# Patient Record
Sex: Male | Born: 1950
Health system: Southern US, Community
[De-identification: ages and names within clinical notes are randomized; demographics above are authoritative.]

## PROBLEM LIST (undated history)

## (undated) DIAGNOSIS — K2970 Gastritis, unspecified, without bleeding: Secondary | ICD-10-CM

## (undated) DIAGNOSIS — S62102A Fracture of unspecified carpal bone, left wrist, initial encounter for closed fracture: Secondary | ICD-10-CM

## (undated) DIAGNOSIS — R112 Nausea with vomiting, unspecified: Secondary | ICD-10-CM

## (undated) DIAGNOSIS — C4491 Basal cell carcinoma of skin, unspecified: Secondary | ICD-10-CM

## (undated) DIAGNOSIS — Z972 Presence of dental prosthetic device (complete) (partial): Secondary | ICD-10-CM

## (undated) DIAGNOSIS — Z860100 Personal history of colon polyps, unspecified: Secondary | ICD-10-CM

## (undated) DIAGNOSIS — E7800 Pure hypercholesterolemia, unspecified: Secondary | ICD-10-CM

## (undated) DIAGNOSIS — E119 Type 2 diabetes mellitus without complications: Secondary | ICD-10-CM

## (undated) DIAGNOSIS — I10 Essential (primary) hypertension: Secondary | ICD-10-CM

## (undated) DIAGNOSIS — J439 Emphysema, unspecified: Secondary | ICD-10-CM

## (undated) DIAGNOSIS — D179 Benign lipomatous neoplasm, unspecified: Secondary | ICD-10-CM

## (undated) DIAGNOSIS — K219 Gastro-esophageal reflux disease without esophagitis: Secondary | ICD-10-CM

## (undated) DIAGNOSIS — K2990 Gastroduodenitis, unspecified, without bleeding: Secondary | ICD-10-CM

## (undated) DIAGNOSIS — K2270 Barrett's esophagus without dysplasia: Secondary | ICD-10-CM

## (undated) DIAGNOSIS — S68019A Complete traumatic metacarpophalangeal amputation of unspecified thumb, initial encounter: Secondary | ICD-10-CM

## (undated) DIAGNOSIS — Z9889 Other specified postprocedural states: Secondary | ICD-10-CM

## (undated) DIAGNOSIS — F17200 Nicotine dependence, unspecified, uncomplicated: Secondary | ICD-10-CM

## (undated) HISTORY — DX: Essential (primary) hypertension: I10

## (undated) HISTORY — DX: Pure hypercholesterolemia, unspecified: E78.00

## (undated) HISTORY — DX: Basal cell carcinoma of skin, unspecified: C44.91

## (undated) HISTORY — DX: Barrett's esophagus without dysplasia: K22.70

## (undated) HISTORY — DX: Type 2 diabetes mellitus without complications: E11.9

## (undated) HISTORY — DX: Nicotine dependence, unspecified, uncomplicated: F17.200

## (undated) HISTORY — DX: Fracture of unspecified carpal bone, left wrist, initial encounter for closed fracture: S62.102A

## (undated) HISTORY — DX: Gastro-esophageal reflux disease without esophagitis: K21.9

## (undated) HISTORY — DX: Benign lipomatous neoplasm, unspecified: D17.9

## (undated) HISTORY — DX: Gastroduodenitis, unspecified, without bleeding: K29.90

## (undated) HISTORY — PX: CATARACT EXTRACTION: SUR2

## (undated) HISTORY — DX: Gastritis, unspecified, without bleeding: K29.70

## (undated) HISTORY — PX: EYE SURGERY: SHX253

## (undated) HISTORY — DX: Personal history of colon polyps, unspecified: Z86.0100

## (undated) HISTORY — PX: UPPER GI ENDOSCOPY: SHX6162

## (undated) HISTORY — DX: Complete traumatic metacarpophalangeal amputation of unspecified thumb, initial encounter: S68.019A

## (undated) HISTORY — DX: Personal history of colonic polyps: Z86.010

---
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
## 1982-05-10 HISTORY — PX: LAMINECTOMY: SHX219

## 1999-12-09 ENCOUNTER — Encounter: Payer: Self-pay | Admitting: Family Medicine

## 1999-12-09 LAB — CONVERTED CEMR LAB: PSA: 1.1 ng/mL

## 2000-05-10 DIAGNOSIS — C4491 Basal cell carcinoma of skin, unspecified: Secondary | ICD-10-CM

## 2000-05-10 HISTORY — DX: Basal cell carcinoma of skin, unspecified: C44.91

## 2002-07-09 ENCOUNTER — Encounter: Payer: Self-pay | Admitting: Family Medicine

## 2002-07-09 LAB — CONVERTED CEMR LAB: PSA: 0.6 ng/mL

## 2003-08-09 ENCOUNTER — Encounter: Payer: Self-pay | Admitting: Family Medicine

## 2003-08-09 LAB — CONVERTED CEMR LAB: PSA: 0.6 ng/mL

## 2004-07-16 ENCOUNTER — Ambulatory Visit: Payer: Self-pay | Admitting: Family Medicine

## 2004-10-08 ENCOUNTER — Encounter: Payer: Self-pay | Admitting: Family Medicine

## 2004-10-08 LAB — CONVERTED CEMR LAB: PSA: 0.86 ng/mL

## 2004-11-03 ENCOUNTER — Ambulatory Visit: Payer: Self-pay | Admitting: Family Medicine

## 2004-11-06 ENCOUNTER — Ambulatory Visit: Payer: Self-pay | Admitting: Family Medicine

## 2005-02-15 ENCOUNTER — Ambulatory Visit: Payer: Self-pay | Admitting: Unknown Physician Specialty

## 2006-01-17 ENCOUNTER — Ambulatory Visit: Payer: Self-pay | Admitting: Family Medicine

## 2006-01-21 ENCOUNTER — Ambulatory Visit: Payer: Self-pay | Admitting: Family Medicine

## 2006-02-09 ENCOUNTER — Ambulatory Visit: Payer: Self-pay | Admitting: Family Medicine

## 2006-02-25 ENCOUNTER — Ambulatory Visit: Payer: Self-pay | Admitting: Family Medicine

## 2006-11-08 ENCOUNTER — Ambulatory Visit: Payer: Self-pay | Admitting: Family Medicine

## 2006-11-08 DIAGNOSIS — R0789 Other chest pain: Secondary | ICD-10-CM | POA: Insufficient documentation

## 2006-11-08 DIAGNOSIS — K219 Gastro-esophageal reflux disease without esophagitis: Secondary | ICD-10-CM | POA: Insufficient documentation

## 2006-12-07 ENCOUNTER — Ambulatory Visit: Payer: Self-pay | Admitting: Unknown Physician Specialty

## 2006-12-20 ENCOUNTER — Ambulatory Visit: Payer: Self-pay | Admitting: General Surgery

## 2006-12-20 HISTORY — PX: CHOLECYSTECTOMY, LAPAROSCOPIC: SHX56

## 2007-01-16 ENCOUNTER — Encounter: Payer: Self-pay | Admitting: Family Medicine

## 2007-01-16 DIAGNOSIS — F17200 Nicotine dependence, unspecified, uncomplicated: Secondary | ICD-10-CM | POA: Insufficient documentation

## 2007-01-16 DIAGNOSIS — Z860100 Personal history of colon polyps, unspecified: Secondary | ICD-10-CM | POA: Insufficient documentation

## 2007-01-23 ENCOUNTER — Ambulatory Visit: Payer: Self-pay | Admitting: Family Medicine

## 2007-01-23 LAB — CONVERTED CEMR LAB
Albumin: 3.8 g/dL (ref 3.5–5.2)
Basophils Absolute: 0.1 10*3/uL (ref 0.0–0.1)
CO2: 29 meq/L (ref 19–32)
Cholesterol: 177 mg/dL (ref 0–200)
Creatinine, Ser: 1.2 mg/dL (ref 0.4–1.5)
GFR calc Af Amer: 80 mL/min
HCT: 42.4 % (ref 39.0–52.0)
Hemoglobin: 14.4 g/dL (ref 13.0–17.0)
LDL Cholesterol: 110 mg/dL — ABNORMAL HIGH (ref 0–99)
Lymphocytes Relative: 30.9 % (ref 12.0–46.0)
MCHC: 33.9 g/dL (ref 30.0–36.0)
Monocytes Absolute: 0.7 10*3/uL (ref 0.2–0.7)
Neutro Abs: 4.8 10*3/uL (ref 1.4–7.7)
Neutrophils Relative %: 54.7 % (ref 43.0–77.0)
PSA: 0.72 ng/mL (ref 0.10–4.00)
Potassium: 4.2 meq/L (ref 3.5–5.1)
RDW: 12.2 % (ref 11.5–14.6)
Sodium: 142 meq/L (ref 135–145)
TSH: 1.78 microintl units/mL (ref 0.35–5.50)
Total Bilirubin: 0.9 mg/dL (ref 0.3–1.2)
Total Protein: 6.5 g/dL (ref 6.0–8.3)

## 2007-01-25 ENCOUNTER — Ambulatory Visit: Payer: Self-pay | Admitting: Family Medicine

## 2007-02-13 ENCOUNTER — Ambulatory Visit: Payer: Self-pay | Admitting: Family Medicine

## 2007-02-14 ENCOUNTER — Encounter (INDEPENDENT_AMBULATORY_CARE_PROVIDER_SITE_OTHER): Payer: Self-pay | Admitting: *Deleted

## 2007-02-16 ENCOUNTER — Encounter: Payer: Self-pay | Admitting: Family Medicine

## 2007-05-01 ENCOUNTER — Telehealth: Payer: Self-pay | Admitting: Family Medicine

## 2007-05-02 ENCOUNTER — Ambulatory Visit: Payer: Self-pay | Admitting: Family Medicine

## 2007-05-02 DIAGNOSIS — Z8679 Personal history of other diseases of the circulatory system: Secondary | ICD-10-CM | POA: Insufficient documentation

## 2007-05-24 ENCOUNTER — Ambulatory Visit: Payer: Self-pay | Admitting: Family Medicine

## 2007-05-24 LAB — CONVERTED CEMR LAB
BUN: 17 mg/dL
CO2: 30 meq/L
Calcium: 9.1 mg/dL
Chloride: 104 meq/L
Creatinine, Ser: 1.3 mg/dL
GFR calc Af Amer: 73 mL/min
GFR calc non Af Amer: 60 mL/min
Glucose, Bld: 118 mg/dL — ABNORMAL HIGH
Potassium: 4.4 meq/L
Sodium: 139 meq/L

## 2007-06-08 ENCOUNTER — Ambulatory Visit: Payer: Self-pay | Admitting: Family Medicine

## 2007-06-08 LAB — CONVERTED CEMR LAB: BUN: 21 mg/dL (ref 6–23)

## 2007-06-14 ENCOUNTER — Ambulatory Visit: Payer: Self-pay | Admitting: Family Medicine

## 2007-09-22 ENCOUNTER — Encounter: Payer: Self-pay | Admitting: Family Medicine

## 2007-09-22 ENCOUNTER — Ambulatory Visit: Payer: Self-pay | Admitting: Unknown Physician Specialty

## 2007-09-22 LAB — HM COLONOSCOPY

## 2007-09-25 ENCOUNTER — Encounter: Payer: Self-pay | Admitting: Family Medicine

## 2008-01-25 ENCOUNTER — Ambulatory Visit: Payer: Self-pay | Admitting: Family Medicine

## 2008-01-25 LAB — CONVERTED CEMR LAB
ALT: 27 units/L (ref 0–53)
Basophils Relative: 0.6 % (ref 0.0–3.0)
Bilirubin, Direct: 0.1 mg/dL (ref 0.0–0.3)
CO2: 30 meq/L (ref 19–32)
Calcium: 9.1 mg/dL (ref 8.4–10.5)
Glucose, Bld: 118 mg/dL — ABNORMAL HIGH (ref 70–99)
Hemoglobin: 13.6 g/dL (ref 13.0–17.0)
LDL Cholesterol: 72 mg/dL (ref 0–99)
Lymphocytes Relative: 35.3 % (ref 12.0–46.0)
Microalb, Ur: 0.2 mg/dL (ref 0.0–1.9)
Monocytes Relative: 7.8 % (ref 3.0–12.0)
Neutro Abs: 3.6 10*3/uL (ref 1.4–7.7)
RBC: 4.21 M/uL — ABNORMAL LOW (ref 4.22–5.81)
RDW: 12.7 % (ref 11.5–14.6)
Sodium: 142 meq/L (ref 135–145)
Total CHOL/HDL Ratio: 3
Total Protein: 6.5 g/dL (ref 6.0–8.3)
WBC: 6.7 10*3/uL (ref 4.5–10.5)

## 2008-01-29 ENCOUNTER — Ambulatory Visit: Payer: Self-pay | Admitting: Family Medicine

## 2008-07-24 ENCOUNTER — Ambulatory Visit: Payer: Self-pay | Admitting: Family Medicine

## 2008-07-24 LAB — CONVERTED CEMR LAB
CO2: 32 meq/L (ref 19–32)
Calcium: 8.9 mg/dL (ref 8.4–10.5)
Chloride: 105 meq/L (ref 96–112)
Glucose, Bld: 112 mg/dL — ABNORMAL HIGH (ref 70–99)
Potassium: 4.9 meq/L (ref 3.5–5.1)
Sodium: 141 meq/L (ref 135–145)

## 2008-07-29 ENCOUNTER — Ambulatory Visit: Payer: Self-pay | Admitting: Family Medicine

## 2009-01-27 ENCOUNTER — Ambulatory Visit: Payer: Self-pay | Admitting: Family Medicine

## 2009-01-27 LAB — CONVERTED CEMR LAB
BUN: 18 mg/dL (ref 6–23)
Bilirubin, Direct: 0.1 mg/dL (ref 0.0–0.3)
CO2: 29 meq/L (ref 19–32)
Chloride: 108 meq/L (ref 96–112)
Cholesterol: 146 mg/dL (ref 0–200)
Creatinine, Ser: 1.3 mg/dL (ref 0.4–1.5)
Creatinine,U: 99.5 mg/dL
Eosinophils Absolute: 0.2 10*3/uL (ref 0.0–0.7)
Glucose, Bld: 124 mg/dL — ABNORMAL HIGH (ref 70–99)
HDL: 37.7 mg/dL — ABNORMAL LOW (ref >39.00)
LDL Cholesterol: 89 mg/dL (ref 0–99)
Lymphs Abs: 2.4 10*3/uL (ref 0.7–4.0)
MCHC: 33.3 g/dL (ref 30.0–36.0)
MCV: 96.1 fL (ref 78.0–100.0)
Monocytes Absolute: 0.6 10*3/uL (ref 0.1–1.0)
Neutrophils Relative %: 56.6 % (ref 43.0–77.0)
Platelets: 170 10*3/uL (ref 150.0–400.0)
RDW: 12.6 % (ref 11.5–14.6)
Total Bilirubin: 0.9 mg/dL (ref 0.3–1.2)
Total CHOL/HDL Ratio: 4
Triglycerides: 97 mg/dL (ref 0.0–149.0)

## 2009-01-29 ENCOUNTER — Ambulatory Visit: Payer: Self-pay | Admitting: Family Medicine

## 2009-07-16 ENCOUNTER — Ambulatory Visit: Payer: Self-pay | Admitting: Family Medicine

## 2009-07-16 LAB — CONVERTED CEMR LAB: Glucose, Bld: 121 mg/dL — ABNORMAL HIGH (ref 70–99)

## 2009-07-21 ENCOUNTER — Ambulatory Visit: Payer: Self-pay | Admitting: Family Medicine

## 2009-12-11 ENCOUNTER — Encounter (INDEPENDENT_AMBULATORY_CARE_PROVIDER_SITE_OTHER): Payer: Self-pay | Admitting: *Deleted

## 2010-01-20 ENCOUNTER — Telehealth (INDEPENDENT_AMBULATORY_CARE_PROVIDER_SITE_OTHER): Payer: Self-pay | Admitting: *Deleted

## 2010-01-22 ENCOUNTER — Ambulatory Visit: Payer: Self-pay | Admitting: Family Medicine

## 2010-01-22 DIAGNOSIS — R5381 Other malaise: Secondary | ICD-10-CM | POA: Insufficient documentation

## 2010-01-22 DIAGNOSIS — R5383 Other fatigue: Secondary | ICD-10-CM

## 2010-01-23 LAB — CONVERTED CEMR LAB
AST: 22 units/L (ref 0–37)
Albumin: 4 g/dL (ref 3.5–5.2)
Alkaline Phosphatase: 39 units/L (ref 39–117)
Basophils Relative: 0.5 % (ref 0.0–3.0)
Bilirubin, Direct: 0.2 mg/dL (ref 0.0–0.3)
Calcium: 9.1 mg/dL (ref 8.4–10.5)
Eosinophils Relative: 2.4 % (ref 0.0–5.0)
GFR calc non Af Amer: 67.42 mL/min (ref >60)
Glucose, Bld: 109 mg/dL — ABNORMAL HIGH (ref 70–99)
HDL: 32.9 mg/dL — ABNORMAL LOW (ref >39.00)
LDL Cholesterol: 95 mg/dL (ref 0–99)
Lymphocytes Relative: 30 % (ref 12.0–46.0)
MCV: 94.7 fL (ref 78.0–100.0)
Monocytes Absolute: 0.8 10*3/uL (ref 0.1–1.0)
Monocytes Relative: 9.9 % (ref 3.0–12.0)
Neutrophils Relative %: 57.2 % (ref 43.0–77.0)
PSA: 0.82 ng/mL (ref 0.10–4.00)
Platelets: 170 10*3/uL (ref 150.0–400.0)
Potassium: 4.9 meq/L (ref 3.5–5.1)
RBC: 4.45 M/uL (ref 4.22–5.81)
Sodium: 143 meq/L (ref 135–145)
TSH: 1.27 microintl units/mL (ref 0.35–5.50)
Total CHOL/HDL Ratio: 4
VLDL: 12.4 mg/dL (ref 0.0–40.0)
WBC: 7.9 10*3/uL (ref 4.5–10.5)

## 2010-02-02 ENCOUNTER — Ambulatory Visit: Payer: Self-pay | Admitting: Family Medicine

## 2010-04-06 ENCOUNTER — Ambulatory Visit: Payer: Self-pay | Admitting: Family Medicine

## 2010-04-06 DIAGNOSIS — R05 Cough: Secondary | ICD-10-CM

## 2010-04-06 DIAGNOSIS — R059 Cough, unspecified: Secondary | ICD-10-CM | POA: Insufficient documentation

## 2010-04-10 ENCOUNTER — Telehealth: Payer: Self-pay | Admitting: Family Medicine

## 2010-04-20 ENCOUNTER — Telehealth: Payer: Self-pay | Admitting: Family Medicine

## 2010-06-09 NOTE — Progress Notes (Signed)
Summary: has bad cough  Phone Note Call from Patient Call back at Home Phone [phone]   Caller: Spouse Call For: Crawford Givens MD Summary of Call: Pt was seen for URI and given z-pack.  He wil finish that today.  He still has a low grade fever and is coughing badly at night.  I advised his wife that the zithromax will continue to work in his system for a few more days and he can expect the cough to linger for awhile.  I suggested that they give it the weekend and if he is not any better and continues to have fevers to call back on monday.  Wife is asking if something can be called in for his cough, uses [facility] in [place]. Initial call taken by: Lowella Petties CMA, AAMA,  April 10, 2010 10:09 AM  Follow-up for Phone Call        please call in hycodan/homatropine 5ml by mouth q6h as needed cough, sedation caution.  #6oz, no rf.  Follow-up by: Crawford Givens MD,  April 10, 2010 1:54 PM  Additional Follow-up for Phone Call Additional follow up Details #1::        Medication phoned to pharmacy. Patient Advised.  Additional Follow-up by: Delilah Shan CMA (AAMA),  April 10, 2010 3:55 PM    New/Updated Medications: HYDROMET 5-1.5 MG/5ML SYRP (HYDROCODONE-HOMATROPINE) 5ml by mouth every 6 hours  as needed for cough. ( sedation caution.) Prescriptions: HYDROMET 5-1.5 MG/5ML SYRP (HYDROCODONE-HOMATROPINE) 5ml by mouth every 6 hours  as needed for cough. ( sedation caution.)  #6 oz. x 0   Entered by:   Delilah Shan CMA (AAMA)   Authorized by:   Crawford Givens MD   Signed by:   Delilah Shan CMA (AAMA) on 04/10/2010   Method used:   Handwritten   RxID:   [id number]

## 2010-06-09 NOTE — Letter (Signed)
Summary: Nadara Eaton letter  Brian Hull at [facility]  [address] [place], [place] [address]   Phone: [phone]  Fax: [phone]       12/11/2009 MRN: [id number]  Brian Hull [address] [place], [place]  [address]  Dear [name] [facility] [place] [place] announce the retirement of Brian Hull, M.D., from full-time practice at the [facility] office effective November 06, 2009 and his plans of returning part-time.  It is important to Dr. Hetty Hull and to our practice that you understand that [facility] has seven physicians in our office for your health care needs.  We will continue to offer the same exceptional care that you have today.    Dr. Hetty Hull has spoken to many of you about his plans for retirement and returning part-time in the fall.   We will continue to work with you through the transition to schedule appointments for you in the office and meet the high standards that [facility] is committed to.   Again, it is with great pleasure that we share the news that Dr. Hetty Hull will return to [facility] at [facility] in October of 2011 with a reduced schedule.    If you have any questions, or would like to request an appointment with one of our physicians, please call us at [phone] and press the option for Scheduling an appointment.  We take pleasure in providing you with excellent patient care and look forward to seeing you at your next office visit.  Our [facility] Physicians are:  Brian Hull, M.D. Brian Hull, M.D. Brian Hull, M.D. Brian Hull, M.D. Brian Hull, M.D. Brian Hull, M.D. We proudly welcomed Brian Hull, M.D. and Brian Hull, M.D. to the practice in July/August 2011.  Sincerely,  [place] [facility]

## 2010-06-09 NOTE — Progress Notes (Signed)
----   Converted from flag ---- ---- 01/19/2010 12:16 PM, Hannah Beat MD wrote: Prephysical Labs, several days before, fasting BMP, HFP, FLP, CBC with diff, TSH, PSA: v77.91, v77.1, ,780.79, v76.44   ---- 01/19/2010 11:32 AM, Liane Comber CMA (AAMA) wrote: Lab orders please! Good Morning! This pt is scheduled for cpx labs [facility], which labs to draw and dx codes to use? Thanks Tasha ------------------------------

## 2010-06-09 NOTE — Assessment & Plan Note (Signed)
Summary: CPX... CYD   Vital Signs:  Patient profile:   60 year old male Height:      71 inches Weight:      213.0 pounds BMI:     29.81 Temp:     98.0 degrees F oral Pulse rate:   84 / minute Pulse rhythm:   regular BP sitting:   138 / 80  (left arm) Cuff size:   large  Vitals Entered By: Benny Lennert CMA Duncan Dull) (February 02, 2010 2:39 PM)  History of Present Illness: Chief complaint cpx  60 year old male:  Flu     Preventive Screening-Counseling & Management  Alcohol-Tobacco     Alcohol drinks/day: 2     Alcohol type: wine at night     Smoking Status: current     Smoking Cessation Counseling: yes     Packs/Day: 3/4     Year Started: 1965     Cans of tobacco/week: no     Passive Smoke Exposure: no  Caffeine-Diet-Exercise     Caffeine use/day: 4     Does Patient Exercise: no     Type of exercise: works around [employer], no gym  Hep-HIV-STD-Contraception     HIV Risk: no  Safety-Violence-Falls     Risk analyst Use: 100      Drug Use:  no.    Clinical Review Panels:  Prevention   Last Colonoscopy:  Adenomatous Polyp (09/22/2007)   Last PSA:  0.82 (01/22/2010)  Immunizations   Last Tetanus Booster:  Tdap (01/25/2007)  Lipid Management   Cholesterol:  140 (01/22/2010)   LDL (bad choesterol):  95 (01/22/2010)   HDL (good cholesterol):  32.90 (01/22/2010)  Diabetes Management   HgBA1C:  5.9 (11/03/2004)   Creatinine:  1.2 (01/22/2010)  CBC   WBC:  7.9 (01/22/2010)   RBC:  4.45 (01/22/2010)   Hgb:  14.3 (01/22/2010)   Hct:  42.1 (01/22/2010)   Platelets:  170.0 (01/22/2010)   MCV  94.7 (01/22/2010)   MCHC  33.9 (01/22/2010)   RDW  13.2 (01/22/2010)   PMN:  57.2 (01/22/2010)   Lymphs:  30.0 (01/22/2010)   Monos:  9.9 (01/22/2010)   Eosinophils:  2.4 (01/22/2010)   Basophil:  0.5 (01/22/2010)  Complete Metabolic Panel   Glucose:  109 (01/22/2010)   Sodium:  143 (01/22/2010)   Potassium:  4.9 (01/22/2010)   Chloride:  106 (01/22/2010)  CO2:  30 (01/22/2010)   BUN:  17 (01/22/2010)   Creatinine:  1.2 (01/22/2010)   Albumin:  4.0 (01/22/2010)   Total Protein:  6.6 (01/22/2010)   Calcium:  9.1 (01/22/2010)   Total Bili:  0.4 (01/22/2010)   Alk Phos:  39 (01/22/2010)   SGPT (ALT):  27 (01/22/2010)   SGOT (AST):  22 (01/22/2010)   Allergies (verified): No Known Drug Allergies  Past History:  Past medical, surgical, family and social histories (including risk factors) reviewed, and no changes noted (except as noted below).  Past Medical History: Reviewed history from 01/16/2007 and no changes required. Colonic polyps, hx of  Past Surgical History: Reviewed history from 01/29/2008 and no changes required. Surgery for trauma to R eye  as child  growth removed from eye 2 yrs later L wrist fx as child Tip of R Thumb traumatically amputated 1968 Laminectomy L4/5 1984 Basal Cell of Nose 05/2000 Colonoscopy Marina Goodell) 12/00 Colonoscopoy polyps, int hems  Mechele Collin) 05/08/02 EGD, Barrett's Esoph, H. H., gastritis/divertics 05/08/02 Colonoscopy, polyps, , int hems 02/15/05 EGD Barrett's, sm H. H., gastritis,  wnl duod  02/15/05 (2009) Cholecystectomy, lap Okey Dupre) 12/20/06 Colonoscopy Polyps Int Hemms (Dr Mechele Collin) 09/22/2007    3 years. EGD ?Barrett's Esoph HH Gastritis (Dr Mechele Collin) 09/22/2007  Family History: Reviewed history from 01/29/2009 and no changes required. Father: dec 74 with colon cancer, DM, CHF, stroke, and hypertension Mother: A 4  DM, heart disease MI, Parkinson's, dizziness, and Alzheimers progressing  Brother dec 59 Lung Ca (smoker) DM  and polyps Sister A 62   HTN  polyps Sister A 60   HTN  polyps CV + Father, CHF HBP + Sister, Father DM + Mother, Brother, Father Colon Canc er + Father Depression - negative ETOH/Drug abuse - negative Stroke + Father  Social History: Reviewed history from 01/25/2007 and no changes required. 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  Cardiovascular: Denies chest pains, palpitations, syncope, dyspnea on exertion,  Respiratory: Denies cough, dyspnea at rest, excessive sputum,wheeezing GI: Denies nausea, vomiting, diarrhea, constipation, change in bowel habits, abdominal pain, melena, hematochezia GU: Denies dysuria, hematuria, discharge, urinary frequency, urinary hesitancy, nocturia, incontinence, genital sores, decreased libido - BACK AND FLANK PAIN A FEW DAYS AGO, BUT ONLY LASTED A FEW DAYS Musculoskeletal: Denies back pain, joint pain Derm: Denies rash, itching Neuro: Denies  paresthesias, frequent falls, frequent headaches, and difficulty walking.  Psych: Denies depression, anxiety Endocrine: Denies cold intolerance, heat intolerance, polydipsia, polyphagia, polyuria, and unusual weight change.  Heme: Denies enlarged lymph nodes Allergy: No hayfever   Otherwise, the pertinent positives and negatives are listed above and in the HPI, otherwise a full review of systems has been reviewed and is negative unless noted positive.   Physical Exam  General:  Well-developed,well-nourished,in no acute distress; alert,appropriate and cooperative throughout examination Head:  Normocephalic and atraumatic without obvious abnormalities. No apparent alopecia or balding. Eyes:  pupils equal, pupils round, and pupils reactive to light.   Ears:  External ear exam shows no significant lesions or deformities.  Otoscopic examination reveals clear canals, tympanic membranes are intact bilaterally without bulging, retraction, inflammation or discharge. Hearing is grossly normal bilaterally. Nose:  External nasal examination shows no deformity or inflammation. Nasal  mucosa are pink and moist without lesions or exudates. Mouth:  no gingival abnormalities and pharynx pink and moist.   Neck:  No deformities, masses, or tenderness noted. Chest Wall:  No deformities, masses, tenderness or gynecomastia noted. Lungs:  Normal respiratory effort, chest expands symmetrically. Lungs are clear to auscultation, no crackles or wheezes. Heart:  Normal rate and regular rhythm. S1 and S2 normal without gallop, murmur, click, rub or other extra sounds. Abdomen:  Bowel sounds positive,abdomen soft and non-tender without masses, organomegaly or hernias noted. Rectal:  No external abnormalities noted. Normal sphincter tone. No rectal masses or tenderness. Genitalia:  Testes bilaterally descended without nodularity, tenderness or masses. No scrotal masses or lesions. No penis lesions or urethral discharge. Prostate:  Prostate gland firm and smooth, no enlargement, nodularity, tenderness, mass, asymmetry or induration. Msk:  normal ROM.   Extremities:  No clubbing, cyanosis, edema, or deformity noted with normal full range of motion of all joints.   Neurologic:  alert & oriented X3 and gait normal.   Skin:  Intact without suspicious lesions or rashes Cervical Nodes:  No lymphadenopathy noted  Psych:  Cognition and judgment appear intact. Alert and cooperative with normal attention span and concentration. No apparent delusions, illusions, hallucinations   Impression & Recommendations:  Problem # 1:  HEALTH MAINTENANCE EXAM (ICD-V70.0) The patient's preventative maintenance and recommended screening tests for an annual wellness exam were reviewed in full today. Brought up to date unless services declined.  Counselled on the importance of diet, exercise, and its role in overall health and mortality. The patient's FH and SH was reviewed, including their home life, tobacco status, and drug and alcohol status.   doing well  The patient does smoke cigarettes, and we discussed that  tobacco is harmful to one's overall health, and that likely quitting smoking would be the single best thing that they could do for their health.   cont f/u Dr. Hetty Ely for longitudinal care  Complete Medication List: 1)  Protonix 40 Mg Tbec (Pantoprazole sodium) .Marland Kitchen.. 1 tab by mouth daily 2)  Lisinopril 20 Mg Tabs (Lisinopril) .... One tab by mouth at night.  Contraindications/Deferment of Procedures/Staging:    Test/Procedure: FLU VAX    Reason for deferment: patient declined   Current Allergies (reviewed today): No known allergies     Prevention & Chronic Care Immunizations   Influenza vaccine: Not documented   Influenza vaccine deferral: patient declined  (02/02/2010)    Tetanus booster: 01/25/2007: Tdap   Tetanus booster due: 01/24/2017    Pneumococcal vaccine: Not documented    H. zoster vaccine: Not documented  Colorectal Screening   Hemoccult: Negative  (02/04/2006)   Hemoccult due: 02/05/2007    Colonoscopy: Adenomatous Polyp  (09/22/2007)   Colonoscopy due: 09/22/2010  Other Screening   PSA: 0.82  (01/22/2010)   PSA due due: 01/23/2011   Smoking status: current  (02/02/2010)   Smoking cessation counseling: yes  (02/02/2010)  Lipids   Total Cholesterol: 140  (01/22/2010)   LDL: 95  (01/22/2010)   LDL Direct: Not documented   HDL: 32.90  (01/22/2010)   Triglycerides: 62.0  (01/22/2010)    SGOT (AST): 22  (01/22/2010)   SGPT (ALT): 27  (01/22/2010)   Alkaline phosphatase: 39  (01/22/2010)   Total bilirubin: 0.4  (01/22/2010)    Lipid flowsheet reviewed?: Yes   Progress toward LDL goal: At goal  Hypertension   Last Blood Pressure: 138 / 80  (02/02/2010)   Serum creatinine: 1.2  (01/22/2010)   Serum potassium 4.9  (01/22/2010)    Hypertension flowsheet reviewed?: Yes   Progress toward BP goal: At goal

## 2010-06-09 NOTE — Assessment & Plan Note (Signed)
Summary: RTC 6 MOS, AC   Vital Signs:  Patient profile:   60 year old male Weight:      226 pounds BMI:     31.63 Temp:     98.1 degrees F oral Pulse rate:   84 / minute Pulse rhythm:   regular BP sitting:   118 / 72  (left arm) Cuff size:   large  Vitals Entered By: Sydell Axon LPN (July 21, 2009 10:09 AM) CC: 6 Month follow-up after lab work   History of Present Illness: Pt here for 6 month followup, has elevasted sugar and has been encouraged to watch his diet. He is here to check on success of same and ability to cut down on smoking and chewing. He has not been very successful at any of this and knows what he needs to do, it is getting his mind around it and doing it that is hard. He also complains of a superficial; swelling ove rthe right mid back area, over the lower ribs posteriorally. He feels well otherwise.  Problems Prior to Update: 1)  Essential Hypertension, Benign  (ICD-401.1) 2)  Screening For Malignannt Neoplasm, Site Nec  (ICD-V76.49) 3)  Health Maintenance Exam  (ICD-V70.0) 4)  Screening For Malignant Neoplasm, Prostate  (ICD-V76.44) 5)  Gastritis,esophagitis Via Egd  (ICD-535.50) 6)  Hypercholesterolemia, Deceased Hdl, Ldl 101, Hdl 31.5  (ICD-272.0) 7)  Hyperglycemia, 116  (ICD-790.29) 8)  Barrett's Esophagus Via Egd  (ICD-530.85) 9)  Nicotine Addiction  (ICD-305.1) 10)  Colonic Polyps, Hx of  (ICD-V12.72) 11)  Gerd, Severe  (ICD-530.81) 12)  Chest Pain, Atypical  (ICD-786.59)  Medications Prior to Update: 1)  Protonix 40 Mg  Tbec (Pantoprazole Sodium) .Marland Kitchen.. 1 Tab By Mouth Daily 2)  Lisinopril 20 Mg Tabs (Lisinopril) .... One Tab By Mouth At Night.  Allergies: No Known Drug Allergies  Physical Exam  General:  Well-developed,well-nourished,in no acute distress; alert,appropriate and cooperative throughout examination, mildly heavy. Head:  Normocephalic and atraumatic without obvious abnormalities. No apparent alopecia or balding. Eyes:  Conjunctiva  clear bilaterally.  Ears:  External ear exam shows no significant lesions or deformities.  Otoscopic examination reveals clear canals, tympanic membranes are intact bilaterally without bulging, retraction, inflammation or discharge. Hearing is grossly normal bilaterally. Nose:  External nasal examination shows no deformity or inflammation. Nasal mucosa are pink and moist without lesions or exudates. Mouth:  Oral mucosa and oropharynx without lesions or exudates.  Teeth in good repair. Neck:  No deformities, masses, or tenderness noted. Lungs:  Normal respiratory effort, chest expands symmetrically. Lungs are clear to auscultation, no crackles, very fine occas wheezes. Heart:  Normal rate and regular rhythm. S1 and S2 normal without gallop, murmur, click, rub or other extra sounds. Msk:  10-15cm soft swelling over the right lower ribs posteriorally, mobile and nontender unless he is stretching.   Impression & Recommendations:  Problem # 1:  LIPOMA, RIGHT POSTERIOR LOWER CHEST. (ICD-214.9) Assessment New Declines surgiccal referral at present . Will return if enlarges.  Problem # 2:  ESSENTIAL HYPERTENSION, BENIGN (ICD-401.1) Assessment: Unchanged Stable. His updated medication list for this problem includes:    Lisinopril 20 Mg Tabs (Lisinopril) ..... One tab by mouth at night.  BP today: 118/72 Prior BP: 110/64 (01/29/2009)  Labs Reviewed: K+: 4.8 (01/27/2009) Creat: : 1.3 (01/27/2009)   Chol: 146 (01/27/2009)   HDL: 37.70 (01/27/2009)   LDL: 89 (01/27/2009)   TG: 97.0 (01/27/2009)  Problem # 3:  HYPERGLYCEMIA, 116 (ICD-790.29) Assessment: Unchanged Stable but  significant risk of accelerating to DM. Discussed risks of dz. Labs Reviewed: Creat: 1.3 (01/27/2009)     Problem # 4:  NICOTINE ADDICTION (ICD-305.1) Assessment: Unchanged Disscussed need to quit and interaction of smoking and DM.  Complete Medication List: 1)  Protonix 40 Mg Tbec (Pantoprazole sodium) .Marland Kitchen.. 1 tab by  mouth daily 2)  Lisinopril 20 Mg Tabs (Lisinopril) .... One tab by mouth at night.  Patient Instructions: 1)  Will return for lipoma/cyst of right middle back.

## 2010-06-09 NOTE — Assessment & Plan Note (Signed)
Summary: COUGH,CONGESTION/ALC   Vital Signs:  Patient profile:   60 year old male Height:      71 inches Weight:      213.25 pounds BMI:     29.85 Temp:     98.3 degrees F oral Pulse rate:   92 / minute Pulse rhythm:   regular BP sitting:   104 / 68  (left arm) Cuff size:   large  Vitals Entered By: Delilah Shan CMA Duncan Dull) (April 06, 2010 12:14 PM) CC: Cough, congestion, fever   History of Present Illness: Sx started 8 days ago.  Progressively felt worse.  Cough, rhinorrhea, cougestion in chest (h/o likely early COPD per patient- had "flunked" spirometry with fire department).  temp 101 yesterday.  Dec in appetite and weight over the last few days.  HA.  +Sick contacts before symptoms started.  Post-tussive emesis.   Allergies: No Known Drug Allergies  Past History:  Past Medical History: COUGH (ICD-786.2) FATIGUE (ICD-780.79) SCREENING FOR LIPOID DISORDERS (ICD-V77.91) SCREENING, DIABETES MELLITUS (ICD-V77.1) LIPOMA, RIGHT POSTERIOR LOWER CHEST. (ICD-214.9) ESSENTIAL HYPERTENSION, BENIGN (ICD-401.1) SCREENING FOR MALIGNANNT NEOPLASM, SITE NEC (ICD-V76.49) HEALTH MAINTENANCE EXAM (ICD-V70.0) SCREENING FOR MALIGNANT NEOPLASM, PROSTATE (ICD-V76.44) GASTRITIS,ESOPHAGITIS VIA EGD (ICD-535.50) HYPERCHOLESTEROLEMIA, DECEASED HDL, LDL 101, HDL 31.5 (ICD-272.0) HYPERGLYCEMIA, 116 (ICD-790.29) BARRETT'S ESOPHAGUS VIA EGD (ICD-530.85) NICOTINE ADDICTION (ICD-305.1) COLONIC POLYPS, HX OF (ICD-V12.72) GERD, SEVERE (ICD-530.81) CHEST PAIN, ATYPICAL (ICD-786.59)  Social History: Reviewed history from 01/25/2007 and no changes required. 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 smoker as of 2011 (since 64)  Physical Exam  General:  GEN: nad, alert and oriented HEENT: mucous membranes moist, TM w/o erythema, nasal epithelium injected, OP with mild  cobblestoning NECK: supple w/o LA CV: rrr. PULM:  no inc wob but diffuse wheeze and ronchi, both improved with SABA use here in clinic.    Impression & Recommendations:  Problem # 1:  COUGH (ICD-786.2) Likely acute bronchitis/COPD exacerbation.  Would use antibiotics today and continue with SABA.  Samples of SABA given with routine teaching.  Pt felt better after SABA use.  His wheeze was much improved and he may not need steroids in the meantime.  I talked with him about smoking.  Fu as needed.  Routine supportive tx o/w.  He agrees.  Complete Medication List: 1)  Protonix 40 Mg Tbec (Pantoprazole sodium) .Marland Kitchen.. 1 tab by mouth daily 2)  Lisinopril 20 Mg Tabs (Lisinopril) .... One tab by mouth at night. 3)  Ventolin Hfa 108 (90 Base) Mcg/act Aers (Albuterol sulfate) .... 2 puffs q4h as needed cough 4)  Zithromax 250 Mg Tabs (Azithromycin) .... 2 by mouth today and then 1 by mouth once daily for 4 days.  Patient Instructions: 1)  Use the inhaler every 4 hours as needed. Start the antibiotics today and try to get some rest.  Take the mucinex and cough syrup as needed.  Drink plenty of fluids.  Let us know if you aren't getting better.   Prescriptions: ZITHROMAX 250 MG TABS (AZITHROMYCIN) 2 by mouth today and then 1 by mouth once daily for 4 days.  #6 x 0   Entered and Authorized by:   Crawford Givens MD   Signed by:   Crawford Givens MD on 04/06/2010   Method used:   Electronically to        [facility] (retail)       [address]  [place], [place]  [address]       Ph: [phone]       Fax: [phone]   RxID:   [id number]    Orders Added: 1)  Est. Patient Level III [401[address]]    Current Allergies (reviewed today): No known allergies

## 2010-06-11 NOTE — Progress Notes (Signed)
Summary: Still has cough  Phone Note Call from Patient Call back at Home Phone [phone]   Caller: Spouse Call For: Crawford Givens MD Summary of Call: Patient was seen about 2 weeks ago and was given medication. Patient still has a productive cough/white-clear and head congestion with some blood in the mucus from the left nostril, no fever, some SOB,  but patient had this before getting sick because of his COPD.Patient is having a hard time sleeping because of  the cough.  Patient wants to know if he should be seen again or can you call in more medication/antibiotic?   Pharmacy-[facility]/[place]. Initial call taken by: Sydell Axon LPN,  April 20, 2010 9:25 AM  Follow-up for Phone Call        If he doesn't have a fever and is not geting more short of breath, then I wouldn't restart the antibiotics.  See if the cough medicine helped.  If it didn't (and he just ran out), then please call in some more (hycodan/homatropine 5ml by mouth q6h as needed cough, sedation caution.  #6oz, no rf. ).  If the syrup never helped at all, don't call it in and let me know.  If progressivelyl short of breath or if he gets another fever, then he needs OV.  thanks.  Follow-up by: Crawford Givens MD,  April 20, 2010 9:40 AM  Additional Follow-up for Phone Call Additional follow up Details #1::        No fever, not getting more short of breath.  He says he is definitely improving but he wakes up around 2 or 3 a.m., coughing with every breath.  He gets up for a while and then it finally gets better.  Refill on Hycodan/Homatropine called in to pharmacy.  Patient advised to make appt. for follow up if not improving later this week. Additional Follow-up by: Delilah Shan CMA Duncan Dull),  April 20, 2010 11:44 AM

## 2010-10-22 ENCOUNTER — Encounter: Payer: Self-pay | Admitting: Family Medicine

## 2010-10-23 ENCOUNTER — Ambulatory Visit (INDEPENDENT_AMBULATORY_CARE_PROVIDER_SITE_OTHER)
Admission: RE | Admit: 2010-10-23 | Discharge: 2010-10-23 | Disposition: A | Discharge status: Home / Self Care | Payer: BC Managed Care – PPO | Source: Ambulatory Visit | Attending: Family Medicine | Admitting: Family Medicine

## 2010-10-23 ENCOUNTER — Ambulatory Visit (INDEPENDENT_AMBULATORY_CARE_PROVIDER_SITE_OTHER): Payer: BC Managed Care – PPO | Admitting: Family Medicine

## 2010-10-23 ENCOUNTER — Encounter: Payer: Self-pay | Admitting: Family Medicine

## 2010-10-23 VITALS — BP 130/80 | HR 64 | Temp 98.4°F | Wt 225.0 lb

## 2010-10-23 DIAGNOSIS — J449 Chronic obstructive pulmonary disease, unspecified: Secondary | ICD-10-CM

## 2010-10-23 MED ORDER — ALBUTEROL SULFATE HFA 108 (90 BASE) MCG/ACT IN AERS: 2.0000 | INHALATION_SPRAY | RESPIRATORY_TRACT | Status: DC | PRN

## 2010-10-23 MED ORDER — FLUTICASONE-SALMETEROL 250-50 MCG/DOSE IN AEPB: 1.0000 | INHALATION_SPRAY | Freq: Two times a day (BID) | RESPIRATORY_TRACT | Status: DC

## 2010-10-23 NOTE — Progress Notes (Signed)
Colonoscopy was put off due to cough- was seen on Monday by GI.  Here today for eval.   Prev quit smoking for a few months (used nicorette with effect) and then started back 2 months ago.  Likely COPD with wheezing and cough.  Sputum- clear, more in AM.  He was better off the cigarettes.  He's interested in getting off.  Now with some sob with exertion since he started back.  Not on any inhalers.  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    Tenitive plan for colonoscopy in 11/2010.    Meds, vitals, and allergies reviewed.   ROS: See HPI.  Otherwise, noncontributory.  nad ncat Tm wnl Mmm rrr Diffuse exp wheeze w/o focal dec in BS abd soft Ext well perfused

## 2010-10-23 NOTE — Patient Instructions (Signed)
Use the advair (purple disk) twice a day.  Rinse after use.   Use the ventolin if you need it for cough/wheeze. Call me with an update in 2 weeks.  We'll make plans about your GI appointment.   Take care.

## 2010-10-25 NOTE — Assessment & Plan Note (Signed)
cxr reviewed.  H/o sig smoke exposure.  D/w pt about cessation.  Start bid advair with routine instructions and prn SABA. He'll call back with update.  He agrees and understands.  97% RA and okay for outpatient fu.  >25 min spent with face to face with patient >50% counseling.  Hopefully he'll be under better control and lower risk for the GI eval.

## 2010-11-09 ENCOUNTER — Telehealth: Payer: Self-pay | Admitting: *Deleted

## 2010-11-09 MED ORDER — FLUTICASONE-SALMETEROL 250-50 MCG/DOSE IN AEPB: 1.0000 | INHALATION_SPRAY | Freq: Two times a day (BID) | RESPIRATORY_TRACT | Status: DC

## 2010-11-09 NOTE — Telephone Encounter (Signed)
Patient called to give you an update. He says that his cough and wheezing has completely cleared up and is doing much better.

## 2010-11-09 NOTE — Telephone Encounter (Signed)
Pt wasn't in.  I'll call back later.

## 2010-11-09 NOTE — Telephone Encounter (Signed)
I called pt.  No cough.  No sputum.  No wheeze.  Able to work out in the heat w/o trouble.  He quit smoking.  Using advair bid.  Using albuterol prn, is trying to cut down on that.  We talked about that.  He's going to see GI in the near future.  He needs advair rx to [facility].  He'll call back as needed.

## 2010-12-08 ENCOUNTER — Ambulatory Visit: Payer: Self-pay | Admitting: Unknown Physician Specialty

## 2010-12-08 LAB — HM COLONOSCOPY

## 2010-12-11 LAB — PATHOLOGY REPORT

## 2010-12-13 ENCOUNTER — Encounter: Payer: Self-pay | Admitting: Family Medicine

## 2010-12-13 DIAGNOSIS — K2270 Barrett's esophagus without dysplasia: Secondary | ICD-10-CM | POA: Insufficient documentation

## 2010-12-15 ENCOUNTER — Encounter: Payer: Self-pay | Admitting: Family Medicine

## 2010-12-29 ENCOUNTER — Encounter: Payer: Self-pay | Admitting: Family Medicine

## 2010-12-31 ENCOUNTER — Encounter: Payer: Self-pay | Admitting: Family Medicine

## 2011-01-11 ENCOUNTER — Other Ambulatory Visit: Payer: Self-pay | Admitting: Family Medicine

## 2011-01-11 DIAGNOSIS — I10 Essential (primary) hypertension: Secondary | ICD-10-CM

## 2011-01-27 ENCOUNTER — Other Ambulatory Visit (INDEPENDENT_AMBULATORY_CARE_PROVIDER_SITE_OTHER): Payer: BC Managed Care – PPO

## 2011-01-27 DIAGNOSIS — I10 Essential (primary) hypertension: Secondary | ICD-10-CM

## 2011-01-27 LAB — LIPID PANEL
Cholesterol: 137 mg/dL (ref 0–200)
HDL: 45.2 mg/dL (ref >39.00)
VLDL: 19.4 mg/dL (ref 0.0–40.0)

## 2011-01-27 LAB — COMPREHENSIVE METABOLIC PANEL
Albumin: 3.9 g/dL (ref 3.5–5.2)
Alkaline Phosphatase: 35 U/L — ABNORMAL LOW (ref 39–117)
BUN: 19 mg/dL (ref 6–23)
Calcium: 8.9 mg/dL (ref 8.4–10.5)
GFR: 62.84 mL/min (ref >60.00)
Glucose, Bld: 141 mg/dL — ABNORMAL HIGH (ref 70–99)
Potassium: 4.1 mEq/L (ref 3.5–5.1)

## 2011-01-30 ENCOUNTER — Other Ambulatory Visit: Payer: Self-pay | Admitting: Family Medicine

## 2011-01-30 DIAGNOSIS — R739 Hyperglycemia, unspecified: Secondary | ICD-10-CM

## 2011-02-04 ENCOUNTER — Encounter: Payer: Self-pay | Admitting: Family Medicine

## 2011-02-05 ENCOUNTER — Other Ambulatory Visit (INDEPENDENT_AMBULATORY_CARE_PROVIDER_SITE_OTHER): Payer: BC Managed Care – PPO

## 2011-02-05 DIAGNOSIS — R739 Hyperglycemia, unspecified: Secondary | ICD-10-CM

## 2011-02-05 DIAGNOSIS — R7309 Other abnormal glucose: Secondary | ICD-10-CM

## 2011-02-05 LAB — HEMOGLOBIN A1C: Hgb A1c MFr Bld: 6.7 % — ABNORMAL HIGH (ref 4.6–6.5)

## 2011-02-05 LAB — GLUCOSE, RANDOM: Glucose, Bld: 147 mg/dL — ABNORMAL HIGH (ref 70–99)

## 2011-02-12 ENCOUNTER — Encounter: Payer: Self-pay | Admitting: Family Medicine

## 2011-02-12 ENCOUNTER — Ambulatory Visit (INDEPENDENT_AMBULATORY_CARE_PROVIDER_SITE_OTHER): Payer: BC Managed Care – PPO | Admitting: Family Medicine

## 2011-02-12 VITALS — BP 122/62 | HR 88 | Temp 98.5°F | Wt 233.0 lb

## 2011-02-12 DIAGNOSIS — I10 Essential (primary) hypertension: Secondary | ICD-10-CM

## 2011-02-12 DIAGNOSIS — E119 Type 2 diabetes mellitus without complications: Secondary | ICD-10-CM

## 2011-02-12 DIAGNOSIS — J449 Chronic obstructive pulmonary disease, unspecified: Secondary | ICD-10-CM

## 2011-02-12 DIAGNOSIS — F17200 Nicotine dependence, unspecified, uncomplicated: Secondary | ICD-10-CM

## 2011-02-12 DIAGNOSIS — Z0000 Encounter for general adult medical examination without abnormal findings: Secondary | ICD-10-CM

## 2011-02-12 DIAGNOSIS — K219 Gastro-esophageal reflux disease without esophagitis: Secondary | ICD-10-CM

## 2011-02-12 NOTE — Patient Instructions (Addendum)
Check with your insurance to see if they will cover the shingles shot. I would get a flu shot each fall.   Check the American Diabetes Association website.  Work on [employer] and avoid sugars.  We'll recheck your labs in 3 months.  Let me know if you want to go to the classes in the meantime.  30 min OV in 3 months.

## 2011-02-12 NOTE — Progress Notes (Signed)
CPE- See plan.  Routine anticipatory guidance given to patient.  See health maintenance.  Diabetes: new dx.  D/w pt re:dx and labs.  See plan.    Hypertension:    Using medication without problems or lightheadedness: yes Chest pain with exertion:no Edema:no Short of breath:no  COPD/smoking. Off cig and inhalers now.  Feeling better.  Breathing much improved.  Trying to quit chewing tobacco.    PMH and SH reviewed  Meds, vitals, and allergies reviewed.   ROS: See HPI.  Otherwise negative.    GEN: nad, alert and oriented HEENT: mucous membranes moist NECK: supple w/o LA CV: rrr. PULM: ctab but with slight dec in BS globally, no inc wob ABD: soft, +bs EXT: no edema SKIN: no acute rash

## 2011-02-14 ENCOUNTER — Encounter: Payer: Self-pay | Admitting: Family Medicine

## 2011-02-14 DIAGNOSIS — Z0000 Encounter for general adult medical examination without abnormal findings: Secondary | ICD-10-CM | POA: Insufficient documentation

## 2011-02-14 DIAGNOSIS — E119 Type 2 diabetes mellitus without complications: Secondary | ICD-10-CM | POA: Insufficient documentation

## 2011-02-14 NOTE — Assessment & Plan Note (Signed)
Cont ppi ?

## 2011-02-14 NOTE — Assessment & Plan Note (Signed)
D/w pt about cessation.  

## 2011-02-14 NOTE — Assessment & Plan Note (Signed)
Cont current meds. meds d/w pt.  Talked about diet, weight.

## 2011-02-14 NOTE — Assessment & Plan Note (Signed)
PSA options were discussed along with recent recs.  No indication for psa at this point, since patient is low risk and there is no FH of prosate CA.  He declined testing of PSA.  Colon up to date along with flu shot at work.  Healthy habits encouraged.

## 2011-02-14 NOTE — Assessment & Plan Note (Signed)
Now off steroid inhaler, continue saba prn and restart ICS if needed. D/wpt about tobacco.

## 2011-02-14 NOTE — Assessment & Plan Note (Signed)
He declined DM2 classes. Path/phys d/w pt.  He'll look at ADA website.  Talked about diet, weight.  No other new meds now. He understands need for f/u.

## 2011-02-17 ENCOUNTER — Telehealth: Payer: Self-pay | Admitting: *Deleted

## 2011-02-17 NOTE — Telephone Encounter (Signed)
Noted  

## 2011-02-17 NOTE — Telephone Encounter (Signed)
Pt's wife called asking if you would send an order to the pharmacy so that the patient can get a flu shot and zostavax.  I advised her that pt doesn't need order for flu shot, he can just go to his pharmacy.  He will need an order for zostavax but I told her that he should wait a month between the injections.  He will get the zostavax with you when he comes back to see you in January.

## 2011-02-22 ENCOUNTER — Telehealth: Payer: Self-pay | Admitting: *Deleted

## 2011-02-22 NOTE — Telephone Encounter (Signed)
Pt's wife has gotten information on diabetic diets from ADA but she is asking which total calorie diet plan pt should follow- 1200, 1500, 1800 or 2400.  He's 6' and weighs 230 lbs.

## 2011-02-22 NOTE — Telephone Encounter (Signed)
I'd start with 1800.  Thanks.

## 2011-02-22 NOTE — Telephone Encounter (Signed)
Wife advised. 

## 2011-03-19 ENCOUNTER — Other Ambulatory Visit: Payer: Self-pay | Admitting: *Deleted

## 2011-03-19 MED ORDER — LISINOPRIL 20 MG PO TABS: 20.0000 mg | ORAL_TABLET | Freq: Every day | ORAL | Status: DC

## 2011-05-17 ENCOUNTER — Other Ambulatory Visit (INDEPENDENT_AMBULATORY_CARE_PROVIDER_SITE_OTHER): Payer: BC Managed Care – PPO

## 2011-05-17 DIAGNOSIS — E119 Type 2 diabetes mellitus without complications: Secondary | ICD-10-CM

## 2011-05-21 ENCOUNTER — Ambulatory Visit (INDEPENDENT_AMBULATORY_CARE_PROVIDER_SITE_OTHER): Payer: BC Managed Care – PPO | Admitting: Family Medicine

## 2011-05-21 ENCOUNTER — Encounter: Payer: Self-pay | Admitting: Family Medicine

## 2011-05-21 VITALS — BP 108/58 | HR 78 | Temp 98.2°F | Wt 214.8 lb

## 2011-05-21 DIAGNOSIS — Z23 Encounter for immunization: Secondary | ICD-10-CM

## 2011-05-21 DIAGNOSIS — E119 Type 2 diabetes mellitus without complications: Secondary | ICD-10-CM

## 2011-05-21 DIAGNOSIS — I10 Essential (primary) hypertension: Secondary | ICD-10-CM

## 2011-05-21 DIAGNOSIS — Z2911 Encounter for prophylactic immunotherapy for respiratory syncytial virus (RSV): Secondary | ICD-10-CM

## 2011-05-21 MED ORDER — LISINOPRIL 20 MG PO TABS: 10.0000 mg | ORAL_TABLET | Freq: Every day | ORAL | Status: DC

## 2011-05-21 NOTE — Patient Instructions (Addendum)
Keep working on [employer].  Thanks for your effort.  Recheck labs before a physical in September.  Glad to see you.

## 2011-05-21 NOTE — Progress Notes (Signed)
"  My wife has me on a diet."  Down from 72 to 56.    Unsteady on standing in AM, but this self resolves after brief sx.  Noted after weight loss.    Diabetes:  Using medications without difficulties:no meds Hypoglycemic episodes: not checked but no sx Hyperglycemic episodes: not checked but no sx Feet problems: no Blood Sugars averaging:not checked eye exam within last year: done last week  PMH and SH reviewed  Meds, vitals, and allergies reviewed.   ROS: See HPI.  Otherwise negative.    GEN: nad, alert and oriented HEENT: mucous membranes moist NECK: supple w/o LA CV: rrr. PULM: ctab, no inc wob ABD: soft, +bs EXT: no edema SKIN: no acute rash  Diabetic foot exam: Normal inspection No skin breakdown No calluses  Normal DP pulses Normal sensation to light touch and monofilament Nails normal

## 2011-05-23 ENCOUNTER — Encounter: Payer: Self-pay | Admitting: Family Medicine

## 2011-05-23 NOTE — Assessment & Plan Note (Signed)
Diet controlled, no meds.  A1c improved with weight loss.  Continue as is.  I thanked him for his effort.  >25 min spent with face to face with patient, >50% counseling and/or coordinating care.

## 2011-05-23 NOTE — Assessment & Plan Note (Signed)
Dec ACE due to likely orthostasis and call back as needed. He agrees.

## 2011-06-21 ENCOUNTER — Other Ambulatory Visit: Payer: Self-pay

## 2011-06-21 MED ORDER — PANTOPRAZOLE SODIUM 40 MG PO TBEC: 40.0000 mg | DELAYED_RELEASE_TABLET | Freq: Every day | ORAL | Status: DC

## 2011-06-21 NOTE — Telephone Encounter (Signed)
[facility] faxed refill request Pantoprazole 40 mg #30 x 11.

## 2011-08-18 ENCOUNTER — Other Ambulatory Visit: Payer: Self-pay | Admitting: *Deleted

## 2011-08-18 MED ORDER — LISINOPRIL 20 MG PO TABS: 10.0000 mg | ORAL_TABLET | Freq: Every day | ORAL | Status: DC

## 2012-02-06 ENCOUNTER — Telehealth: Payer: Self-pay | Admitting: Family Medicine

## 2012-02-06 DIAGNOSIS — Z125 Encounter for screening for malignant neoplasm of prostate: Secondary | ICD-10-CM

## 2012-02-06 DIAGNOSIS — E119 Type 2 diabetes mellitus without complications: Secondary | ICD-10-CM

## 2012-02-06 NOTE — Telephone Encounter (Signed)
Dr Duncan's pt  

## 2012-02-06 NOTE — Telephone Encounter (Signed)
Message copied by Judy Pimple on Sun Feb 06, 2012  9:18 PM ------      Message from: Alvina Chou      Created: Wed Feb 02, 2012 11:21 AM      Regarding: lab orders for Monday 02-07-12       Patient is scheduled for CPX labs, please order future labs, Thanks , Camelia Eng

## 2012-02-07 ENCOUNTER — Other Ambulatory Visit (INDEPENDENT_AMBULATORY_CARE_PROVIDER_SITE_OTHER): Payer: BC Managed Care – PPO

## 2012-02-07 DIAGNOSIS — Z125 Encounter for screening for malignant neoplasm of prostate: Secondary | ICD-10-CM

## 2012-02-07 DIAGNOSIS — E119 Type 2 diabetes mellitus without complications: Secondary | ICD-10-CM

## 2012-02-07 LAB — COMPREHENSIVE METABOLIC PANEL
ALT: 24 U/L (ref 0–53)
AST: 21 U/L (ref 0–37)
CO2: 28 mEq/L (ref 19–32)
Calcium: 8.9 mg/dL (ref 8.4–10.5)
Chloride: 106 mEq/L (ref 96–112)
Creatinine, Ser: 1.3 mg/dL (ref 0.4–1.5)
GFR: 58.78 mL/min — ABNORMAL LOW (ref >60.00)
Sodium: 139 mEq/L (ref 135–145)
Total Protein: 6.6 g/dL (ref 6.0–8.3)

## 2012-02-07 LAB — LIPID PANEL
LDL Cholesterol: 81 mg/dL (ref 0–99)
Total CHOL/HDL Ratio: 3
Triglycerides: 57 mg/dL (ref 0.0–149.0)

## 2012-02-07 LAB — HEMOGLOBIN A1C: Hgb A1c MFr Bld: 5.9 % (ref 4.6–6.5)

## 2012-02-07 NOTE — Telephone Encounter (Signed)
Ordered

## 2012-02-14 ENCOUNTER — Ambulatory Visit (INDEPENDENT_AMBULATORY_CARE_PROVIDER_SITE_OTHER): Payer: BC Managed Care – PPO | Admitting: Family Medicine

## 2012-02-14 ENCOUNTER — Encounter: Payer: Self-pay | Admitting: Family Medicine

## 2012-02-14 VITALS — BP 102/54 | HR 65 | Temp 97.9°F | Ht 71.0 in | Wt 205.0 lb

## 2012-02-14 DIAGNOSIS — Z0000 Encounter for general adult medical examination without abnormal findings: Secondary | ICD-10-CM

## 2012-02-14 DIAGNOSIS — K2270 Barrett's esophagus without dysplasia: Secondary | ICD-10-CM

## 2012-02-14 DIAGNOSIS — Z8639 Personal history of other endocrine, nutritional and metabolic disease: Secondary | ICD-10-CM

## 2012-02-14 DIAGNOSIS — F17200 Nicotine dependence, unspecified, uncomplicated: Secondary | ICD-10-CM

## 2012-02-14 DIAGNOSIS — Z23 Encounter for immunization: Secondary | ICD-10-CM

## 2012-02-14 DIAGNOSIS — Z862 Personal history of diseases of the blood and blood-forming organs and certain disorders involving the immune mechanism: Secondary | ICD-10-CM

## 2012-02-14 NOTE — Assessment & Plan Note (Signed)
He'll work on cutting out tobacco.  He's already made a lot of other lifestyle changes.  If he gets more irritable, he'll notify me.  Okay for outpatient f/u.

## 2012-02-14 NOTE — Progress Notes (Signed)
CPE- See plan.  Routine anticipatory guidance given to patient.  See health maintenance. Tetanus 2008 Shingles shot done 2013 Flu shot 2013.   Colonoscopy 2012 PSA wnl.  No dysuria or stream changes. Advance directive d/w pt. He'll consider.  Walking for exercise.   Dipping tobacco.  He had started back; discussed.  Mood is good.  No SI/HI.  He was less patient when he was off tobacco.  "I've never been patient" and his wife thinks this is slightly worse recently.  He'll notify me if worse.    H/o Barretts. EGD up to date and on PPI.  No heartburn.  He can tell a difference with the weight loss and diet.    H/o diet controlled DM.  Continued weight loss with diet changes.  Sugar 87-106.  He doesn't have DM now based on his sugars.    HTN.  occ lightheaded on standing.  Continued weight loss with diet changes.  Compliant with meds.  BP not elevated on home checks.   PMH and SH reviewed  Meds, vitals, and allergies reviewed.   ROS: See HPI.  Otherwise negative.    GEN: nad, alert and oriented HEENT: mucous membranes moist NECK: supple w/o LA CV: rrr. PULM: ctab, no inc wob ABD: soft, +bs EXT: no edema SKIN: no acute rash Affect, judgement, speech wnl

## 2012-02-14 NOTE — Patient Instructions (Addendum)
Based on your labs, you don't have diabetes.  Keep working on your diet to keep it from coming back (or at least delay it from coming back). Check your sugar a few times a month.  If AM sugar is usually <100, then it is fine.  If it is consistently 120-125 or higher, then notify me.   Stop the lisinopril.  If your pressure is consistently >140/90, then notify me.   Recheck labs in 1 year before a physical.   Thank you for your effort.   I would get a flu shot each fall.   If you need to get in touch with me, call the triage line here at the clinic.

## 2012-02-14 NOTE — Assessment & Plan Note (Signed)
Routine anticipatory guidance given to patient. See health maintenance.  Tetanus 2008  Shingles shot done 2013  Flu shot 2013.  Colonoscopy 2012  PSA wnl. No dysuria or stream changes.  Advance directive d/w pt. He'll consider.  Walking for exercise.

## 2012-02-14 NOTE — Assessment & Plan Note (Signed)
Continue PPI ?

## 2012-02-14 NOTE — Assessment & Plan Note (Signed)
Now resolved, stop BP meds and f/u prn for this unless elevated on home checks.  Will check BP as physicals o/w.

## 2012-02-14 NOTE — Assessment & Plan Note (Signed)
Resolved with weight loss 2013.  He'll check sugar at home and f/u if elevated.  Continue diet and exercise.  I thanked him for his effort.

## 2012-06-14 ENCOUNTER — Other Ambulatory Visit: Payer: Self-pay | Admitting: *Deleted

## 2012-06-14 MED ORDER — PANTOPRAZOLE SODIUM 40 MG PO TBEC: 40.0000 mg | DELAYED_RELEASE_TABLET | Freq: Every day | ORAL | Status: DC

## 2012-11-01 DIAGNOSIS — C44310 Basal cell carcinoma of skin of unspecified parts of face: Secondary | ICD-10-CM | POA: Insufficient documentation

## 2012-11-24 ENCOUNTER — Encounter: Payer: Self-pay | Admitting: Family Medicine

## 2013-01-24 ENCOUNTER — Other Ambulatory Visit: Payer: Self-pay | Admitting: *Deleted

## 2013-01-24 MED ORDER — PANTOPRAZOLE SODIUM 40 MG PO TBEC: 40.0000 mg | DELAYED_RELEASE_TABLET | Freq: Every day | ORAL | Status: DC

## 2013-01-24 NOTE — Telephone Encounter (Signed)
Received faxed refill request from pharmacy. Refill sent to pharmacy electronically. 

## 2013-01-30 ENCOUNTER — Other Ambulatory Visit: Payer: Self-pay | Admitting: Family Medicine

## 2013-01-30 DIAGNOSIS — R739 Hyperglycemia, unspecified: Secondary | ICD-10-CM

## 2013-02-08 ENCOUNTER — Other Ambulatory Visit (INDEPENDENT_AMBULATORY_CARE_PROVIDER_SITE_OTHER): Payer: BC Managed Care – PPO

## 2013-02-08 DIAGNOSIS — R739 Hyperglycemia, unspecified: Secondary | ICD-10-CM

## 2013-02-08 DIAGNOSIS — Z0000 Encounter for general adult medical examination without abnormal findings: Secondary | ICD-10-CM

## 2013-02-08 LAB — LIPID PANEL
Cholesterol: 133 mg/dL (ref 0–200)
HDL: 38.7 mg/dL — ABNORMAL LOW (ref >39.00)
Total CHOL/HDL Ratio: 3
Triglycerides: 98 mg/dL (ref 0.0–149.0)

## 2013-02-08 LAB — COMPREHENSIVE METABOLIC PANEL
AST: 18 U/L (ref 0–37)
BUN: 24 mg/dL — ABNORMAL HIGH (ref 6–23)
Calcium: 9.1 mg/dL (ref 8.4–10.5)
Chloride: 105 mEq/L (ref 96–112)
Creatinine, Ser: 1.3 mg/dL (ref 0.4–1.5)

## 2013-02-15 ENCOUNTER — Encounter: Payer: BC Managed Care – PPO | Admitting: Family Medicine

## 2013-02-23 ENCOUNTER — Other Ambulatory Visit: Payer: Self-pay | Admitting: *Deleted

## 2013-02-23 MED ORDER — PANTOPRAZOLE SODIUM 40 MG PO TBEC: 40.0000 mg | DELAYED_RELEASE_TABLET | Freq: Every day | ORAL | Status: DC

## 2013-02-27 ENCOUNTER — Encounter: Payer: Self-pay | Admitting: Family Medicine

## 2013-02-27 ENCOUNTER — Ambulatory Visit (INDEPENDENT_AMBULATORY_CARE_PROVIDER_SITE_OTHER): Payer: BC Managed Care – PPO | Admitting: Family Medicine

## 2013-02-27 VITALS — BP 130/70 | HR 60 | Temp 97.8°F | Ht 71.0 in | Wt 207.8 lb

## 2013-02-27 DIAGNOSIS — Z23 Encounter for immunization: Secondary | ICD-10-CM

## 2013-02-27 DIAGNOSIS — R519 Headache, unspecified: Secondary | ICD-10-CM | POA: Insufficient documentation

## 2013-02-27 DIAGNOSIS — Z0000 Encounter for general adult medical examination without abnormal findings: Secondary | ICD-10-CM

## 2013-02-27 MED ORDER — PANTOPRAZOLE SODIUM 40 MG PO TBEC: 40.0000 mg | DELAYED_RELEASE_TABLET | Freq: Every day | ORAL | Status: DC

## 2013-02-27 NOTE — Assessment & Plan Note (Signed)
Routine anticipatory guidance given to patient.  See health maintenance. Tetanus 2008 Shingles 2013 Flu today.   Colonoscopy 2012 Prostate cancer screening and PSA options (with potential risks and benefits of testing vs not testing) were discussed along with recent recs/guidelines.  He declined testing PSA at this point. Living will d/w pt.  Encouraged.  Wife would be designated if he were incapacitated.   Diet and exercise d/w pt.   Working on both.  A1c at goal for h/o DM2.  Continue PPI for barretts.

## 2013-02-27 NOTE — Progress Notes (Signed)
CPE- See plan.  Routine anticipatory guidance given to patient.  See health maintenance. Tetanus 2008 Shingles 2013 Flu today.   Colonoscopy 2012 Prostate cancer screening and PSA options (with potential risks and benefits of testing vs not testing) were discussed along with recent recs/guidelines.  He declined testing PSA at this point. Living will d/w pt.  Encouraged.  Wife would be designated if he were incapacitated.   Diet and exercise d/w pt.   Working on both.    H/o DM2. A1c at goal.  Sugars 90s in AM at home.  Labs d/w pt.   EGD for Barrett's 2012, per GI.  On PPI.   He has sensitivity to the L side of the nose, in the nasal crease.  Intermittent, better with external compression.  No rash.  Going on since his gallbladder surgery.  Asking about options.  Episodes are brief, lasting seconds.  Not bothersome enough to need proph meds.  PMH and SH reviewed  Meds, vitals, and allergies reviewed.   ROS: See HPI.  Otherwise negative.    GEN: nad, alert and oriented HEENT: mucous membranes moist NECK: supple w/o LA CV: rrr. PULM: ctab, no inc wob ABD: soft, +bs EXT: no edema SKIN: no acute rash CN 2-12 wnl B, S/S wnl x4

## 2013-02-27 NOTE — Assessment & Plan Note (Signed)
Should the sensation changes become more problematic, we can address.  He has no motor loss and this is likely an intermittent irritation of a peripheral nerve causing the pain.  Not likely to be trigeminal neuralgia or other sig dx.

## 2013-02-27 NOTE — Patient Instructions (Signed)
Take care. Don't change your meds.  Glad to see you.  

## 2013-11-22 ENCOUNTER — Ambulatory Visit (INDEPENDENT_AMBULATORY_CARE_PROVIDER_SITE_OTHER): Payer: BC Managed Care – PPO | Admitting: Family Medicine

## 2013-11-22 ENCOUNTER — Encounter: Payer: Self-pay | Admitting: Family Medicine

## 2013-11-22 VITALS — BP 126/64 | HR 84 | Temp 97.8°F | Wt 218.8 lb

## 2013-11-22 DIAGNOSIS — R220 Localized swelling, mass and lump, head: Secondary | ICD-10-CM

## 2013-11-22 DIAGNOSIS — R221 Localized swelling, mass and lump, neck: Secondary | ICD-10-CM | POA: Insufficient documentation

## 2013-11-22 NOTE — Assessment & Plan Note (Signed)
Possible LA, unclear duration.  Check CBC today.  If not resolved or if CBC with sig abnormality, we can proceed with likely u/s.  D/w pt.  He agrees.  App help of GI clinic.

## 2013-11-22 NOTE — Progress Notes (Signed)
Pre visit review using our clinic review tool, if applicable. No additional management support is needed unless otherwise documented below in the visit note.  Facial BCC removed by Dr. Manley Mason at [facility].  Bandaged now.   Has EGD f/u for barrett's scheduled.  GI clinic noted possible LA in neck this week and wanted patient checked.  No FCNAVD.  No cold, URI sx.  Feels well o/w.  No dysphagia.  He didn't notice the masses before the GI clinic did.  Former smoker.   EGD planned for 12/13/13 at Dr. [facility].   Meds, vitals, and allergies reviewed.   ROS: See HPI.  Otherwise, noncontributory.  nad ncat Tm wnl Nasal and OP exam wnl, no oral lesions Neck supple.  R>L mass, possible LA noted in the neck superior to the thyroid. Not ttp No bruit noted.  No other LA, postauricular or clavicular.   rrr ctab

## 2013-11-22 NOTE — Patient Instructions (Signed)
Go to the lab on the way out.  We'll contact you with your lab report. Update [place] Monday or Tuesday about the spots on your neck.  We may need to get an ultrasound done.

## 2013-11-23 LAB — CBC WITH DIFFERENTIAL/PLATELET
Basophils Absolute: 0.1 10*3/uL (ref 0.0–0.1)
Basophils Relative: 0.8 % (ref 0.0–3.0)
Eosinophils Absolute: 0.3 10*3/uL (ref 0.0–0.7)
Eosinophils Relative: 3.2 % (ref 0.0–5.0)
HEMATOCRIT: 42 % (ref 39.0–52.0)
HEMOGLOBIN: 14 g/dL (ref 13.0–17.0)
LYMPHS PCT: 30.4 % (ref 12.0–46.0)
Lymphs Abs: 2.9 10*3/uL (ref 0.7–4.0)
MCHC: 33.3 g/dL (ref 30.0–36.0)
MCV: 92.2 fl (ref 78.0–100.0)
MONOS PCT: 6.6 % (ref 3.0–12.0)
Monocytes Absolute: 0.6 10*3/uL (ref 0.1–1.0)
NEUTROS ABS: 5.7 10*3/uL (ref 1.4–7.7)
Neutrophils Relative %: 59 % (ref 43.0–77.0)
Platelets: 196 10*3/uL (ref 150.0–400.0)
RBC: 4.56 Mil/uL (ref 4.22–5.81)
RDW: 12.3 % (ref 11.5–15.5)
WBC: 9.6 10*3/uL (ref 4.0–10.5)

## 2013-11-23 LAB — TSH: TSH: 0.4 u[IU]/mL (ref 0.35–4.50)

## 2013-12-12 ENCOUNTER — Telehealth: Payer: Self-pay | Admitting: Family Medicine

## 2013-12-12 NOTE — Telephone Encounter (Signed)
Please call pt.  Verify his timeline for prev smoking.  If he smoked at least 30 pack-years, then he would qualify for lung cancer screening with a CT scan. We can go ahead and refer him if he desires, or we can talk about it when he comes in at the next CPE this fall.  He had asked about this in passing when I saw him prev, and I told him I'd check on it.   Let me know if I need to put in the referral.  Thanks.

## 2013-12-13 ENCOUNTER — Ambulatory Visit: Payer: Self-pay | Admitting: Unknown Physician Specialty

## 2013-12-13 NOTE — Telephone Encounter (Signed)
Patient advised.   Patient says he will speak with Dr. Damita Dunnings about this at his upcoming CPE in September.

## 2013-12-14 LAB — PATHOLOGY REPORT

## 2013-12-19 ENCOUNTER — Encounter: Payer: Self-pay | Admitting: Family Medicine

## 2013-12-26 ENCOUNTER — Encounter: Payer: Self-pay | Admitting: Family Medicine

## 2014-01-02 ENCOUNTER — Encounter: Payer: Self-pay | Admitting: Family Medicine

## 2014-02-17 ENCOUNTER — Other Ambulatory Visit: Payer: Self-pay | Admitting: Family Medicine

## 2014-02-17 DIAGNOSIS — Z8639 Personal history of other endocrine, nutritional and metabolic disease: Secondary | ICD-10-CM

## 2014-02-21 ENCOUNTER — Other Ambulatory Visit (INDEPENDENT_AMBULATORY_CARE_PROVIDER_SITE_OTHER): Payer: BC Managed Care – PPO

## 2014-02-21 DIAGNOSIS — Z8639 Personal history of other endocrine, nutritional and metabolic disease: Secondary | ICD-10-CM

## 2014-02-21 LAB — LIPID PANEL
Cholesterol: 134 mg/dL (ref 0–200)
HDL: 34 mg/dL — ABNORMAL LOW (ref >39.00)
LDL Cholesterol: 86 mg/dL (ref 0–99)
NONHDL: 100
Total CHOL/HDL Ratio: 4
Triglycerides: 69 mg/dL (ref 0.0–149.0)
VLDL: 13.8 mg/dL (ref 0.0–40.0)

## 2014-02-21 LAB — HEMOGLOBIN A1C: Hgb A1c MFr Bld: 6.3 % (ref 4.6–6.5)

## 2014-02-22 ENCOUNTER — Other Ambulatory Visit (INDEPENDENT_AMBULATORY_CARE_PROVIDER_SITE_OTHER): Payer: BC Managed Care – PPO

## 2014-02-22 DIAGNOSIS — Z8639 Personal history of other endocrine, nutritional and metabolic disease: Secondary | ICD-10-CM

## 2014-02-22 LAB — COMPREHENSIVE METABOLIC PANEL
ALT: 32 U/L (ref 0–53)
AST: 24 U/L (ref 0–37)
Albumin: 3.6 g/dL (ref 3.5–5.2)
Alkaline Phosphatase: 31 U/L — ABNORMAL LOW (ref 39–117)
BUN: 19 mg/dL (ref 6–23)
CALCIUM: 9.1 mg/dL (ref 8.4–10.5)
CHLORIDE: 105 meq/L (ref 96–112)
CO2: 26 mEq/L (ref 19–32)
CREATININE: 1.3 mg/dL (ref 0.4–1.5)
GFR: 58.4 mL/min — ABNORMAL LOW (ref >60.00)
Glucose, Bld: 122 mg/dL — ABNORMAL HIGH (ref 70–99)
POTASSIUM: 4.7 meq/L (ref 3.5–5.1)
Sodium: 139 mEq/L (ref 135–145)
Total Bilirubin: 0.5 mg/dL (ref 0.2–1.2)
Total Protein: 7.2 g/dL (ref 6.0–8.3)

## 2014-02-28 ENCOUNTER — Ambulatory Visit (INDEPENDENT_AMBULATORY_CARE_PROVIDER_SITE_OTHER): Payer: BC Managed Care – PPO | Admitting: Family Medicine

## 2014-02-28 ENCOUNTER — Encounter: Payer: Self-pay | Admitting: Family Medicine

## 2014-02-28 VITALS — BP 118/64 | HR 65 | Temp 97.8°F | Ht 71.33 in | Wt 217.0 lb

## 2014-02-28 DIAGNOSIS — Z8639 Personal history of other endocrine, nutritional and metabolic disease: Secondary | ICD-10-CM

## 2014-02-28 DIAGNOSIS — K2270 Barrett's esophagus without dysplasia: Secondary | ICD-10-CM

## 2014-02-28 DIAGNOSIS — Z0000 Encounter for general adult medical examination without abnormal findings: Secondary | ICD-10-CM

## 2014-02-28 DIAGNOSIS — Z7189 Other specified counseling: Secondary | ICD-10-CM

## 2014-02-28 MED ORDER — PANTOPRAZOLE SODIUM 40 MG PO TBEC: 40.0000 mg | DELAYED_RELEASE_TABLET | Freq: Every day | ORAL | Status: DC

## 2014-02-28 NOTE — Progress Notes (Signed)
Pre visit review using our clinic review tool, if applicable. No additional management support is needed unless otherwise documented below in the visit note.  CPE- See plan.  Routine anticipatory guidance given to patient.  See health maintenance. Tetanus 2008 Shingles 2013 Flu done at pharmacy, done 2 weeks ago PNA due at 38.  D/w pt.   EGD per GI 2015 Colonoscopy 2012 Living will d/w pt.  Wife designated if patient were incapacitated.   Diet and exercise d/w pt.  "Not as good as I was."  He is going to work on both.  Discussed.   Prostate cancer screening and PSA options (with potential risks and benefits of testing vs not testing) were discussed along with recent recs/guidelines.  He declined testing PSA at this point.  There was a question of a neck mass earlier in the year.  In meantime, no new sx, no complaints, no dysphagia. Prev with normal CBC.    H/o DM2.  Sugar elevated, but not at DM2 level.  D/w pt.   PMH and SH reviewed  Meds, vitals, and allergies reviewed.   ROS: See HPI.  Otherwise negative.    GEN: nad, alert and oriented HEENT: mucous membranes moist, OP wnl NECK: supple w/o LA, no asymmetry, no mass noted.  No tenderness.   CV: rrr. PULM: ctab, no inc wob ABD: soft, +bs EXT: no edema SKIN: no acute rash, SKs noted on back

## 2014-02-28 NOTE — Patient Instructions (Signed)
We'll get you a pneumonia shot after you turn 54.  Take care.  Glad to see you.

## 2014-03-01 DIAGNOSIS — Z7189 Other specified counseling: Secondary | ICD-10-CM | POA: Insufficient documentation

## 2014-03-01 NOTE — Assessment & Plan Note (Signed)
Not a point on DM2, hyperglycemia noted.  D/w pt.  He'll work on diet and exercise.

## 2014-03-01 NOTE — Assessment & Plan Note (Signed)
Routine anticipatory guidance given to patient.  See health maintenance. Tetanus 2008 Shingles 2013 Flu done at pharmacy, done 2 weeks ago PNA due at 36.  D/w pt.   EGD per GI 2015 Colonoscopy 2012 Living will d/w pt.  Wife designated if patient were incapacitated.   Diet and exercise d/w pt.  "Not as good as I was."  He is going to work on both.  Discussed.   Prostate cancer screening and PSA options (with potential risks and benefits of testing vs not testing) were discussed along with recent recs/guidelines.  He declined testing PSA at this point. I don't see or feel a neck asymmetry/mass. D/w pt. Wouldn't intervene at this point.  He agrees.

## 2014-03-01 NOTE — Assessment & Plan Note (Signed)
Continue PPI per GI rec.  He agrees.

## 2014-05-15 DIAGNOSIS — C44219 Basal cell carcinoma of skin of left ear and external auricular canal: Secondary | ICD-10-CM | POA: Diagnosis not present

## 2014-07-15 DIAGNOSIS — L820 Inflamed seborrheic keratosis: Secondary | ICD-10-CM | POA: Diagnosis not present

## 2014-07-15 DIAGNOSIS — C44219 Basal cell carcinoma of skin of left ear and external auricular canal: Secondary | ICD-10-CM | POA: Diagnosis not present

## 2014-07-15 DIAGNOSIS — D485 Neoplasm of uncertain behavior of skin: Secondary | ICD-10-CM | POA: Diagnosis not present

## 2014-07-15 DIAGNOSIS — L821 Other seborrheic keratosis: Secondary | ICD-10-CM | POA: Diagnosis not present

## 2014-07-15 DIAGNOSIS — L570 Actinic keratosis: Secondary | ICD-10-CM | POA: Diagnosis not present

## 2014-07-15 DIAGNOSIS — Z85828 Personal history of other malignant neoplasm of skin: Secondary | ICD-10-CM | POA: Diagnosis not present

## 2014-07-15 DIAGNOSIS — B079 Viral wart, unspecified: Secondary | ICD-10-CM | POA: Diagnosis not present

## 2014-07-15 DIAGNOSIS — X32XXXA Exposure to sunlight, initial encounter: Secondary | ICD-10-CM | POA: Diagnosis not present

## 2014-09-06 DIAGNOSIS — C44219 Basal cell carcinoma of skin of left ear and external auricular canal: Secondary | ICD-10-CM | POA: Diagnosis not present

## 2014-10-31 ENCOUNTER — Ambulatory Visit (INDEPENDENT_AMBULATORY_CARE_PROVIDER_SITE_OTHER): Payer: Medicare Other | Admitting: Family Medicine

## 2014-10-31 ENCOUNTER — Encounter: Payer: Self-pay | Admitting: Family Medicine

## 2014-10-31 VITALS — BP 120/62 | HR 67 | Temp 98.9°F | Wt 227.2 lb

## 2014-10-31 DIAGNOSIS — M7989 Other specified soft tissue disorders: Secondary | ICD-10-CM

## 2014-10-31 DIAGNOSIS — J358 Other chronic diseases of tonsils and adenoids: Secondary | ICD-10-CM | POA: Diagnosis not present

## 2014-10-31 DIAGNOSIS — R609 Edema, unspecified: Secondary | ICD-10-CM | POA: Diagnosis not present

## 2014-10-31 NOTE — Patient Instructions (Signed)
Go to the lab on the way out.  We'll contact you with your lab report. Elevate your feet, limit salt, and update me is not improved.   Change in breath/scent likely from tonsil stones.  Gargle with salt water as needed.  Take care.  Glad to see you.

## 2014-10-31 NOTE — Progress Notes (Signed)
Pre visit review using our clinic review tool, if applicable. No additional management support is needed unless otherwise documented below in the visit note.  He had been working around the house and "my back went out" a few weeks ago.  He was using aspercreme patch and advil/aleve over the last few weeks.  No nsaids in the last 2 days.  Swelling in B legs for a few weeks, some before the injury, worse in the last 2 weeks.  Feet are some better today but still swollen.  Swelling worse as the day goes on.    Back is some better now but still has a tender area in the R lower back.    No FCNAVD.  No CP, SOB.  Sleeping on 1 pillow.  No extra salt use.    Wife note halitosis.  He hasn't noted it.  No taste changes. No ST.  Slightly hoarse voice.  May coincide with nsaid use.  He does have h/o tonsil stones.   Meds, vitals, and allergies reviewed.   ROS: See HPI.  Otherwise, noncontributory.  GEN: nad, alert and oriented HEENT: mucous membranes moist, OP WNL NECK: supple w/o LA CV: rrr. PULM: ctab, no inc wob ABD: soft, +bs EXT: 2+ pitting BLE edema SKIN: no acute rash

## 2014-11-01 DIAGNOSIS — J358 Other chronic diseases of tonsils and adenoids: Secondary | ICD-10-CM | POA: Insufficient documentation

## 2014-11-01 DIAGNOSIS — R609 Edema, unspecified: Secondary | ICD-10-CM | POA: Insufficient documentation

## 2014-11-01 LAB — BASIC METABOLIC PANEL
BUN: 25 mg/dL — AB (ref 6–23)
CALCIUM: 9.3 mg/dL (ref 8.4–10.5)
CO2: 29 meq/L (ref 19–32)
Chloride: 105 mEq/L (ref 96–112)
Creatinine, Ser: 1.27 mg/dL (ref 0.40–1.50)
GFR: 60.4 mL/min (ref >60.00)
GLUCOSE: 92 mg/dL (ref 70–99)
Potassium: 4.2 mEq/L (ref 3.5–5.1)
Sodium: 140 mEq/L (ref 135–145)

## 2014-11-01 NOTE — Assessment & Plan Note (Signed)
None seen currently but presumed cause of halitosis.  Gargle with salt water and update me as needed.

## 2014-11-01 NOTE — Assessment & Plan Note (Addendum)
Ctab, no CP, not sob.  BLE edema noted.  Likely from nsaids, avoid from now on.  Elevate legs, check bmet and he'll update me as needed.  EKG w/o sig change from prev. >25 minutes spent in face to face time with patient, >50% spent in counselling or coordination of care.

## 2014-11-04 ENCOUNTER — Encounter: Payer: Self-pay | Admitting: Family Medicine

## 2014-11-19 ENCOUNTER — Encounter: Payer: Self-pay | Admitting: Family Medicine

## 2014-12-12 DIAGNOSIS — X32XXXA Exposure to sunlight, initial encounter: Secondary | ICD-10-CM | POA: Diagnosis not present

## 2014-12-12 DIAGNOSIS — Z85828 Personal history of other malignant neoplasm of skin: Secondary | ICD-10-CM | POA: Diagnosis not present

## 2014-12-12 DIAGNOSIS — C44719 Basal cell carcinoma of skin of left lower limb, including hip: Secondary | ICD-10-CM | POA: Diagnosis not present

## 2014-12-12 DIAGNOSIS — L570 Actinic keratosis: Secondary | ICD-10-CM | POA: Diagnosis not present

## 2014-12-12 DIAGNOSIS — D485 Neoplasm of uncertain behavior of skin: Secondary | ICD-10-CM | POA: Diagnosis not present

## 2014-12-14 ENCOUNTER — Other Ambulatory Visit: Payer: Self-pay | Admitting: Family Medicine

## 2015-01-14 DIAGNOSIS — C44729 Squamous cell carcinoma of skin of left lower limb, including hip: Secondary | ICD-10-CM | POA: Diagnosis not present

## 2015-01-14 DIAGNOSIS — C44719 Basal cell carcinoma of skin of left lower limb, including hip: Secondary | ICD-10-CM | POA: Diagnosis not present

## 2015-02-10 DIAGNOSIS — Z961 Presence of intraocular lens: Secondary | ICD-10-CM | POA: Diagnosis not present

## 2015-02-21 ENCOUNTER — Other Ambulatory Visit: Payer: Self-pay | Admitting: Family Medicine

## 2015-02-21 DIAGNOSIS — E786 Lipoprotein deficiency: Secondary | ICD-10-CM

## 2015-02-21 DIAGNOSIS — R739 Hyperglycemia, unspecified: Secondary | ICD-10-CM

## 2015-02-24 ENCOUNTER — Other Ambulatory Visit (INDEPENDENT_AMBULATORY_CARE_PROVIDER_SITE_OTHER): Payer: Medicare Other

## 2015-02-24 DIAGNOSIS — E786 Lipoprotein deficiency: Secondary | ICD-10-CM | POA: Diagnosis not present

## 2015-02-24 DIAGNOSIS — R739 Hyperglycemia, unspecified: Secondary | ICD-10-CM

## 2015-02-24 LAB — COMPREHENSIVE METABOLIC PANEL
ALT: 35 U/L (ref 0–53)
AST: 23 U/L (ref 0–37)
Albumin: 4 g/dL (ref 3.5–5.2)
Alkaline Phosphatase: 34 U/L — ABNORMAL LOW (ref 39–117)
BUN: 20 mg/dL (ref 6–23)
CO2: 29 mEq/L (ref 19–32)
Calcium: 9 mg/dL (ref 8.4–10.5)
Chloride: 104 mEq/L (ref 96–112)
Creatinine, Ser: 1.19 mg/dL (ref 0.40–1.50)
GFR: 65.05 mL/min (ref >60.00)
Glucose, Bld: 139 mg/dL — ABNORMAL HIGH (ref 70–99)
Potassium: 4.2 mEq/L (ref 3.5–5.1)
Sodium: 140 mEq/L (ref 135–145)
Total Bilirubin: 0.5 mg/dL (ref 0.2–1.2)
Total Protein: 7 g/dL (ref 6.0–8.3)

## 2015-02-24 LAB — LIPID PANEL
Cholesterol: 130 mg/dL (ref 0–200)
HDL: 42.5 mg/dL (ref >39.00)
LDL Cholesterol: 74 mg/dL (ref 0–99)
NonHDL: 87.03
Total CHOL/HDL Ratio: 3
Triglycerides: 66 mg/dL (ref 0.0–149.0)
VLDL: 13.2 mg/dL (ref 0.0–40.0)

## 2015-02-24 LAB — HEMOGLOBIN A1C: Hgb A1c MFr Bld: 6.5 % (ref 4.6–6.5)

## 2015-03-03 ENCOUNTER — Ambulatory Visit (INDEPENDENT_AMBULATORY_CARE_PROVIDER_SITE_OTHER): Payer: Medicare Other | Admitting: Family Medicine

## 2015-03-03 ENCOUNTER — Encounter: Payer: Self-pay | Admitting: Family Medicine

## 2015-03-03 VITALS — BP 126/72 | HR 62 | Temp 98.8°F | Ht 71.0 in | Wt 224.0 lb

## 2015-03-03 DIAGNOSIS — Z119 Encounter for screening for infectious and parasitic diseases, unspecified: Secondary | ICD-10-CM

## 2015-03-03 DIAGNOSIS — Z114 Encounter for screening for human immunodeficiency virus [HIV]: Secondary | ICD-10-CM

## 2015-03-03 DIAGNOSIS — R739 Hyperglycemia, unspecified: Secondary | ICD-10-CM

## 2015-03-03 DIAGNOSIS — Z136 Encounter for screening for cardiovascular disorders: Secondary | ICD-10-CM

## 2015-03-03 DIAGNOSIS — Z8639 Personal history of other endocrine, nutritional and metabolic disease: Secondary | ICD-10-CM

## 2015-03-03 DIAGNOSIS — K2270 Barrett's esophagus without dysplasia: Secondary | ICD-10-CM

## 2015-03-03 DIAGNOSIS — Z0000 Encounter for general adult medical examination without abnormal findings: Secondary | ICD-10-CM | POA: Diagnosis not present

## 2015-03-03 DIAGNOSIS — Z23 Encounter for immunization: Secondary | ICD-10-CM

## 2015-03-03 MED ORDER — PANTOPRAZOLE SODIUM 40 MG PO TBEC: DELAYED_RELEASE_TABLET | ORAL | Status: DC

## 2015-03-03 NOTE — Patient Instructions (Signed)
Recheck labs in about 6 months.  Cut carbs in the meantime.   Take care.  Glad to see you.

## 2015-03-03 NOTE — Progress Notes (Signed)
Pre visit review using our clinic review tool, if applicable. No additional management support is needed unless otherwise documented below in the visit note.  I have personally reviewed the Medicare Annual Wellness questionnaire and have noted 1. The patient's medical and social history 2. Their use of alcohol, tobacco or illicit drugs 3. Their current medications and supplements 4. The patient's functional ability including ADL's, fall risks, home safety risks and hearing or visual             impairment. 5. Diet and physical activities 6. Evidence for depression or mood disorders  The patients weight, height, BMI have been recorded in the chart and visual acuity is per eye clinic.  I have made referrals, counseling and provided education to the patient based review of the above and I have provided the pt with a written personalized care plan for preventive services.  Provider list updated- see scanned forms.  Routine anticipatory guidance given to patient.  See health maintenance.  Flu 2016 Shingles 2013 PNA 2016 Tetanus 2008 Colonoscopy due 2017 Prostate cancer screening and PSA options (with potential risks and benefits of testing vs not testing) were discussed along with recent recs/guidelines.  He declined testing PSA at this point. Advance directive- wife designated if patient were incapacitated.   Cognitive function addressed- see scanned forms- and if abnormal then additional documentation follows.  Pt opts in for HCV and HIV screening.  D/w pt re: routine screening with next set of labs.   EKG recently done, d/w pt. No need for repeat today.   H/o Barrett's on PPI, doing well.  Needs refill.    Hyperglycemia. Now with A1c 6.5.  D/w pt.  No meds for sugar at this point.  Has been eating more carbs recently.    PMH and SH reviewed  Meds, vitals, and allergies reviewed.   ROS: See HPI.  Otherwise negative.    GEN: nad, alert and oriented HEENT: mucous membranes  moist NECK: supple w/o LA CV: rrr. PULM: ctab, no inc wob ABD: soft, +bs EXT: no edema SKIN: no acute rash

## 2015-03-04 NOTE — Assessment & Plan Note (Addendum)
Flu 2016 Shingles 2013 PNA 2016 Tetanus 2008 Colonoscopy due 2017 Prostate cancer screening and PSA options (with potential risks and benefits of testing vs not testing) were discussed along with recent recs/guidelines.  He declined testing PSA at this point. Advance directive- wife designated if patient were incapacitated.   Cognitive function addressed- see scanned forms- and if abnormal then additional documentation follows.  Pt opts in for HCV and HIV screening.  D/w pt re: routine screening with next set of labs.   Opts in for AAA screening.

## 2015-03-04 NOTE — Assessment & Plan Note (Signed)
Needs weight loss and low carb diet.  D/w pt. Recheck in a few months.  He agrees.

## 2015-03-04 NOTE — Assessment & Plan Note (Signed)
Continue PPI ?

## 2015-03-28 ENCOUNTER — Ambulatory Visit: Payer: Medicare Other

## 2015-03-28 DIAGNOSIS — Z87891 Personal history of nicotine dependence: Secondary | ICD-10-CM | POA: Diagnosis not present

## 2015-03-28 DIAGNOSIS — Z136 Encounter for screening for cardiovascular disorders: Secondary | ICD-10-CM | POA: Diagnosis not present

## 2015-04-15 DIAGNOSIS — L570 Actinic keratosis: Secondary | ICD-10-CM | POA: Diagnosis not present

## 2015-04-15 DIAGNOSIS — C44319 Basal cell carcinoma of skin of other parts of face: Secondary | ICD-10-CM | POA: Diagnosis not present

## 2015-04-15 DIAGNOSIS — X32XXXA Exposure to sunlight, initial encounter: Secondary | ICD-10-CM | POA: Diagnosis not present

## 2015-04-15 DIAGNOSIS — C44712 Basal cell carcinoma of skin of right lower limb, including hip: Secondary | ICD-10-CM | POA: Diagnosis not present

## 2015-04-15 DIAGNOSIS — Z85828 Personal history of other malignant neoplasm of skin: Secondary | ICD-10-CM | POA: Diagnosis not present

## 2015-04-15 DIAGNOSIS — C44719 Basal cell carcinoma of skin of left lower limb, including hip: Secondary | ICD-10-CM | POA: Diagnosis not present

## 2015-04-15 DIAGNOSIS — D485 Neoplasm of uncertain behavior of skin: Secondary | ICD-10-CM | POA: Diagnosis not present

## 2015-04-29 DIAGNOSIS — H0015 Chalazion left lower eyelid: Secondary | ICD-10-CM | POA: Diagnosis not present

## 2015-06-06 DIAGNOSIS — C44319 Basal cell carcinoma of skin of other parts of face: Secondary | ICD-10-CM | POA: Diagnosis not present

## 2015-06-06 DIAGNOSIS — L905 Scar conditions and fibrosis of skin: Secondary | ICD-10-CM | POA: Diagnosis not present

## 2015-06-16 DIAGNOSIS — C44712 Basal cell carcinoma of skin of right lower limb, including hip: Secondary | ICD-10-CM | POA: Diagnosis not present

## 2015-06-16 DIAGNOSIS — C44719 Basal cell carcinoma of skin of left lower limb, including hip: Secondary | ICD-10-CM | POA: Diagnosis not present

## 2015-09-01 ENCOUNTER — Other Ambulatory Visit (INDEPENDENT_AMBULATORY_CARE_PROVIDER_SITE_OTHER): Payer: Medicare Other

## 2015-09-01 ENCOUNTER — Other Ambulatory Visit: Payer: Self-pay | Admitting: Family Medicine

## 2015-09-01 DIAGNOSIS — R739 Hyperglycemia, unspecified: Secondary | ICD-10-CM | POA: Diagnosis not present

## 2015-09-01 DIAGNOSIS — Z1159 Encounter for screening for other viral diseases: Secondary | ICD-10-CM | POA: Diagnosis not present

## 2015-09-01 DIAGNOSIS — Z119 Encounter for screening for infectious and parasitic diseases, unspecified: Secondary | ICD-10-CM | POA: Diagnosis not present

## 2015-09-01 DIAGNOSIS — Z114 Encounter for screening for human immunodeficiency virus [HIV]: Secondary | ICD-10-CM

## 2015-09-01 LAB — HEMOGLOBIN A1C: Hgb A1c MFr Bld: 6.7 % — ABNORMAL HIGH (ref 4.6–6.5)

## 2015-09-02 LAB — HIV ANTIBODY (ROUTINE TESTING W REFLEX): HIV 1&2 Ab, 4th Generation: NONREACTIVE

## 2015-09-02 LAB — HEPATITIS C ANTIBODY: HCV AB: NEGATIVE

## 2015-09-15 DIAGNOSIS — Z85828 Personal history of other malignant neoplasm of skin: Secondary | ICD-10-CM | POA: Diagnosis not present

## 2015-09-15 DIAGNOSIS — C44319 Basal cell carcinoma of skin of other parts of face: Secondary | ICD-10-CM | POA: Diagnosis not present

## 2015-09-15 DIAGNOSIS — L570 Actinic keratosis: Secondary | ICD-10-CM | POA: Diagnosis not present

## 2015-09-15 DIAGNOSIS — L821 Other seborrheic keratosis: Secondary | ICD-10-CM | POA: Diagnosis not present

## 2015-09-15 DIAGNOSIS — X32XXXA Exposure to sunlight, initial encounter: Secondary | ICD-10-CM | POA: Diagnosis not present

## 2015-09-15 DIAGNOSIS — Z08 Encounter for follow-up examination after completed treatment for malignant neoplasm: Secondary | ICD-10-CM | POA: Diagnosis not present

## 2015-09-15 DIAGNOSIS — L538 Other specified erythematous conditions: Secondary | ICD-10-CM | POA: Diagnosis not present

## 2015-09-15 DIAGNOSIS — D485 Neoplasm of uncertain behavior of skin: Secondary | ICD-10-CM | POA: Diagnosis not present

## 2015-09-15 DIAGNOSIS — L820 Inflamed seborrheic keratosis: Secondary | ICD-10-CM | POA: Diagnosis not present

## 2015-11-19 DIAGNOSIS — L905 Scar conditions and fibrosis of skin: Secondary | ICD-10-CM | POA: Diagnosis not present

## 2015-11-19 DIAGNOSIS — C44319 Basal cell carcinoma of skin of other parts of face: Secondary | ICD-10-CM | POA: Diagnosis not present

## 2016-02-10 DIAGNOSIS — E119 Type 2 diabetes mellitus without complications: Secondary | ICD-10-CM | POA: Diagnosis not present

## 2016-02-19 DIAGNOSIS — Z85828 Personal history of other malignant neoplasm of skin: Secondary | ICD-10-CM | POA: Diagnosis not present

## 2016-02-19 DIAGNOSIS — C44519 Basal cell carcinoma of skin of other part of trunk: Secondary | ICD-10-CM | POA: Diagnosis not present

## 2016-02-19 DIAGNOSIS — C44319 Basal cell carcinoma of skin of other parts of face: Secondary | ICD-10-CM | POA: Diagnosis not present

## 2016-02-19 DIAGNOSIS — L570 Actinic keratosis: Secondary | ICD-10-CM | POA: Diagnosis not present

## 2016-02-19 DIAGNOSIS — X32XXXA Exposure to sunlight, initial encounter: Secondary | ICD-10-CM | POA: Diagnosis not present

## 2016-02-19 DIAGNOSIS — C44329 Squamous cell carcinoma of skin of other parts of face: Secondary | ICD-10-CM | POA: Diagnosis not present

## 2016-02-19 DIAGNOSIS — D485 Neoplasm of uncertain behavior of skin: Secondary | ICD-10-CM | POA: Diagnosis not present

## 2016-02-23 ENCOUNTER — Other Ambulatory Visit: Payer: Self-pay | Admitting: Family Medicine

## 2016-02-23 DIAGNOSIS — Z8639 Personal history of other endocrine, nutritional and metabolic disease: Secondary | ICD-10-CM

## 2016-02-23 DIAGNOSIS — E786 Lipoprotein deficiency: Secondary | ICD-10-CM

## 2016-02-25 ENCOUNTER — Other Ambulatory Visit (INDEPENDENT_AMBULATORY_CARE_PROVIDER_SITE_OTHER): Payer: Medicare Other

## 2016-02-25 DIAGNOSIS — Z8639 Personal history of other endocrine, nutritional and metabolic disease: Secondary | ICD-10-CM

## 2016-02-25 DIAGNOSIS — E786 Lipoprotein deficiency: Secondary | ICD-10-CM

## 2016-02-25 LAB — HEMOGLOBIN A1C: Hgb A1c MFr Bld: 6.5 % (ref 4.6–6.5)

## 2016-02-25 LAB — COMPREHENSIVE METABOLIC PANEL
ALBUMIN: 4.2 g/dL (ref 3.5–5.2)
ALK PHOS: 39 U/L (ref 39–117)
ALT: 27 U/L (ref 0–53)
AST: 17 U/L (ref 0–37)
BILIRUBIN TOTAL: 0.5 mg/dL (ref 0.2–1.2)
BUN: 22 mg/dL (ref 6–23)
CALCIUM: 9.6 mg/dL (ref 8.4–10.5)
CO2: 29 mEq/L (ref 19–32)
CREATININE: 1.17 mg/dL (ref 0.40–1.50)
Chloride: 104 mEq/L (ref 96–112)
GFR: 66.13 mL/min (ref >60.00)
Glucose, Bld: 121 mg/dL — ABNORMAL HIGH (ref 70–99)
Potassium: 4.5 mEq/L (ref 3.5–5.1)
Sodium: 138 mEq/L (ref 135–145)
TOTAL PROTEIN: 7.2 g/dL (ref 6.0–8.3)

## 2016-02-25 LAB — LIPID PANEL
CHOLESTEROL: 132 mg/dL (ref 0–200)
HDL: 43.2 mg/dL (ref >39.00)
LDL Cholesterol: 67 mg/dL (ref 0–99)
NonHDL: 88.91
TRIGLYCERIDES: 112 mg/dL (ref 0.0–149.0)
Total CHOL/HDL Ratio: 3
VLDL: 22.4 mg/dL (ref 0.0–40.0)

## 2016-02-25 LAB — MICROALBUMIN / CREATININE URINE RATIO
CREATININE, U: 175.1 mg/dL
MICROALB/CREAT RATIO: 0.4 mg/g (ref 0.0–30.0)

## 2016-03-04 ENCOUNTER — Ambulatory Visit (INDEPENDENT_AMBULATORY_CARE_PROVIDER_SITE_OTHER): Payer: Medicare Other | Admitting: Family Medicine

## 2016-03-04 ENCOUNTER — Encounter: Payer: Self-pay | Admitting: Family Medicine

## 2016-03-04 VITALS — BP 106/60 | HR 69 | Temp 97.7°F | Ht 71.0 in | Wt 214.8 lb

## 2016-03-04 DIAGNOSIS — Z0000 Encounter for general adult medical examination without abnormal findings: Secondary | ICD-10-CM | POA: Diagnosis not present

## 2016-03-04 DIAGNOSIS — R739 Hyperglycemia, unspecified: Secondary | ICD-10-CM

## 2016-03-04 DIAGNOSIS — K2270 Barrett's esophagus without dysplasia: Secondary | ICD-10-CM

## 2016-03-04 DIAGNOSIS — Z8639 Personal history of other endocrine, nutritional and metabolic disease: Secondary | ICD-10-CM | POA: Diagnosis not present

## 2016-03-04 DIAGNOSIS — Z23 Encounter for immunization: Secondary | ICD-10-CM | POA: Diagnosis not present

## 2016-03-04 MED ORDER — PANTOPRAZOLE SODIUM 40 MG PO TBEC: DELAYED_RELEASE_TABLET | ORAL | 3 refills | Status: DC

## 2016-03-04 NOTE — Patient Instructions (Signed)
Use the eat right diet and try to work on your weight.  Recheck in about 6 months.  Lab visit only.   We'll go from there.   Likely physical in about 1 year.  Take care.  Glad to see you.

## 2016-03-04 NOTE — Progress Notes (Signed)
I have personally reviewed the Medicare Annual Wellness questionnaire and have noted 1. The patient's medical and social history 2. Their use of alcohol, tobacco or illicit drugs 3. Their current medications and supplements 4. The patient's functional ability including ADL's, fall risks, home safety risks and hearing or visual             impairment. 5. Diet and physical activities 6. Evidence for depression or mood disorders  The patients weight, height, BMI have been recorded in the chart and visual acuity is per eye clinic.  I have made referrals, counseling and provided education to the patient based review of the above and I have provided the pt with a written personalized care plan for preventive services.  Provider list updated- see scanned forms.  Routine anticipatory guidance given to patient.  See health maintenance.  Flu 2017 Shingles 2013 PNA 2017 Tetanus 2008 Colon- pending with GI, deferred until 2018, to coincide with EGD.  He has d/w pt GI prev.   Prostate cancer screening and PSA options (with potential risks and benefits of testing vs not testing) were discussed along with recent recs/guidelines.  He declined testing PSA at this point. Advance directive- wife designated if patient were incapacitated. Cognitive function addressed- see scanned forms- and if abnormal then additional documentation follows.  AAA screening prev done, d/w pt.   Hyperglycemia.  Borderline not have DM2.  Improved from prev.  D/w pt about diet and specifically eat right diet.  Recheck A1c in about 6 months.   GERD.  Controlled on PPI.   No ADE.  D/w pt about Cr, stable.  He has plan for f/u EGD next year.  No blood in stool.  No abd pain.     PMH and SH reviewed  Meds, vitals, and allergies reviewed.   ROS: Per HPI.  Unless specifically indicated otherwise in HPI, the patient denies:  General: fever. Eyes: acute vision changes ENT: sore throat Cardiovascular: chest pain Respiratory:  SOB GI: vomiting GU: dysuria Musculoskeletal: acute back pain Derm: acute rash Neuro: acute motor dysfunction Psych: worsening mood Endocrine: polydipsia Heme: bleeding Allergy: hayfever  GEN: nad, alert and oriented HEENT: mucous membranes moist NECK: supple w/o LA CV: rrr. PULM: ctab, no inc wob ABD: soft, +bs EXT: no edema SKIN: no acute rash

## 2016-03-04 NOTE — Progress Notes (Signed)
Pre visit review using our clinic review tool, if applicable. No additional management support is needed unless otherwise documented below in the visit note. 

## 2016-03-05 NOTE — Assessment & Plan Note (Signed)
D/w pt about labs and diet and exercise.  Recheck in about 6 months.  See AVS.  He agrees.

## 2016-03-05 NOTE — Assessment & Plan Note (Signed)
Controlled on PPI.   No ADE.  D/w pt about Cr, stable.  He has plan for f/u EGD next year.  No blood in stool.  No abd pain.    Continue as is.

## 2016-03-05 NOTE — Assessment & Plan Note (Signed)
Flu 2017 Shingles 2013 PNA 2017 Tetanus 2008 Colon- pending with GI, deferred until 2018, to coincide with EGD.  He has d/w pt GI prev.   Prostate cancer screening and PSA options (with potential risks and benefits of testing vs not testing) were discussed along with recent recs/guidelines.  He declined testing PSA at this point. Advance directive- wife designated if patient were incapacitated. Cognitive function addressed- see scanned forms- and if abnormal then additional documentation follows.  AAA screening prev done, d/w pt.

## 2016-05-14 DIAGNOSIS — C44329 Squamous cell carcinoma of skin of other parts of face: Secondary | ICD-10-CM | POA: Diagnosis not present

## 2016-05-14 DIAGNOSIS — L905 Scar conditions and fibrosis of skin: Secondary | ICD-10-CM | POA: Diagnosis not present

## 2016-05-14 DIAGNOSIS — C44319 Basal cell carcinoma of skin of other parts of face: Secondary | ICD-10-CM | POA: Diagnosis not present

## 2016-09-02 ENCOUNTER — Other Ambulatory Visit (INDEPENDENT_AMBULATORY_CARE_PROVIDER_SITE_OTHER): Payer: Medicare Other

## 2016-09-02 DIAGNOSIS — R739 Hyperglycemia, unspecified: Secondary | ICD-10-CM

## 2016-09-02 LAB — HEMOGLOBIN A1C: HEMOGLOBIN A1C: 6.6 % — AB (ref 4.6–6.5)

## 2016-09-17 DIAGNOSIS — Z08 Encounter for follow-up examination after completed treatment for malignant neoplasm: Secondary | ICD-10-CM | POA: Diagnosis not present

## 2016-09-17 DIAGNOSIS — D485 Neoplasm of uncertain behavior of skin: Secondary | ICD-10-CM | POA: Diagnosis not present

## 2016-09-17 DIAGNOSIS — C44719 Basal cell carcinoma of skin of left lower limb, including hip: Secondary | ICD-10-CM | POA: Diagnosis not present

## 2016-09-17 DIAGNOSIS — C44319 Basal cell carcinoma of skin of other parts of face: Secondary | ICD-10-CM | POA: Diagnosis not present

## 2016-09-17 DIAGNOSIS — Z85828 Personal history of other malignant neoplasm of skin: Secondary | ICD-10-CM | POA: Diagnosis not present

## 2016-09-17 DIAGNOSIS — C44311 Basal cell carcinoma of skin of nose: Secondary | ICD-10-CM | POA: Diagnosis not present

## 2016-09-17 DIAGNOSIS — X32XXXA Exposure to sunlight, initial encounter: Secondary | ICD-10-CM | POA: Diagnosis not present

## 2016-09-17 DIAGNOSIS — L570 Actinic keratosis: Secondary | ICD-10-CM | POA: Diagnosis not present

## 2016-10-27 DIAGNOSIS — C44319 Basal cell carcinoma of skin of other parts of face: Secondary | ICD-10-CM | POA: Diagnosis not present

## 2016-10-27 DIAGNOSIS — C44311 Basal cell carcinoma of skin of nose: Secondary | ICD-10-CM | POA: Diagnosis not present

## 2016-11-04 DIAGNOSIS — L905 Scar conditions and fibrosis of skin: Secondary | ICD-10-CM | POA: Diagnosis not present

## 2016-11-04 DIAGNOSIS — C44319 Basal cell carcinoma of skin of other parts of face: Secondary | ICD-10-CM | POA: Diagnosis not present

## 2016-11-11 DIAGNOSIS — C44719 Basal cell carcinoma of skin of left lower limb, including hip: Secondary | ICD-10-CM | POA: Diagnosis not present

## 2017-02-07 DIAGNOSIS — K2270 Barrett's esophagus without dysplasia: Secondary | ICD-10-CM | POA: Diagnosis not present

## 2017-02-07 DIAGNOSIS — K635 Polyp of colon: Secondary | ICD-10-CM | POA: Diagnosis not present

## 2017-02-10 DIAGNOSIS — Z961 Presence of intraocular lens: Secondary | ICD-10-CM | POA: Diagnosis not present

## 2017-02-27 ENCOUNTER — Other Ambulatory Visit: Payer: Self-pay | Admitting: Family Medicine

## 2017-02-27 DIAGNOSIS — E119 Type 2 diabetes mellitus without complications: Secondary | ICD-10-CM

## 2017-02-27 DIAGNOSIS — Z8639 Personal history of other endocrine, nutritional and metabolic disease: Secondary | ICD-10-CM

## 2017-02-27 DIAGNOSIS — Z8249 Family history of ischemic heart disease and other diseases of the circulatory system: Secondary | ICD-10-CM

## 2017-03-01 DIAGNOSIS — Z86010 Personal history of colonic polyps: Secondary | ICD-10-CM | POA: Diagnosis not present

## 2017-03-01 DIAGNOSIS — Z800 Family history of malignant neoplasm of digestive organs: Secondary | ICD-10-CM | POA: Diagnosis not present

## 2017-03-01 DIAGNOSIS — K640 First degree hemorrhoids: Secondary | ICD-10-CM | POA: Diagnosis not present

## 2017-03-01 DIAGNOSIS — D123 Benign neoplasm of transverse colon: Secondary | ICD-10-CM | POA: Diagnosis not present

## 2017-03-01 DIAGNOSIS — K635 Polyp of colon: Secondary | ICD-10-CM | POA: Diagnosis not present

## 2017-03-01 DIAGNOSIS — K319 Disease of stomach and duodenum, unspecified: Secondary | ICD-10-CM | POA: Diagnosis not present

## 2017-03-01 DIAGNOSIS — K2970 Gastritis, unspecified, without bleeding: Secondary | ICD-10-CM | POA: Diagnosis not present

## 2017-03-01 DIAGNOSIS — K2270 Barrett's esophagus without dysplasia: Secondary | ICD-10-CM | POA: Diagnosis not present

## 2017-03-03 ENCOUNTER — Other Ambulatory Visit (INDEPENDENT_AMBULATORY_CARE_PROVIDER_SITE_OTHER): Payer: Medicare Other

## 2017-03-03 DIAGNOSIS — E119 Type 2 diabetes mellitus without complications: Secondary | ICD-10-CM | POA: Diagnosis not present

## 2017-03-03 LAB — COMPREHENSIVE METABOLIC PANEL
ALBUMIN: 4.1 g/dL (ref 3.5–5.2)
ALK PHOS: 34 U/L — AB (ref 39–117)
ALT: 28 U/L (ref 0–53)
AST: 23 U/L (ref 0–37)
BILIRUBIN TOTAL: 0.5 mg/dL (ref 0.2–1.2)
BUN: 18 mg/dL (ref 6–23)
CALCIUM: 9.1 mg/dL (ref 8.4–10.5)
CO2: 29 mEq/L (ref 19–32)
Chloride: 106 mEq/L (ref 96–112)
Creatinine, Ser: 1.22 mg/dL (ref 0.40–1.50)
GFR: 62.82 mL/min (ref >60.00)
GLUCOSE: 129 mg/dL — AB (ref 70–99)
Potassium: 4.3 mEq/L (ref 3.5–5.1)
Sodium: 140 mEq/L (ref 135–145)
TOTAL PROTEIN: 6.7 g/dL (ref 6.0–8.3)

## 2017-03-03 LAB — LIPID PANEL
Cholesterol: 109 mg/dL (ref 0–200)
HDL: 39.8 mg/dL (ref >39.00)
LDL Cholesterol: 54 mg/dL (ref 0–99)
NONHDL: 69.39
TRIGLYCERIDES: 75 mg/dL (ref 0.0–149.0)
Total CHOL/HDL Ratio: 3
VLDL: 15 mg/dL (ref 0.0–40.0)

## 2017-03-03 LAB — MICROALBUMIN / CREATININE URINE RATIO
CREATININE, U: 254.4 mg/dL
MICROALB/CREAT RATIO: 0.3 mg/g (ref 0.0–30.0)
Microalb, Ur: 0.9 mg/dL (ref 0.0–1.9)

## 2017-03-03 LAB — HEMOGLOBIN A1C: Hgb A1c MFr Bld: 6.7 % — ABNORMAL HIGH (ref 4.6–6.5)

## 2017-03-08 ENCOUNTER — Ambulatory Visit (INDEPENDENT_AMBULATORY_CARE_PROVIDER_SITE_OTHER): Payer: Medicare Other | Admitting: Family Medicine

## 2017-03-08 ENCOUNTER — Encounter: Payer: Self-pay | Admitting: Family Medicine

## 2017-03-08 VITALS — BP 120/60 | HR 87 | Temp 97.8°F | Ht 71.0 in | Wt 220.2 lb

## 2017-03-08 DIAGNOSIS — Z7189 Other specified counseling: Secondary | ICD-10-CM

## 2017-03-08 DIAGNOSIS — E119 Type 2 diabetes mellitus without complications: Secondary | ICD-10-CM

## 2017-03-08 DIAGNOSIS — K2270 Barrett's esophagus without dysplasia: Secondary | ICD-10-CM | POA: Diagnosis not present

## 2017-03-08 DIAGNOSIS — F17200 Nicotine dependence, unspecified, uncomplicated: Secondary | ICD-10-CM

## 2017-03-08 DIAGNOSIS — Z0000 Encounter for general adult medical examination without abnormal findings: Secondary | ICD-10-CM

## 2017-03-08 DIAGNOSIS — Z23 Encounter for immunization: Secondary | ICD-10-CM

## 2017-03-08 MED ORDER — PANTOPRAZOLE SODIUM 40 MG PO TBEC: DELAYED_RELEASE_TABLET | ORAL | 3 refills | Status: DC

## 2017-03-08 NOTE — Progress Notes (Signed)
I have personally reviewed the Medicare Annual Wellness questionnaire and have noted 1. The patient's medical and social history 2. Their use of alcohol, tobacco or illicit drugs 3. Their current medications and supplements 4. The patient's functional ability including ADL's, fall risks, home safety risks and hearing or visual             impairment. 5. Diet and physical activities 6. Evidence for depression or mood disorders  The patients weight, height, BMI have been recorded in the chart and visual acuity is per eye clinic.  I have made referrals, counseling and provided education to the patient based review of the above and I have provided the pt with a written personalized care plan for preventive services.  Provider list updated- see scanned forms.  Routine anticipatory guidance given to patient.  See health maintenance. The possibility exists that previously documented standard health maintenance information may have been brought forward from a previous encounter into this note.  If needed, that same information has been updated to reflect the current situation based on today's encounter.    Flu 2018 Shingles 2013, d/w pt.  See AVS.  PNA UTD Tetanus 2008, has had 3 total in lifetime.  Wouldn't need repeat tetanus dose at this point.   Colon and EGD done via [facility] 02/2017.   Prostate cancer screening and PSA options (with potential risks and benefits of testing vs not testing) were discussed along with recent recs/guidelines.  He declined testing PSA at this point but he'll consider testing in the future.   Advance directive- wife designated if patient were incapacitated. Cognitive function addressed- see scanned forms- and if abnormal then additional documentation follows.  AAA screening prev done, d/w pt.  Lung cancer screening d/w pt.  30+ pack year.  He opts in.  See orders. Smoking about 6 cigs a day.  D/w pt about cessation. Precontemplative.  He tried E cig but couldn't  tolerate it.    Hyperglycemia/DM2.  D/w pt about diet and exercise.  No meds.  D/w pt about labs.    GERD/barretts.  Controlled on PPI.   No ADE.  D/w pt about Cr, stable.   No blood in stool.  No abd pain.     PMH and SH reviewed  Meds, vitals, and allergies reviewed.   ROS: Per HPI.  Unless specifically indicated otherwise in HPI, the patient denies:  General: fever. Eyes: acute vision changes ENT: sore throat Cardiovascular: chest pain Respiratory: SOB GI: vomiting GU: dysuria Musculoskeletal: acute back pain Derm: acute rash Neuro: acute motor dysfunction Psych: worsening mood Endocrine: polydipsia Heme: bleeding Allergy: hayfever  GEN: nad, alert and oriented HEENT: mucous membranes moist NECK: supple w/o LA CV: rrr. PULM: ctab, no inc wob ABD: soft, +bs EXT: no edema SKIN: no acute rash  Diabetic foot exam: Normal inspection No skin breakdown No calluses  Normal DP pulses Normal sensation to light touch and monofilament Nails normal except for R 1st nail not ingrown but thick

## 2017-03-08 NOTE — Patient Instructions (Addendum)
Check with your insurance to see if they will cover the shingrix shot. Brian Hull will call about your referral. Let me know if you want to get the PSA check on follow up labs.  Recheck A1c in about 6 months prior to a visit.  Less ice cream and more walking.   Take care.  Glad to see you.

## 2017-03-09 NOTE — Assessment & Plan Note (Signed)
Would continue PPI, no ADE on med.  D/w pt.  He agrees.  Has seen GI.

## 2017-03-09 NOTE — Assessment & Plan Note (Signed)
Advance directive- wife designated if patient were incapacitated.  

## 2017-03-09 NOTE — Assessment & Plan Note (Signed)
D/w pt about diet and exercise.  Less ice cream and more walking.  Recheck A1c in about 6 months prior to a visit.  No meds currently needed.  D/w pt about labs.  Foot exam done today.  He agrees with plan.

## 2017-03-09 NOTE — Assessment & Plan Note (Signed)
D/w pt about cessation.  Precontemplative.

## 2017-03-09 NOTE — Assessment & Plan Note (Signed)
Flu 2018 Shingles 2013, d/w pt.  See AVS.  PNA UTD Tetanus 2008, has had 3 total in lifetime.  Wouldn't need repeat tetanus dose at this point.   Colon and EGD done via [facility] clinic 02/2017.   Prostate cancer screening and PSA options (with potential risks and benefits of testing vs not testing) were discussed along with recent recs/guidelines.  He declined testing PSA at this point but he'll consider testing in the future.   Advance directive- wife designated if patient were incapacitated. Cognitive function addressed- see scanned forms- and if abnormal then additional documentation follows.  AAA screening prev done, d/w pt.  Lung cancer screening d/w pt.  30+ pack year.  He opts in.  See orders. Smoking about 6 cigs a day.  D/w pt about cessation. Precontemplative.  He tried E cig but couldn't tolerate it.

## 2017-03-14 DIAGNOSIS — C44319 Basal cell carcinoma of skin of other parts of face: Secondary | ICD-10-CM | POA: Diagnosis not present

## 2017-03-14 DIAGNOSIS — L570 Actinic keratosis: Secondary | ICD-10-CM | POA: Diagnosis not present

## 2017-03-14 DIAGNOSIS — X32XXXA Exposure to sunlight, initial encounter: Secondary | ICD-10-CM | POA: Diagnosis not present

## 2017-03-14 DIAGNOSIS — C44619 Basal cell carcinoma of skin of left upper limb, including shoulder: Secondary | ICD-10-CM | POA: Diagnosis not present

## 2017-03-14 DIAGNOSIS — L853 Xerosis cutis: Secondary | ICD-10-CM | POA: Diagnosis not present

## 2017-03-14 DIAGNOSIS — Z08 Encounter for follow-up examination after completed treatment for malignant neoplasm: Secondary | ICD-10-CM | POA: Diagnosis not present

## 2017-03-14 DIAGNOSIS — C441121 Basal cell carcinoma of skin of right upper eyelid, including canthus: Secondary | ICD-10-CM | POA: Diagnosis not present

## 2017-03-14 DIAGNOSIS — Z85828 Personal history of other malignant neoplasm of skin: Secondary | ICD-10-CM | POA: Diagnosis not present

## 2017-03-14 DIAGNOSIS — D485 Neoplasm of uncertain behavior of skin: Secondary | ICD-10-CM | POA: Diagnosis not present

## 2017-03-15 ENCOUNTER — Telehealth: Payer: Self-pay | Admitting: *Deleted

## 2017-03-15 DIAGNOSIS — Z87891 Personal history of nicotine dependence: Secondary | ICD-10-CM

## 2017-03-15 DIAGNOSIS — Z122 Encounter for screening for malignant neoplasm of respiratory organs: Secondary | ICD-10-CM

## 2017-03-15 NOTE — Telephone Encounter (Signed)
Received referral for initial lung cancer screening scan. Contacted patient and obtained smoking history,(current, 55 pack year) as well as answering questions related to screening process. Patient denies signs of lung cancer such as weight loss or hemoptysis. Patient denies comorbidity that would prevent curative treatment if lung cancer were found. Patient is scheduled for shared decision making visit and CT scan on 03/23/17.

## 2017-03-23 ENCOUNTER — Inpatient Hospital Stay: Payer: Medicare Other | Attending: Oncology | Admitting: Oncology

## 2017-03-23 ENCOUNTER — Encounter: Payer: Self-pay | Admitting: Oncology

## 2017-03-23 ENCOUNTER — Ambulatory Visit
Admission: RE | Admit: 2017-03-23 | Discharge: 2017-03-23 | Disposition: A | Discharge status: Home / Self Care | Payer: Medicare Other | Source: Ambulatory Visit | Attending: Oncology | Admitting: Oncology

## 2017-03-23 DIAGNOSIS — Z122 Encounter for screening for malignant neoplasm of respiratory organs: Secondary | ICD-10-CM | POA: Diagnosis not present

## 2017-03-23 DIAGNOSIS — Z87891 Personal history of nicotine dependence: Secondary | ICD-10-CM

## 2017-03-23 DIAGNOSIS — F17210 Nicotine dependence, cigarettes, uncomplicated: Secondary | ICD-10-CM

## 2017-03-23 NOTE — Progress Notes (Signed)
In accordance with CMS guidelines, patient has met eligibility criteria including age, absence of signs or symptoms of lung cancer.  Social History   Tobacco Use  . Smoking status: Current Every Day Smoker    Packs/day: 1.00    Years: 55.00    Pack years: 55.00  . Smokeless tobacco: Current User    Types: Snuff  . Tobacco comment: using Ecig 2015  Substance Use Topics  . Alcohol use: No    Alcohol/week: 0.0 oz  . Drug use: No     A shared decision-making session was conducted prior to the performance of CT scan. This includes one or more decision aids, includes benefits and harms of screening, follow-up diagnostic testing, over-diagnosis, false positive rate, and total radiation exposure.  Counseling on the importance of adherence to annual lung cancer LDCT screening, impact of co-morbidities, and ability or willingness to undergo diagnosis and treatment is imperative for compliance of the program.  Counseling on the importance of continued smoking cessation for former smokers; the importance of smoking cessation for current smokers, and information about tobacco cessation interventions have been given to patient including [place] and [employer] quit [place] programs.  Written order for lung cancer screening with LDCT has been given to the patient and any and all questions have been answered to the best of my abilities.   Yearly follow up will be coordinated by Burgess Estelle, Thoracic Navigator.  Faythe Casa, NP 03/23/2017 2:44 PM

## 2017-03-25 ENCOUNTER — Telehealth: Payer: Self-pay | Admitting: *Deleted

## 2017-03-25 NOTE — Telephone Encounter (Signed)
Notified patient of LDCT lung cancer screening program results with recommendation for 6 month follow up imaging.Patient verbalizes understanding.   IMPRESSION: 1. Lung-RADS 3, probably benign findings. Short-term follow-up in 6 months is recommended with repeat low-dose chest CT without contrast (please use the following order, "CT CHEST LCS NODULE FOLLOW-UP W/O CM").

## 2017-03-27 NOTE — Telephone Encounter (Signed)
Thanks.  I'll defer.  

## 2017-04-26 DIAGNOSIS — C44612 Basal cell carcinoma of skin of right upper limb, including shoulder: Secondary | ICD-10-CM | POA: Diagnosis not present

## 2017-04-26 DIAGNOSIS — L905 Scar conditions and fibrosis of skin: Secondary | ICD-10-CM | POA: Diagnosis not present

## 2017-05-09 DIAGNOSIS — C44612 Basal cell carcinoma of skin of right upper limb, including shoulder: Secondary | ICD-10-CM | POA: Diagnosis not present

## 2017-05-09 DIAGNOSIS — C44629 Squamous cell carcinoma of skin of left upper limb, including shoulder: Secondary | ICD-10-CM | POA: Diagnosis not present

## 2017-05-09 DIAGNOSIS — D485 Neoplasm of uncertain behavior of skin: Secondary | ICD-10-CM | POA: Diagnosis not present

## 2017-05-17 DIAGNOSIS — C441192 Basal cell carcinoma of skin of left lower eyelid, including canthus: Secondary | ICD-10-CM | POA: Diagnosis not present

## 2017-05-17 DIAGNOSIS — C44319 Basal cell carcinoma of skin of other parts of face: Secondary | ICD-10-CM | POA: Diagnosis not present

## 2017-05-17 DIAGNOSIS — C441121 Basal cell carcinoma of skin of right upper eyelid, including canthus: Secondary | ICD-10-CM | POA: Diagnosis not present

## 2017-06-15 DIAGNOSIS — L905 Scar conditions and fibrosis of skin: Secondary | ICD-10-CM | POA: Diagnosis not present

## 2017-06-15 DIAGNOSIS — C44629 Squamous cell carcinoma of skin of left upper limb, including shoulder: Secondary | ICD-10-CM | POA: Diagnosis not present

## 2017-08-08 DIAGNOSIS — Z08 Encounter for follow-up examination after completed treatment for malignant neoplasm: Secondary | ICD-10-CM | POA: Diagnosis not present

## 2017-08-08 DIAGNOSIS — L821 Other seborrheic keratosis: Secondary | ICD-10-CM | POA: Diagnosis not present

## 2017-08-08 DIAGNOSIS — L111 Transient acantholytic dermatosis [Grover]: Secondary | ICD-10-CM | POA: Diagnosis not present

## 2017-08-08 DIAGNOSIS — L570 Actinic keratosis: Secondary | ICD-10-CM | POA: Diagnosis not present

## 2017-08-08 DIAGNOSIS — Z85828 Personal history of other malignant neoplasm of skin: Secondary | ICD-10-CM | POA: Diagnosis not present

## 2017-08-08 DIAGNOSIS — X32XXXA Exposure to sunlight, initial encounter: Secondary | ICD-10-CM | POA: Diagnosis not present

## 2017-09-05 ENCOUNTER — Other Ambulatory Visit: Payer: Self-pay | Admitting: Family Medicine

## 2017-09-05 DIAGNOSIS — E119 Type 2 diabetes mellitus without complications: Secondary | ICD-10-CM

## 2017-09-06 ENCOUNTER — Other Ambulatory Visit (INDEPENDENT_AMBULATORY_CARE_PROVIDER_SITE_OTHER): Payer: Medicare Other

## 2017-09-06 DIAGNOSIS — E119 Type 2 diabetes mellitus without complications: Secondary | ICD-10-CM

## 2017-09-06 LAB — HEMOGLOBIN A1C: HEMOGLOBIN A1C: 6.7 % — AB (ref 4.6–6.5)

## 2017-09-26 ENCOUNTER — Telehealth: Payer: Self-pay | Admitting: *Deleted

## 2017-09-26 DIAGNOSIS — R918 Other nonspecific abnormal finding of lung field: Secondary | ICD-10-CM

## 2017-09-26 DIAGNOSIS — Z122 Encounter for screening for malignant neoplasm of respiratory organs: Secondary | ICD-10-CM

## 2017-09-26 DIAGNOSIS — Z87891 Personal history of nicotine dependence: Secondary | ICD-10-CM

## 2017-09-26 NOTE — Telephone Encounter (Signed)
Notified patient that lung cancer screening low dose CT scan follow up is due currently or will be in near future. Confirmed that patient is within the age range of 55-77, and asymptomatic, (no signs or symptoms of lung cancer). Patient denies illness that would prevent curative treatment for lung cancer if found. Verified smoking history, (current, 55 pack year). The shared decision making visit was done 03/23/17. Patient is agreeable for CT scan being scheduled.

## 2017-09-30 ENCOUNTER — Ambulatory Visit
Admission: RE | Admit: 2017-09-30 | Discharge: 2017-09-30 | Disposition: A | Discharge status: Home / Self Care | Payer: Medicare Other | Source: Ambulatory Visit | Attending: Oncology | Admitting: Oncology

## 2017-09-30 DIAGNOSIS — R918 Other nonspecific abnormal finding of lung field: Secondary | ICD-10-CM | POA: Diagnosis not present

## 2017-09-30 DIAGNOSIS — Z87891 Personal history of nicotine dependence: Secondary | ICD-10-CM | POA: Diagnosis not present

## 2017-09-30 DIAGNOSIS — Z122 Encounter for screening for malignant neoplasm of respiratory organs: Secondary | ICD-10-CM | POA: Insufficient documentation

## 2017-10-05 ENCOUNTER — Encounter: Payer: Self-pay | Admitting: *Deleted

## 2017-10-05 ENCOUNTER — Telehealth: Payer: Self-pay | Admitting: Family Medicine

## 2017-10-05 NOTE — Telephone Encounter (Signed)
Call pt.  Incidental CAD noted on CT chest.  This is common and expected given smoking hx.  We may be able to decrease his risk of MI.  If he'll come in to talk about it, then please schedule non urgent OV.  Thanks.

## 2017-10-05 NOTE — Telephone Encounter (Signed)
Patient and his wife notified as instructed by telephone and verbalized understanding. Follow-up appointment scheduled for 10/18/17.

## 2017-10-18 ENCOUNTER — Encounter: Payer: Self-pay | Admitting: Family Medicine

## 2017-10-18 ENCOUNTER — Ambulatory Visit (INDEPENDENT_AMBULATORY_CARE_PROVIDER_SITE_OTHER): Payer: Medicare Other | Admitting: Family Medicine

## 2017-10-18 VITALS — BP 120/64 | HR 73 | Temp 98.4°F | Ht 71.0 in | Wt 217.8 lb

## 2017-10-18 DIAGNOSIS — I2510 Atherosclerotic heart disease of native coronary artery without angina pectoris: Secondary | ICD-10-CM | POA: Diagnosis not present

## 2017-10-18 NOTE — Progress Notes (Signed)
He is still dipping but quit smoking.  Discussed options.  He didn't tolerate vaping.   CT discussed.  No alarming pulmonary findings.  He'll have routine f/u.   Coronary artery atherosclerosis noted.  He isn't smoking.  DM2 controlled.  Sometimes he'll have occ chest wall pain but not exertional CP. He can get SOB with exertion.  Others have noted that he'll get out of breath, even with housework.    He isn't on statin yet-rationale for use discussed with patient.  Prev LDL 54.  He has occ sweats at night, but not every night.  Happens once every few weeks or even less often.    PMH and SH reviewed  ROS: Per HPI unless specifically indicated in ROS section   Meds, vitals, and allergies reviewed.   GEN: nad, alert and oriented HEENT: mucous membranes moist NECK: supple w/o LA CV: rrr. PULM: ctab, no inc wob ABD: soft, +bs EXT: no edema SKIN: no acute rash

## 2017-10-18 NOTE — Patient Instructions (Signed)
We will call about your referral.  Marion or Anastasiya will call you if you don't see one of them on the way out.  Take care.  Glad to see you.  

## 2017-10-19 DIAGNOSIS — I2510 Atherosclerotic heart disease of native coronary artery without angina pectoris: Secondary | ICD-10-CM | POA: Insufficient documentation

## 2017-10-19 NOTE — Assessment & Plan Note (Signed)
No significant acute changes on EKG. He isn't on statin yet-rationale for use discussed with patient.  Prev LDL 54.  Reasonable to refer to cardiology given the occasional shortness of breath he is having.  We cannot quantify the severity of his coronary disease based on the previous imaging.  I need cardiac input about further testing to delineate his risk and treatment.  He agrees with referral.  Routine cautions given.  Okay for outpatient follow-up. >25 minutes spent in face to face time with patient, >50% spent in counselling or coordination of care.

## 2017-11-22 NOTE — Progress Notes (Signed)
Cardiology Office Note  Date:  11/23/2017   ID:  Brian Gully., DOB 03/28/1951, MRN [id number]  PCP:  Tonia Ghent, MD   Chief Complaint  Patient presents with  . other    Ref by Dr. Damita Dunnings for follow up of CT scan. Pt. c/o chest pain, shortness of breath and night sweats. Meds reviewed by the pt. verbally.     HPI:  Brian Hull is a 67 year old male with past medical history of Former smoker 67 yo  To 2018, now using snuff, previously used vapor cigarettes Diabetes type 2, HBA1C 6.7,  Occasional shortness of breath Who presents by referral from Dr. Damita Dunnings for consultation of his coronary calcification on CT and shortness of breath, chest pain  He reports that he received a letter in the mail from oncology concerning coronary calcifications  Reports that is relatively active but no regular exercise program Rare episodes of chest pain.routinely and spot typically with exertion Feels they are somewhat atypical  Some shortness of breath but not on a flat surface with exertion sometimes happens with hills and stairs. Stable symptoms not getting worse Denies any significant cough, PND orthopnea or leg edema  Rare night sweats 3 times a year will soak the bed Etiology unclear  CT scan chest May 2019 and November 2018 Images pulled up in the office and reviewed with him in detail We could not find much significant coronary calcification or even aortic atherosclerosis, no carotid calcification Both CT scans were reviewed in detail Radiology report result was reviewed and again did not detail any aortic atherosclerosis or coronary calcification  EKG personally reviewed by myself on todays visit Shows normal sinus rhythm rate 67 bpm no significant ST or T-wave changes  Recent Vital Signs   BP 136/70 (BP Location: Right Arm, Patient Position: Sitting, Cuff Size: Normal)   Pulse 67   Ht 5\' 11"  (1.803 m)   Wt 219 lb 8 oz (99.6 kg)   BMI 30.61 kg/m    Past Medical  History:  Diagnosis Date  . Amputation, thumb, traumatic 1968   tip amputated   . Barrett's esophagus 05/08/02, 02/15/05, 2009   EGD- Barrett's esoph, HH, gastritis, diverticulitis  . Basal cell cancer 05/2000   of nose, face (seen at [facility] 2015)  . Diabetes mellitus without complication ([place])   . Esophageal reflux   . History of colon polyps   . Hypertension   . Lipoma of unspecified site   . Pure hypercholesterolemia   . Tobacco use disorder   . Unspecified gastritis and gastroduodenitis without mention of hemorrhage   . Wrist fracture, left    as a child    PMH:   has a past medical history of Amputation, thumb, traumatic (1968), Barrett's esophagus (05/08/02, 02/15/05, 2009), Basal cell cancer (05/2000), Diabetes mellitus without complication ([place]), Esophageal reflux, History of colon polyps, Hypertension, Lipoma of unspecified site, Pure hypercholesterolemia, Tobacco use disorder, Unspecified gastritis and gastroduodenitis without mention of hemorrhage, and Wrist fracture, left.  PSH:    Past Surgical History:  Procedure Laterality Date  . CATARACT EXTRACTION     2012  . CHOLECYSTECTOMY, LAPAROSCOPIC  12/20/06   [place]  . EYE SURGERY     trauma to right eye as a child, growth removed from eye 2 years later  . LAMINECTOMY  1984   L 4/5  . UPPER GI ENDOSCOPY  9/205    Current Outpatient Medications  Medication Sig Dispense Refill  . pantoprazole (PROTONIX) 40 MG  tablet TAKE (1) TABLET BY MOUTH EVERY DAY 90 tablet 3   No current facility-administered medications for this visit.      Allergies:   Azithromycin; Zithromax [azithromycin dihydrate]; [name]; [name]   Social History:  The patient  reports that he quit smoking about 7 months ago. He has a 55.00 pack-year smoking history. His smokeless tobacco use includes snuff. He reports that he does not drink alcohol or use drugs.   Family History:   family history includes Alzheimer's disease in his mother; Cancer in  his brother, father, and sister; Colon cancer in his father; Colon polyps in his brother, sister, and sister; Diabetes in his brother, father, and mother; Heart disease in his father and mother; Hypertension in his father, sister, and sister; Stroke in his father.    Review of Systems: Review of Systems  Constitutional: Negative.   Respiratory: Positive for shortness of breath.   Cardiovascular: Negative.   Gastrointestinal: Negative.   Musculoskeletal: Negative.   Neurological: Negative.   Psychiatric/Behavioral: Negative.   All other systems reviewed and are negative.    PHYSICAL EXAM: VS:  BP 136/70 (BP Location: Right Arm, Patient Position: Sitting, Cuff Size: Normal)   Pulse 67   Ht 5\' 11"  (1.803 m)   Wt 219 lb 8 oz (99.6 kg)   BMI 30.61 kg/m  , BMI Body mass index is 30.61 kg/m. GEN: Well nourished, well developed, in no acute distress  HEENT: normal  Neck: no JVD, carotid bruits, or masses Cardiac: RRR; no murmurs, rubs, or gallops,no edema  Respiratory:  clear to auscultation bilaterally, normal work of breathing GI: soft, nontender, nondistended, + BS MS: no deformity or atrophy  Skin: warm and dry, no rash Neuro:  Strength and sensation are intact Psych: euthymic mood, full affect    Recent Labs: 03/03/2017: ALT 28; BUN 18; Creatinine, Ser 1.22; Potassium 4.3; Sodium 140    Lipid Panel Lab Results  Component Value Date   CHOL 109 03/03/2017   HDL 39.80 03/03/2017   LDLCALC 54 03/03/2017   TRIG 75.0 03/03/2017      Wt Readings from Last 3 Encounters:  11/23/17 219 lb 8 oz (99.6 kg)  10/18/17 217 lb 12 oz (98.8 kg)  03/23/17 220 lb (99.8 kg)       ASSESSMENT AND PLAN:  Centrilobular emphysema ([place]) Long smoking history, stopped last year CT scans with minimal nodules Does not feel that he needs inhalers at this time  Coronary artery disease involving native heart without angina pectoris, unspecified vessel or lesion type - Plan: EKG  12-Lead There was mention of coronary calcification on left are from oncology concerning his CT scans No mention of coronary calcification or aortic atherosclerosis in the reports of the CTs We have pulled up images and reviewed these in detail today and again no aortic atherosclerosis of carotid calcification or no significan tcoronary calcification LDL in the 40s No further workup needed  Diabetes mellitus without complication ([place]) - Plan: EKG 12-Lead We have encouraged continued exercise, careful diet management in an effort to lose weight. Hemoglobin A1c 6.5  Gastroesophageal reflux disease, esophagitis presence not specified Reports stable disease  History of hypertension Currently not on medications Recommended weight loss  NICOTINE ADDICTION Stop smoking last year, now using other nicotine supplements   Disposition:   F/U as needed   Total encounter time more than 60 minutes  Greater than 50% was spent in counseling and coordination of care with the patient    Orders  Placed This Encounter  Procedures  . EKG 12-Lead     Signed, Esmond Plants, M.D., Ph.D. 11/23/2017  [place], [place]

## 2017-11-23 ENCOUNTER — Encounter: Payer: Self-pay | Admitting: Cardiovascular Disease

## 2017-11-23 ENCOUNTER — Ambulatory Visit (INDEPENDENT_AMBULATORY_CARE_PROVIDER_SITE_OTHER): Payer: Medicare Other | Admitting: Cardiovascular Disease

## 2017-11-23 VITALS — BP 136/70 | HR 67 | Ht 71.0 in | Wt 219.5 lb

## 2017-11-23 DIAGNOSIS — J432 Centrilobular emphysema: Secondary | ICD-10-CM | POA: Diagnosis not present

## 2017-11-23 DIAGNOSIS — K219 Gastro-esophageal reflux disease without esophagitis: Secondary | ICD-10-CM

## 2017-11-23 DIAGNOSIS — I2510 Atherosclerotic heart disease of native coronary artery without angina pectoris: Secondary | ICD-10-CM | POA: Diagnosis not present

## 2017-11-23 DIAGNOSIS — E119 Type 2 diabetes mellitus without complications: Secondary | ICD-10-CM

## 2017-11-23 DIAGNOSIS — Z8679 Personal history of other diseases of the circulatory system: Secondary | ICD-10-CM

## 2017-11-23 DIAGNOSIS — F17200 Nicotine dependence, unspecified, uncomplicated: Secondary | ICD-10-CM

## 2017-11-23 DIAGNOSIS — R600 Localized edema: Secondary | ICD-10-CM | POA: Diagnosis not present

## 2017-11-23 NOTE — Patient Instructions (Signed)

## 2018-02-07 DIAGNOSIS — X32XXXA Exposure to sunlight, initial encounter: Secondary | ICD-10-CM | POA: Diagnosis not present

## 2018-02-07 DIAGNOSIS — C44319 Basal cell carcinoma of skin of other parts of face: Secondary | ICD-10-CM | POA: Diagnosis not present

## 2018-02-07 DIAGNOSIS — Z08 Encounter for follow-up examination after completed treatment for malignant neoplasm: Secondary | ICD-10-CM | POA: Diagnosis not present

## 2018-02-07 DIAGNOSIS — D485 Neoplasm of uncertain behavior of skin: Secondary | ICD-10-CM | POA: Diagnosis not present

## 2018-02-07 DIAGNOSIS — C44619 Basal cell carcinoma of skin of left upper limb, including shoulder: Secondary | ICD-10-CM | POA: Diagnosis not present

## 2018-02-07 DIAGNOSIS — L570 Actinic keratosis: Secondary | ICD-10-CM | POA: Diagnosis not present

## 2018-02-07 DIAGNOSIS — Z85828 Personal history of other malignant neoplasm of skin: Secondary | ICD-10-CM | POA: Diagnosis not present

## 2018-02-09 ENCOUNTER — Ambulatory Visit (INDEPENDENT_AMBULATORY_CARE_PROVIDER_SITE_OTHER): Payer: Medicare Other

## 2018-02-09 DIAGNOSIS — Z23 Encounter for immunization: Secondary | ICD-10-CM | POA: Diagnosis not present

## 2018-02-15 DIAGNOSIS — E119 Type 2 diabetes mellitus without complications: Secondary | ICD-10-CM | POA: Diagnosis not present

## 2018-02-15 LAB — HM DIABETES EYE EXAM

## 2018-02-16 ENCOUNTER — Encounter: Payer: Self-pay | Admitting: Family Medicine

## 2018-02-27 ENCOUNTER — Other Ambulatory Visit: Payer: Self-pay | Admitting: Family Medicine

## 2018-02-27 DIAGNOSIS — E119 Type 2 diabetes mellitus without complications: Secondary | ICD-10-CM

## 2018-03-03 ENCOUNTER — Other Ambulatory Visit (INDEPENDENT_AMBULATORY_CARE_PROVIDER_SITE_OTHER): Payer: Medicare Other

## 2018-03-03 DIAGNOSIS — E119 Type 2 diabetes mellitus without complications: Secondary | ICD-10-CM | POA: Diagnosis not present

## 2018-03-03 LAB — LIPID PANEL
Cholesterol: 125 mg/dL (ref 0–200)
HDL: 40.9 mg/dL (ref >39.00)
LDL Cholesterol: 67 mg/dL (ref 0–99)
NonHDL: 83.92
Total CHOL/HDL Ratio: 3
Triglycerides: 83 mg/dL (ref 0.0–149.0)
VLDL: 16.6 mg/dL (ref 0.0–40.0)

## 2018-03-03 LAB — COMPREHENSIVE METABOLIC PANEL
ALK PHOS: 32 U/L — AB (ref 39–117)
ALT: 25 U/L (ref 0–53)
AST: 17 U/L (ref 0–37)
Albumin: 4.1 g/dL (ref 3.5–5.2)
BUN: 22 mg/dL (ref 6–23)
CO2: 29 mEq/L (ref 19–32)
Calcium: 9.2 mg/dL (ref 8.4–10.5)
Chloride: 104 mEq/L (ref 96–112)
Creatinine, Ser: 1.31 mg/dL (ref 0.40–1.50)
GFR: 57.69 mL/min — ABNORMAL LOW (ref >60.00)
Glucose, Bld: 137 mg/dL — ABNORMAL HIGH (ref 70–99)
POTASSIUM: 4.4 meq/L (ref 3.5–5.1)
SODIUM: 141 meq/L (ref 135–145)
TOTAL PROTEIN: 6.8 g/dL (ref 6.0–8.3)
Total Bilirubin: 0.7 mg/dL (ref 0.2–1.2)

## 2018-03-03 LAB — MICROALBUMIN / CREATININE URINE RATIO
Creatinine,U: 240.8 mg/dL
MICROALB/CREAT RATIO: 0.3 mg/g (ref 0.0–30.0)

## 2018-03-03 LAB — HEMOGLOBIN A1C: HEMOGLOBIN A1C: 6.7 % — AB (ref 4.6–6.5)

## 2018-03-10 ENCOUNTER — Encounter: Payer: Self-pay | Admitting: Family Medicine

## 2018-03-10 ENCOUNTER — Ambulatory Visit (INDEPENDENT_AMBULATORY_CARE_PROVIDER_SITE_OTHER): Payer: Medicare Other

## 2018-03-10 ENCOUNTER — Ambulatory Visit (INDEPENDENT_AMBULATORY_CARE_PROVIDER_SITE_OTHER): Payer: Medicare Other | Admitting: Family Medicine

## 2018-03-10 VITALS — BP 114/70 | HR 74 | Temp 97.9°F | Ht 72.25 in | Wt 221.5 lb

## 2018-03-10 DIAGNOSIS — E119 Type 2 diabetes mellitus without complications: Secondary | ICD-10-CM | POA: Diagnosis not present

## 2018-03-10 DIAGNOSIS — Z7189 Other specified counseling: Secondary | ICD-10-CM

## 2018-03-10 DIAGNOSIS — Z0000 Encounter for general adult medical examination without abnormal findings: Secondary | ICD-10-CM | POA: Diagnosis not present

## 2018-03-10 DIAGNOSIS — K2270 Barrett's esophagus without dysplasia: Secondary | ICD-10-CM

## 2018-03-10 DIAGNOSIS — I2510 Atherosclerotic heart disease of native coronary artery without angina pectoris: Secondary | ICD-10-CM

## 2018-03-10 DIAGNOSIS — M549 Dorsalgia, unspecified: Secondary | ICD-10-CM | POA: Diagnosis not present

## 2018-03-10 MED ORDER — PANTOPRAZOLE SODIUM 40 MG PO TBEC: DELAYED_RELEASE_TABLET | ORAL | 3 refills | Status: DC

## 2018-03-10 NOTE — Patient Instructions (Addendum)
Brian Hull , Thank you for taking time to come for your Medicare Wellness Visit. I appreciate your ongoing commitment to your health goals. Please review the following plan we discussed and let me know if I can assist you in the future.   These are the goals we discussed: Goals    . Patient Stated     Starting 03/10/2018, I will continue to walk at least 45 minutes daily.        This is a list of the screening recommended for you and due dates:  Health Maintenance  Topic Date Due  . Tetanus Vaccine  05/10/2019*  . Hemoglobin A1C  09/02/2018  . Eye exam for diabetics  02/16/2019  . Urine Protein Check  03/04/2019  . Complete foot exam   03/11/2019  . Colon Cancer Screening  03/01/2022  . Flu Shot  Completed  .  Hepatitis C: One time screening is recommended by Center for Disease Control  (CDC) for  adults born from [birth year] through 1967.   Completed  . Pneumonia vaccines  Completed  *Topic was postponed. The date shown is not the original due date.   Preventive Care for Adults  A healthy lifestyle and preventive care can promote health and wellness. Preventive health guidelines for adults include the following key practices.  . A routine yearly physical is a good way to check with your health care provider about your health and preventive screening. It is a chance to share any concerns and updates on your health and to receive a thorough exam.  . Visit your dentist for a routine exam and preventive care every 6 months. Brush your teeth twice a day and floss once a day. Good oral hygiene prevents tooth decay and gum disease.  . The frequency of eye exams is based on your age, health, family medical history, use  of contact lenses, and other factors. Follow your health care provider's recommendations for frequency of eye exams.  . Eat a healthy diet. Foods like vegetables, fruits, whole grains, low-fat dairy products, and lean protein foods contain the nutrients you need without too many  calories. Decrease your intake of foods high in solid fats, added sugars, and salt. Eat the right amount of calories for you. Get information about a proper diet from your health care provider, if necessary.  . Regular physical exercise is one of the most important things you can do for your health. Most adults should get at least 150 minutes of moderate-intensity exercise (any activity that increases your heart rate and causes you to sweat) each week. In addition, most adults need muscle-strengthening exercises on 2 or more days a week.  Silver Sneakers may be a benefit available to you. To determine eligibility, you may visit the website: www.silversneakers.com or contact program at (504) 877-0416 Mon-Fri between 8AM-8PM.   . Maintain a healthy weight. The body mass index (BMI) is a screening tool to identify possible weight problems. It provides an estimate of body fat based on height and weight. Your health care provider can find your BMI and can help you achieve or maintain a healthy weight.   For adults 20 years and older: ? A BMI below 18.5 is considered underweight. ? A BMI of 18.5 to 24.9 is normal. ? A BMI of 25 to 29.9 is considered overweight. ? A BMI of 30 and above is considered obese.   . Maintain normal blood lipids and cholesterol levels by exercising and minimizing your intake of saturated fat. Eat a  balanced diet with plenty of fruit and vegetables. Blood tests for lipids and cholesterol should begin at age 44 and be repeated every 5 years. If your lipid or cholesterol levels are high, you are over 50, or you are at high risk for heart disease, you may need your cholesterol levels checked more frequently. Ongoing high lipid and cholesterol levels should be treated with medicines if diet and exercise are not working.  . If you smoke, find out from your health care provider how to quit. If you do not use tobacco, please do not start.  . If you choose to drink alcohol, please do  not consume more than 2 drinks per day. One drink is considered to be 12 ounces (355 mL) of beer, 5 ounces (148 mL) of wine, or 1.5 ounces (44 mL) of liquor.  . If you are 29-15 years old, ask your health care provider if you should take aspirin to prevent strokes.  . Use sunscreen. Apply sunscreen liberally and repeatedly throughout the day. You should seek shade when your shadow is shorter than you. Protect yourself by wearing long sleeves, pants, a wide-brimmed hat, and sunglasses year round, whenever you are outdoors.  . Once a month, do a whole body skin exam, using a mirror to look at the skin on your back. Tell your health care provider of new moles, moles that have irregular borders, moles that are larger than a pencil eraser, or moles that have changed in shape or color.

## 2018-03-10 NOTE — Patient Instructions (Addendum)
Tetanus shot may be cheaper at the pharmacy or health department.   Check with your insurance to see if they will cover the shingrix shot. Recheck in about 6 months.  The only lab you need to have done for your next diabetic visit is an A1c.  We can do this with a fingerstick test at the office visit.  You do not need a lab visit ahead of time for this.  It does not matter if you are fasting when the lab is done.   Take care.  Glad to see you.

## 2018-03-10 NOTE — Progress Notes (Signed)
Barretts esophagus, history of.  EGD and colonoscopy done by GI 03/01/17.  He'll have 5 year f/u per GI.  Still on PPI.  Compliant.  No adverse effect on medication.  Type 2 diabetes.  A1c prev 6.5. 6.7 now.  We talked about options and labs.  No foot troubles other than occ superficial plantar blistering. No other lesions o/w.  Labs dw pt.   Hearing aids declined.   He quit E cig.  Still dipping, d/w pt.   He has CT scanning of lungs ongoing, yearly.   Shingles and tetanus d/w pt.  See avs.  PNA and flu up to date.   Prostate cancer screening and PSA options (with potential risks and benefits of testing vs not testing) were discussed along with recent recs/guidelines.  He declined testing PSA at this point. Advance directive- wife designated if patient were incapacitated.  He still has to be careful about his back.  He intentionally limits his activity.  D/w pt.  He'll update me as needed.    PMH and SH reviewed  Meds, vitals, and allergies reviewed.   ROS: Per HPI unless specifically indicated in ROS section   GEN: nad, alert and oriented HEENT: mucous membranes moist NECK: supple w/o LA CV: rrr. PULM: ctab, no inc wob ABD: soft, +bs EXT: trace BLE edema SKIN: no acute rash  Diabetic foot exam: Normal inspection No skin breakdown No calluses  Normal DP pulses Normal sensation to light touch and monofilament Nails normal

## 2018-03-10 NOTE — Progress Notes (Signed)
PCP notes:   Health maintenance:  Foot exam - PCP follow-up requested  Abnormal screenings:   Hearing - failed  Hearing Screening   125Hz  250Hz  500Hz  1000Hz  2000Hz  3000Hz  4000Hz  6000Hz  8000Hz   Right ear:   40 40 40  40    Left ear:   40 40 40  0     Patient concerns:   None  Nurse concerns:  None  Next PCP appt:   03/10/2018 @ 1515  I reviewed health advisor's note, was available for consultation on the day of service listed in this note, and agree with documentation and plan. Elsie Stain, MD.

## 2018-03-10 NOTE — Progress Notes (Signed)
Subjective:   Brian Hull. is a 67 y.o. male who presents for Medicare Annual/Subsequent preventive examination.  Review of Systems:  N/A Cardiac Risk Factors include: advanced age (>109men, >69 women);male gender;diabetes mellitus;smoking/ tobacco exposure     Objective:    Vitals: BP 114/70 (BP Location: Right Arm, Patient Position: Sitting, Cuff Size: Normal)   Pulse 74   Temp 97.9 F (36.6 C) (Oral)   Ht 6' 0.25" (1.835 m) Comment: shoes  Wt 221 lb 8 oz (100.5 kg)   SpO2 98%   BMI 29.83 kg/m   Body mass index is 29.83 kg/m.  Advanced Directives 03/10/2018  Does Patient Have a Medical Advance Directive? No  Would patient like information on creating a medical advance directive? Yes (MAU/Ambulatory/Procedural Areas - Information given)    Tobacco Social History   Tobacco Use  Smoking Status Former Smoker  . Packs/day: 1.00  . Years: 55.00  . Pack years: 55.00  . Last attempt to quit: 04/10/2017  . Years since quitting: 0.9  Smokeless Tobacco Current User  . Types: Snuff  Tobacco Comment   using Ecig 2015     Ready to quit: No Counseling given: No Comment: using Ecig 2015   Clinical Intake:  Pre-visit preparation completed: Yes  Pain : No/denies pain Pain Score: 0-No pain     Nutritional Status: BMI 25 -29 Overweight Nutritional Risks: None Diabetes: No  How often do you need to have someone help you when you read instructions, pamphlets, or other written materials from your doctor or pharmacy?: 1 - Never What is the last grade level you completed in school?: 12th grade + 2 yrs college  Interpreter Needed?: No  Comments: pt lives with spouse Information entered by :: LPinson, LPN  Past Medical History:  Diagnosis Date  . Amputation, thumb, traumatic 1968   tip amputated   . Barrett's esophagus 05/08/02, 02/15/05, 2009   EGD- Barrett's esoph, HH, gastritis, diverticulitis  . Basal cell cancer 05/2000   of nose, face (seen at [facility] 2015)  .  Diabetes mellitus without complication ([place])   . Esophageal reflux   . History of colon polyps   . Hypertension   . Lipoma of unspecified site   . Pure hypercholesterolemia   . Tobacco use disorder   . Unspecified gastritis and gastroduodenitis without mention of hemorrhage   . Wrist fracture, left    as a child   Past Surgical History:  Procedure Laterality Date  . CATARACT EXTRACTION     2012  . CHOLECYSTECTOMY, LAPAROSCOPIC  12/20/06   [facility]  . EYE SURGERY     trauma to right eye as a child, growth removed from eye 2 years later  . LAMINECTOMY  1984   L 4/5  . UPPER GI ENDOSCOPY  9/205   Family History  Problem Relation Age of Onset  . Diabetes Mother   . Heart disease Mother        MI  . Alzheimer's disease Mother        progressing  . Cancer Father        possible colon  . Diabetes Father   . Heart disease Father        CHF  . Stroke Father   . Hypertension Father   . Colon cancer Father        possible dx  . Hypertension Sister   . Colon polyps Sister   . Cancer Sister        breast cancer  .  Cancer Brother        lung- smoker  . Diabetes Brother   . Colon polyps Brother   . Hypertension Sister   . Colon polyps Sister   . Depression Neg Hx   . Alcohol abuse Neg Hx   . Drug abuse Neg Hx   . Prostate cancer Neg Hx    Social History   Socioeconomic History  . Marital status: Married    Spouse name: Not on file  . Number of children: 3  . Years of education: Not on file  . Highest education level: Not on file  Occupational History  . Occupation: retired IT trainer, on disability for back pain  Social Needs  . Financial resource strain: Not on file  . Food insecurity:    Worry: Not on file    Inability: Not on file  . Transportation needs:    Medical: Not on file    Non-medical: Not on file  Tobacco Use  . Smoking status: Former Smoker    Packs/day: 1.00    Years: 55.00    Pack years: 55.00    Last attempt to quit: 04/10/2017    Years  since quitting: 0.9  . Smokeless tobacco: Current User    Types: Snuff  . Tobacco comment: using Ecig 2015  Substance and Sexual Activity  . Alcohol use: No    Alcohol/week: 0.0 standard drinks  . Drug use: No  . Sexual activity: Not on file  Lifestyle  . Physical activity:    Days per week: Not on file    Minutes per session: Not on file  . Stress: Not on file  Relationships  . Social connections:    Talks on phone: Not on file    Gets together: Not on file    Attends religious service: Not on file    Active member of club or organization: Not on file    Attends meetings of clubs or organizations: Not on file    Relationship status: Not on file  Other Topics Concern  . Not on file  Social History Narrative   3 children initially- 2 sons locally, one daughter died at age 61 from tetrology of fallot.   [employer] [phone], domestic   Married 1970    Outpatient Encounter Medications as of 03/10/2018  Medication Sig  . pantoprazole (PROTONIX) 40 MG tablet TAKE (1) TABLET BY MOUTH EVERY DAY   No facility-administered encounter medications on file as of 03/10/2018.     Activities of Daily Living In your present state of health, do you have any difficulty performing the following activities: 03/10/2018  Hearing? Y  Vision? N  Difficulty concentrating or making decisions? N  Walking or climbing stairs? N  Dressing or bathing? N  Doing errands, shopping? N  Preparing Food and eating ? N  Using the Toilet? N  In the past six months, have you accidently leaked urine? N  Do you have problems with loss of bowel control? N  Managing your Medications? N  Managing your Finances? N  Housekeeping or managing your Housekeeping? N  Some recent data might be hidden    Patient Care Team: Tonia Ghent, MD as PCP - General   Assessment:   This is a routine wellness examination for [place].   Hearing Screening   125Hz  250Hz  500Hz  1000Hz  2000Hz  3000Hz  4000Hz  6000Hz  8000Hz   Right ear:    40 40 40  40    Left ear:   40 40 40  0  Vision Screening Comments: Vision exam in Oct 2019 with Dr. Tempie Donning   Exercise Activities and Dietary recommendations Current Exercise Habits: Home exercise routine, Type of exercise: walking, Time (Minutes): 45, Frequency (Times/Week): 7, Weekly Exercise (Minutes/Week): 315, Intensity: Mild, Exercise limited by: None identified  Goals    . Patient Stated     Starting 03/10/2018, I will continue to walk at least 45 minutes daily.        Fall Risk Fall Risk  03/10/2018 03/08/2017 03/04/2016 03/03/2015  Falls in the past year? 0 No No No   Depression Screen PHQ 2/9 Scores 03/10/2018 03/08/2017 03/04/2016 03/03/2015  PHQ - 2 Score 0 0 0 0  PHQ- 9 Score 0 - - -    Cognitive Function MMSE - Mini Mental State Exam 03/10/2018  Orientation to time 5  Orientation to Place 5  Registration 3  Attention/ Calculation 0  Recall 3  Language- name 2 objects 0  Language- repeat 1  Language- follow 3 step command 3  Language- read & follow direction 0  Write a sentence 0  Copy design 0  Total score 20     PLEASE NOTE: A Mini-Cog screen was completed. Maximum score is 20. A value of 0 denotes this part of [place] MMSE was not completed or the patient failed this part of the Mini-Cog screening.   Mini-Cog Screening Orientation to Time - Max 5 pts Orientation to Place - Max 5 pts Registration - Max 3 pts Recall - Max 3 pts Language Repeat - Max 1 pts Language Follow 3 Step Command - Max 3 pts     Immunization History  Administered Date(s) Administered  . Influenza Split 02/14/2012, 02/15/2014  . Influenza,inj,Quad PF,6+ Mos 02/27/2013, 03/03/2015, 03/04/2016, 03/08/2017, 02/09/2018  . Pneumococcal Conjugate-13 03/03/2015  . Pneumococcal Polysaccharide-23 03/04/2016  . Td 05/11/1996, 01/25/2007  . Zoster 05/21/2011    Screening Tests Health Maintenance  Topic Date Due  . TETANUS/TDAP  05/10/2019 (Originally 01/24/2017)  . HEMOGLOBIN  A1C  09/02/2018  . OPHTHALMOLOGY EXAM  02/16/2019  . URINE MICROALBUMIN  03/04/2019  . FOOT EXAM  03/11/2019  . COLONOSCOPY  03/01/2022  . INFLUENZA VACCINE  Completed  . Hepatitis C Screening  Completed  . PNA vac Low Risk Adult  Completed       Plan:     I have personally reviewed, addressed, and noted the following in the patient's chart:  A. Medical and social history B. Use of alcohol, tobacco or illicit drugs  C. Current medications and supplements D. Functional ability and status E.  Nutritional status F.  Physical activity G. Advance directives H. List of other physicians I.  Hospitalizations, surgeries, and ER visits in previous 12 months J.  [place] to include hearing, vision, cognitive, depression L. Referrals and appointments - none  In addition, I have reviewed and discussed with patient certain preventive protocols, quality metrics, and best practice recommendations. A written personalized care plan for preventive services as well as general preventive health recommendations were provided to patient.  See attached scanned questionnaire for additional information.   Signed,   Lindell Noe, MHA, BS, LPN Health Coach

## 2018-03-12 DIAGNOSIS — M549 Dorsalgia, unspecified: Secondary | ICD-10-CM | POA: Insufficient documentation

## 2018-03-12 DIAGNOSIS — Z0000 Encounter for general adult medical examination without abnormal findings: Secondary | ICD-10-CM | POA: Insufficient documentation

## 2018-03-12 NOTE — Assessment & Plan Note (Addendum)
He still has to be really careful about his back.  He limits activity when he starts to feel a strain in his lower back.  He is not enthused about extra intervention at this point.  If he has worsening symptoms and if he wants to go talk to a spine clinic we can refer him later on.  We did not do this today.  He will update me as needed.  He agrees with plan.  >25 minutes spent in face to face time with patient, >50% spent in counselling or coordination of care.

## 2018-03-12 NOTE — Assessment & Plan Note (Signed)
History of.  Continue PPI.  No adverse effect on medication.  He will have routine GI follow-up.

## 2018-03-12 NOTE — Assessment & Plan Note (Signed)
Hearing aids declined.   He quit E cig.  Still dipping, d/w pt.   He has CT scanning of lungs ongoing, yearly.   Shingles and tetanus d/w pt.  See avs.  PNA and flu up to date.   Prostate cancer screening and PSA options (with potential risks and benefits of testing vs not testing) were discussed along with recent recs/guidelines.  He declined testing PSA at this point. Advance directive- wife designated if patient were incapacitated.

## 2018-03-12 NOTE — Assessment & Plan Note (Signed)
Advance directive- wife designated if patient were incapacitated.  

## 2018-03-12 NOTE — Assessment & Plan Note (Signed)
A1c prev 6.5. 6.7 now.  We talked about options and labs.  No foot troubles other than occ superficial plantar blistering. No other lesions o/w.  Labs dw pt. recheck periodically.  Update me as needed.  Continue work on diet and exercise.

## 2018-03-28 DIAGNOSIS — C44319 Basal cell carcinoma of skin of other parts of face: Secondary | ICD-10-CM | POA: Diagnosis not present

## 2018-04-04 DIAGNOSIS — C44619 Basal cell carcinoma of skin of left upper limb, including shoulder: Secondary | ICD-10-CM | POA: Diagnosis not present

## 2018-07-11 DIAGNOSIS — Z85828 Personal history of other malignant neoplasm of skin: Secondary | ICD-10-CM | POA: Diagnosis not present

## 2018-07-11 DIAGNOSIS — D2261 Melanocytic nevi of right upper limb, including shoulder: Secondary | ICD-10-CM | POA: Diagnosis not present

## 2018-07-11 DIAGNOSIS — D485 Neoplasm of uncertain behavior of skin: Secondary | ICD-10-CM | POA: Diagnosis not present

## 2018-07-11 DIAGNOSIS — C44219 Basal cell carcinoma of skin of left ear and external auricular canal: Secondary | ICD-10-CM | POA: Diagnosis not present

## 2018-07-11 DIAGNOSIS — L570 Actinic keratosis: Secondary | ICD-10-CM | POA: Diagnosis not present

## 2018-07-11 DIAGNOSIS — D2272 Melanocytic nevi of left lower limb, including hip: Secondary | ICD-10-CM | POA: Diagnosis not present

## 2018-07-11 DIAGNOSIS — Z08 Encounter for follow-up examination after completed treatment for malignant neoplasm: Secondary | ICD-10-CM | POA: Diagnosis not present

## 2018-07-11 DIAGNOSIS — D2271 Melanocytic nevi of right lower limb, including hip: Secondary | ICD-10-CM | POA: Diagnosis not present

## 2018-07-11 DIAGNOSIS — D225 Melanocytic nevi of trunk: Secondary | ICD-10-CM | POA: Diagnosis not present

## 2018-07-11 DIAGNOSIS — L821 Other seborrheic keratosis: Secondary | ICD-10-CM | POA: Diagnosis not present

## 2018-07-11 DIAGNOSIS — D2262 Melanocytic nevi of left upper limb, including shoulder: Secondary | ICD-10-CM | POA: Diagnosis not present

## 2018-08-14 ENCOUNTER — Encounter: Payer: Self-pay | Admitting: *Deleted

## 2018-09-07 ENCOUNTER — Other Ambulatory Visit: Payer: Self-pay | Admitting: Family Medicine

## 2018-09-07 DIAGNOSIS — E119 Type 2 diabetes mellitus without complications: Secondary | ICD-10-CM

## 2018-09-13 ENCOUNTER — Other Ambulatory Visit (INDEPENDENT_AMBULATORY_CARE_PROVIDER_SITE_OTHER): Payer: Medicare Other

## 2018-09-13 ENCOUNTER — Other Ambulatory Visit: Payer: Self-pay

## 2018-09-13 DIAGNOSIS — E119 Type 2 diabetes mellitus without complications: Secondary | ICD-10-CM

## 2018-09-13 LAB — POCT GLYCOSYLATED HEMOGLOBIN (HGB A1C): Hemoglobin A1C: 6.7 % — AB (ref 4.0–5.6)

## 2018-09-15 ENCOUNTER — Ambulatory Visit (INDEPENDENT_AMBULATORY_CARE_PROVIDER_SITE_OTHER): Payer: Medicare Other | Admitting: Family Medicine

## 2018-09-15 ENCOUNTER — Encounter: Payer: Self-pay | Admitting: Family Medicine

## 2018-09-15 ENCOUNTER — Ambulatory Visit: Payer: Medicare Other | Admitting: Family Medicine

## 2018-09-15 DIAGNOSIS — H539 Unspecified visual disturbance: Secondary | ICD-10-CM

## 2018-09-15 DIAGNOSIS — E119 Type 2 diabetes mellitus without complications: Secondary | ICD-10-CM | POA: Diagnosis not present

## 2018-09-15 NOTE — Progress Notes (Signed)
Virtual visit completed through WebEx or similar program Patient location: home  Provider location: [facility], office   Limitations and rationale for visit method d/w patient.  Patient agreed to proceed.   CC: DM  HPI: Diabetes:  No meds.  Hypoglycemic episodes: no Hyperglycemic episodes: no Feet problems: no tingling.  Blood Sugars averaging: not checked recently.   eye exam within last year: yes  Pandemic consideration d/w pt.  He is managing.    He had episode last week with sparkling in the periphery of the L eye. No loss of vision.  No R eye sx.  No HA with the event.  He has occ HA.  No h/o migraines.  Episodes lasted about 30 minutes.  He still had sparkling even with eye closing.  Single event.  No other neuro sx, no weakness, no lip droop, etc.  I asked him to call the eye clinic about this, he agreed.  His vision is normal now.    Meds and allergies reviewed.   ROS: Per HPI unless specifically indicated in ROS section   NAD Speech wnl  A/P:  DM2.  No change in meds, plan on recheck about 6 months.  Labs discussed with patient.  He agrees.  Continue work on diet and exercise.  Vision changes.  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  No emergent or neurologic symptoms otherwise.  No symptoms now.  I asked him to follow-up with the eye clinic and he said he would.

## 2018-09-17 DIAGNOSIS — H539 Unspecified visual disturbance: Secondary | ICD-10-CM | POA: Insufficient documentation

## 2018-09-17 NOTE — Assessment & Plan Note (Signed)
  Vision changes.  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  No emergent or neurologic symptoms otherwise.  No symptoms now.  I asked him to follow-up with the eye clinic and he said he would.  His vision is back to normal now.

## 2018-09-17 NOTE — Assessment & Plan Note (Signed)
No change in meds, plan on recheck about 6 months.  Labs discussed with patient.  He agrees.  Continue work on diet and exercise.

## 2018-09-20 DIAGNOSIS — C44311 Basal cell carcinoma of skin of nose: Secondary | ICD-10-CM | POA: Diagnosis not present

## 2018-10-20 ENCOUNTER — Telehealth: Payer: Self-pay | Admitting: *Deleted

## 2018-10-20 DIAGNOSIS — Z122 Encounter for screening for malignant neoplasm of respiratory organs: Secondary | ICD-10-CM

## 2018-10-20 DIAGNOSIS — Z87891 Personal history of nicotine dependence: Secondary | ICD-10-CM

## 2018-10-20 NOTE — Telephone Encounter (Signed)
Patient has been notified that annual lung cancer screening low dose CT scan is due currently or will be in near future. Confirmed that patient is within the age range of 55-77, and asymptomatic, (no signs or symptoms of lung cancer). Patient denies illness that would prevent curative treatment for lung cancer if found. Verified smoking history, (former, quit 04/10/17, 55 pack year). The shared decision making visit was done 03/23/17. Patient is agreeable for CT scan being scheduled.  °

## 2018-10-24 ENCOUNTER — Other Ambulatory Visit: Payer: Self-pay

## 2018-10-24 ENCOUNTER — Ambulatory Visit: Admission: RE | Admit: 2018-10-24 | Payer: Medicare Other | Source: Ambulatory Visit

## 2018-10-24 ENCOUNTER — Ambulatory Visit
Admission: RE | Admit: 2018-10-24 | Discharge: 2018-10-24 | Disposition: A | Discharge status: Home / Self Care | Payer: Medicare Other | Source: Ambulatory Visit | Attending: Oncology | Admitting: Oncology

## 2018-10-24 DIAGNOSIS — Z122 Encounter for screening for malignant neoplasm of respiratory organs: Secondary | ICD-10-CM

## 2018-10-24 DIAGNOSIS — Z87891 Personal history of nicotine dependence: Secondary | ICD-10-CM | POA: Diagnosis not present

## 2018-10-26 ENCOUNTER — Encounter: Payer: Self-pay | Admitting: *Deleted

## 2019-01-09 ENCOUNTER — Ambulatory Visit (INDEPENDENT_AMBULATORY_CARE_PROVIDER_SITE_OTHER): Payer: Medicare Other

## 2019-01-09 DIAGNOSIS — Z23 Encounter for immunization: Secondary | ICD-10-CM | POA: Diagnosis not present

## 2019-02-19 DIAGNOSIS — E119 Type 2 diabetes mellitus without complications: Secondary | ICD-10-CM | POA: Diagnosis not present

## 2019-02-19 LAB — HM DIABETES EYE EXAM

## 2019-02-20 DIAGNOSIS — D485 Neoplasm of uncertain behavior of skin: Secondary | ICD-10-CM | POA: Diagnosis not present

## 2019-02-20 DIAGNOSIS — Z85828 Personal history of other malignant neoplasm of skin: Secondary | ICD-10-CM | POA: Diagnosis not present

## 2019-02-20 DIAGNOSIS — C44319 Basal cell carcinoma of skin of other parts of face: Secondary | ICD-10-CM | POA: Diagnosis not present

## 2019-02-20 DIAGNOSIS — D2262 Melanocytic nevi of left upper limb, including shoulder: Secondary | ICD-10-CM | POA: Diagnosis not present

## 2019-02-20 DIAGNOSIS — C44619 Basal cell carcinoma of skin of left upper limb, including shoulder: Secondary | ICD-10-CM | POA: Diagnosis not present

## 2019-02-20 DIAGNOSIS — L570 Actinic keratosis: Secondary | ICD-10-CM | POA: Diagnosis not present

## 2019-02-20 DIAGNOSIS — D225 Melanocytic nevi of trunk: Secondary | ICD-10-CM | POA: Diagnosis not present

## 2019-02-20 DIAGNOSIS — D2271 Melanocytic nevi of right lower limb, including hip: Secondary | ICD-10-CM | POA: Diagnosis not present

## 2019-02-25 ENCOUNTER — Other Ambulatory Visit: Payer: Self-pay | Admitting: Family Medicine

## 2019-03-06 ENCOUNTER — Other Ambulatory Visit: Payer: Self-pay | Admitting: Family Medicine

## 2019-03-06 DIAGNOSIS — E119 Type 2 diabetes mellitus without complications: Secondary | ICD-10-CM

## 2019-03-12 ENCOUNTER — Other Ambulatory Visit (INDEPENDENT_AMBULATORY_CARE_PROVIDER_SITE_OTHER): Payer: Medicare Other

## 2019-03-12 ENCOUNTER — Other Ambulatory Visit: Payer: Self-pay

## 2019-03-12 DIAGNOSIS — E119 Type 2 diabetes mellitus without complications: Secondary | ICD-10-CM | POA: Diagnosis not present

## 2019-03-12 LAB — COMPREHENSIVE METABOLIC PANEL
ALT: 28 U/L (ref 0–53)
AST: 20 U/L (ref 0–37)
Albumin: 4.1 g/dL (ref 3.5–5.2)
Alkaline Phosphatase: 34 U/L — ABNORMAL LOW (ref 39–117)
BUN: 22 mg/dL (ref 6–23)
CO2: 27 mEq/L (ref 19–32)
Calcium: 8.9 mg/dL (ref 8.4–10.5)
Chloride: 106 mEq/L (ref 96–112)
Creatinine, Ser: 1.28 mg/dL (ref 0.40–1.50)
GFR: 55.58 mL/min — ABNORMAL LOW (ref >60.00)
Glucose, Bld: 157 mg/dL — ABNORMAL HIGH (ref 70–99)
Potassium: 4.1 mEq/L (ref 3.5–5.1)
Sodium: 140 mEq/L (ref 135–145)
Total Bilirubin: 0.5 mg/dL (ref 0.2–1.2)
Total Protein: 6.8 g/dL (ref 6.0–8.3)

## 2019-03-12 LAB — MICROALBUMIN / CREATININE URINE RATIO
Creatinine,U: 242.9 mg/dL
Microalb Creat Ratio: 0.4 mg/g (ref 0.0–30.0)
Microalb, Ur: 0.9 mg/dL (ref 0.0–1.9)

## 2019-03-12 LAB — HEMOGLOBIN A1C: Hgb A1c MFr Bld: 7 % — ABNORMAL HIGH (ref 4.6–6.5)

## 2019-03-12 LAB — LIPID PANEL
Cholesterol: 130 mg/dL (ref 0–200)
HDL: 40.2 mg/dL (ref >39.00)
LDL Cholesterol: 76 mg/dL (ref 0–99)
NonHDL: 90.2
Total CHOL/HDL Ratio: 3
Triglycerides: 71 mg/dL (ref 0.0–149.0)
VLDL: 14.2 mg/dL (ref 0.0–40.0)

## 2019-03-14 ENCOUNTER — Ambulatory Visit (INDEPENDENT_AMBULATORY_CARE_PROVIDER_SITE_OTHER): Payer: Medicare Other

## 2019-03-14 DIAGNOSIS — Z0000 Encounter for general adult medical examination without abnormal findings: Secondary | ICD-10-CM

## 2019-03-14 NOTE — Patient Instructions (Signed)
Brian Hull , Thank you for taking time to come for your Medicare Wellness Visit. I appreciate your ongoing commitment to your health goals. Please review the following plan we discussed and let me know if I can assist you in the future.   Screening recommendations/referrals: Colonoscopy: up to date, completed 03/01/2017 Recommended yearly ophthalmology/optometry visit for glaucoma screening and checkup Recommended yearly dental visit for hygiene and checkup  Vaccinations: Influenza vaccine: up to date, completed 01/09/2019 Pneumococcal vaccine: Completed series Tdap vaccine: declined Shingles vaccine: wants to get if covered by insurance    Advanced directives: Please bring a copy of your POA (Power of [place]) and/or Living Will to your next appointment.   Conditions/risks identified: diabetes  Next appointment: 03/15/2019 @ 10:45 am   Preventive Care 65 Years and Older, Male Preventive care refers to lifestyle choices and visits with your health care provider that can promote health and wellness. What does preventive care include?  A yearly physical exam. This is also called an annual well check.  Dental exams once or twice a year.  Routine eye exams. Ask your health care provider how often you should have your eyes checked.  Personal lifestyle choices, including:  Daily care of your teeth and gums.  Regular physical activity.  Eating a healthy diet.  Avoiding tobacco and drug use.  Limiting alcohol use.  Practicing safe sex.  Taking low doses of aspirin every day.  Taking vitamin and mineral supplements as recommended by your health care provider. What happens during an annual well check? The services and screenings done by your health care provider during your annual well check will depend on your age, overall health, lifestyle risk factors, and family history of disease. Counseling  Your health care provider may ask you questions about your:  Alcohol use.   Tobacco use.  Drug use.  Emotional well-being.  Home and relationship well-being.  Sexual activity.  Eating habits.  History of falls.  Memory and ability to understand (cognition).  Work and work Statistician. Screening  You may have the following tests or measurements:  Height, weight, and BMI.  Blood pressure.  Lipid and cholesterol levels. These may be checked every 5 years, or more frequently if you are over 70 years old.  Skin check.  Lung cancer screening. You may have this screening every year starting at age 70 if you have a 30-pack-year history of smoking and currently smoke or have quit within the past 15 years.  Fecal occult blood test (FOBT) of the stool. You may have this test every year starting at age 70.  Flexible sigmoidoscopy or colonoscopy. You may have a sigmoidoscopy every 5 years or a colonoscopy every 10 years starting at age 70.  Prostate cancer screening. Recommendations will vary depending on your family history and other risks.  Hepatitis C blood test.  Hepatitis B blood test.  Sexually transmitted disease (STD) testing.  Diabetes screening. This is done by checking your blood sugar (glucose) after you have not eaten for a while (fasting). You may have this done every 1-3 years.  Abdominal aortic aneurysm (AAA) screening. You may need this if you are a current or former smoker.  Osteoporosis. You may be screened starting at age 70 if you are at high risk. Talk with your health care provider about your test results, treatment options, and if necessary, the need for more tests. Vaccines  Your health care provider may recommend certain vaccines, such as:  Influenza vaccine. This is recommended every year.  Tetanus, diphtheria, and acellular pertussis (Tdap, Td) vaccine. You may need a Td booster every 10 years.  Zoster vaccine. You may need this after age 70.  Pneumococcal 13-valent conjugate (PCV13) vaccine. One dose is recommended  after age 70.  Pneumococcal polysaccharide (PPSV23) vaccine. One dose is recommended after age 70. Talk to your health care provider about which screenings and vaccines you need and how often you need them. This information is not intended to replace advice given to you by your health care provider. Make sure you discuss any questions you have with your health care provider. Document Released: 05/23/2015 Document Revised: 01/14/2016 Document Reviewed: 02/25/2015 Elsevier Interactive Patient Education  2017 [place] Prevention in the Home Falls can cause injuries. They can happen to people of all ages. There are many things you can do to make your home safe and to help prevent falls. What can I do on the outside of my home?  Regularly fix the edges of walkways and driveways and fix any cracks.  Remove anything that might make you trip as you walk through a door, such as a raised step or threshold.  Trim any bushes or trees on the path to your home.  Use bright outdoor lighting.  Clear any walking paths of anything that might make someone trip, such as rocks or tools.  Regularly check to see if handrails are loose or broken. Make sure that both sides of any steps have handrails.  Any raised decks and porches should have guardrails on the edges.  Have any leaves, snow, or ice cleared regularly.  Use sand or salt on walking paths during winter.  Clean up any spills in your garage right away. This includes oil or grease spills. What can I do in the bathroom?  Use night lights.  Install grab bars by the toilet and in the tub and shower. Do not use towel bars as grab bars.  Use non-skid mats or decals in the tub or shower.  If you need to sit down in the shower, use a plastic, non-slip stool.  Keep the floor dry. Clean up any water that spills on the floor as soon as it happens.  Remove soap buildup in the tub or shower regularly.  Attach bath mats securely with  double-sided non-slip rug tape.  Do not have throw rugs and other things on the floor that can make you trip. What can I do in the bedroom?  Use night lights.  Make sure that you have a light by your bed that is easy to reach.  Do not use any sheets or blankets that are too big for your bed. They should not hang down onto the floor.  Have a firm chair that has side arms. You can use this for support while you get dressed.  Do not have throw rugs and other things on the floor that can make you trip. What can I do in the kitchen?  Clean up any spills right away.  Avoid walking on wet floors.  Keep items that you use a lot in easy-to-reach places.  If you need to reach something above you, use a strong step stool that has a grab bar.  Keep electrical cords out of the way.  Do not use floor polish or wax that makes floors slippery. If you must use wax, use non-skid floor wax.  Do not have throw rugs and other things on the floor that can make you trip. What can I do  with my stairs?  Do not leave any items on the stairs.  Make sure that there are handrails on both sides of the stairs and use them. Fix handrails that are broken or loose. Make sure that handrails are as long as the stairways.  Check any carpeting to make sure that it is firmly attached to the stairs. Fix any carpet that is loose or worn.  Avoid having throw rugs at the top or bottom of the stairs. If you do have throw rugs, attach them to the floor with carpet tape.  Make sure that you have a light switch at the top of the stairs and the bottom of the stairs. If you do not have them, ask someone to add them for you. What else can I do to help prevent falls?  Wear shoes that:  Do not have high heels.  Have rubber bottoms.  Are comfortable and fit you well.  Are closed at the toe. Do not wear sandals.  If you use a stepladder:  Make sure that it is fully opened. Do not climb a closed stepladder.  Make  sure that both sides of the stepladder are locked into place.  Ask someone to hold it for you, if possible.  Clearly mark and make sure that you can see:  Any grab bars or handrails.  First and last steps.  Where the edge of each step is.  Use tools that help you move around (mobility aids) if they are needed. These include:  Canes.  Walkers.  Scooters.  Crutches.  Turn on the lights when you go into a dark area. Replace any light bulbs as soon as they burn out.  Set up your furniture so you have a clear path. Avoid moving your furniture around.  If any of your floors are uneven, fix them.  If there are any pets around you, be aware of where they are.  Review your medicines with your doctor. Some medicines can make you feel dizzy. This can increase your chance of falling. Ask your doctor what other things that you can do to help prevent falls. This information is not intended to replace advice given to you by your health care provider. Make sure you discuss any questions you have with your health care provider. Document Released: 02/20/2009 Document Revised: 10/02/2015 Document Reviewed: 05/31/2014 Elsevier Interactive Patient Education  2017 [employer].

## 2019-03-14 NOTE — Progress Notes (Signed)
PCP notes:  Health Maintenance: Wants to get Shingrix if covered by insurance   Abnormal Screenings: none   Patient concerns: none   Nurse concerns: none   Next PCP appt.: 03/15/2019 @ 10:45 am

## 2019-03-14 NOTE — Progress Notes (Signed)
Subjective:   Brian Hull. is a 68 y.o. male who presents for Medicare Annual/Subsequent preventive examination.  Review of Systems: N/A   This visit is being conducted through telemedicine via telephone at the nurse health advisor's home address due to the COVID-19 pandemic. This patient has given me verbal consent via doximity to conduct this visit, patient states they are participating from their home address. Patient and myself are on the telephone call. There is no referral for this visit. Some vital signs may be absent or patient reported.    Patient identification: identified by name, DOB, and current address   Cardiac Risk Factors include: advanced age (>46men, >70 women);diabetes mellitus;male gender     Objective:    Vitals: There were no vitals taken for this visit.  There is no height or weight on file to calculate BMI.  Advanced Directives 03/14/2019 03/10/2018  Does Patient Have a Medical Advance Directive? No No  Would patient like information on creating a medical advance directive? No - Patient declined Yes (MAU/Ambulatory/Procedural Areas - Information given)    Tobacco Social History   Tobacco Use  Smoking Status Former Smoker  . Packs/day: 1.00  . Years: 55.00  . Pack years: 55.00  . Quit date: 04/10/2017  . Years since quitting: 1.9  Smokeless Tobacco Current User  . Types: Snuff     Ready to quit: Not Answered Counseling given: Not Answered   Clinical Intake:  Pre-visit preparation completed: Yes  Pain : No/denies pain     Nutritional Risks: Nausea/ vomitting/ diarrhea(occasional diarrhea) Diabetes: Yes CBG done?: No Did pt. bring in CBG monitor from home?: No  How often do you need to have someone help you when you read instructions, pamphlets, or other written materials from your doctor or pharmacy?: 1 - Never What is the last grade level you completed in school?: some college  Interpreter Needed?: No  Information entered by ::  CJohnson, LPN  Past Medical History:  Diagnosis Date  . Amputation, thumb, traumatic 1968   tip amputated   . Barrett's esophagus 05/08/02, 02/15/05, 2009   EGD- Barrett's esoph, HH, gastritis, diverticulitis  . Basal cell cancer 05/2000   of nose, face (seen at [facility] 2015)  . Diabetes mellitus without complication ([place])   . Esophageal reflux   . History of colon polyps   . Hypertension   . Lipoma of unspecified site   . Pure hypercholesterolemia   . Tobacco use disorder   . Unspecified gastritis and gastroduodenitis without mention of hemorrhage   . Wrist fracture, left    as a child   Past Surgical History:  Procedure Laterality Date  . CATARACT EXTRACTION     2012  . CHOLECYSTECTOMY, LAPAROSCOPIC  12/20/06   [place]  . EYE SURGERY     trauma to right eye as a child, growth removed from eye 2 years later  . LAMINECTOMY  1984   L 4/5  . UPPER GI ENDOSCOPY  9/205   Family History  Problem Relation Age of Onset  . Diabetes Mother   . Heart disease Mother        MI  . Alzheimer's disease Mother        progressing  . Cancer Father        possible colon  . Diabetes Father   . Heart disease Father        CHF  . Stroke Father   . Hypertension Father   . Colon cancer Father  possible dx  . Hypertension Sister   . Colon polyps Sister   . Cancer Sister        breast cancer  . Cancer Brother        lung- smoker  . Diabetes Brother   . Colon polyps Brother   . Hypertension Sister   . Colon polyps Sister   . Depression Neg Hx   . Alcohol abuse Neg Hx   . Drug abuse Neg Hx   . Prostate cancer Neg Hx    Social History   Socioeconomic History  . Marital status: Married    Spouse name: Not on file  . Number of children: 3  . Years of education: Not on file  . Highest education level: Not on file  Occupational History  . Occupation: retired IT trainer, on disability for back pain  Social Needs  . Financial resource strain: Not hard at all  . Food  insecurity    Worry: Never true    Inability: Never true  . Transportation needs    Medical: No    Non-medical: No  Tobacco Use  . Smoking status: Former Smoker    Packs/day: 1.00    Years: 55.00    Pack years: 55.00    Quit date: 04/10/2017    Years since quitting: 1.9  . Smokeless tobacco: Current User    Types: Snuff  Substance and Sexual Activity  . Alcohol use: No    Alcohol/week: 0.0 standard drinks  . Drug use: No  . Sexual activity: Not on file  Lifestyle  . Physical activity    Days per week: 0 days    Minutes per session: 0 min  . Stress: Not at all  Relationships  . Social Herbalist on phone: Not on file    Gets together: Not on file    Attends religious service: Not on file    Active member of club or organization: Not on file    Attends meetings of clubs or organizations: Not on file    Relationship status: Not on file  Other Topics Concern  . Not on file  Social History Narrative   3 children initially- 2 sons locally, one daughter died at age 70 from tetrology of fallot.   [employer] [phone], domestic   Married 1970    Outpatient Encounter Medications as of 03/14/2019  Medication Sig  . NEOMYCIN-POLYMYXIN-HC, OPHTH, SUSP Apply 1 drop to eye 4 (four) times daily.  . pantoprazole (PROTONIX) 40 MG tablet TAKE 1 TABLET BY MOUTH EVERY DAY   No facility-administered encounter medications on file as of 03/14/2019.     Activities of Daily Living In your present state of health, do you have any difficulty performing the following activities: 03/14/2019  Hearing? Y  Comment wife states that patient turns the TV up loud  Vision? N  Difficulty concentrating or making decisions? N  Walking or climbing stairs? N  Dressing or bathing? N  Doing errands, shopping? N  Preparing Food and eating ? N  Using the Toilet? N  In the past six months, have you accidently leaked urine? N  Do you have problems with loss of bowel control? N  Managing your Medications? N   Managing your Finances? N  Housekeeping or managing your Housekeeping? N  Some recent data might be hidden    Patient Care Team: Tonia Ghent, MD as PCP - General   Assessment:   This is a routine wellness examination for [place].  Exercise Activities and Dietary recommendations Current Exercise Habits: The patient does not participate in regular exercise at present, Exercise limited by: None identified  Goals    . Patient Stated     Starting 03/10/2018, I will continue to walk at least 45 minutes daily.     . Patient Stated     03/14/2019, I will maintain and continue medications as prescribed.        Fall Risk Fall Risk  03/14/2019 03/10/2018 03/08/2017 03/04/2016 03/03/2015  Falls in the past year? 0 0 No No No  Number falls in past yr: 0 - - - -  Injury with Fall? 0 - - - -  Follow up Falls evaluation completed;Falls prevention discussed - - - -   Is the patient's home free of loose throw rugs in walkways, pet beds, electrical cords, etc?   yes      Grab bars in the bathroom? yes      Handrails on the stairs?   yes      Adequate lighting?   yes  Timed Get Up and Go Performed: N/A  Depression Screen PHQ 2/9 Scores 03/14/2019 03/10/2018 03/08/2017 03/04/2016  PHQ - 2 Score 0 0 0 0  PHQ- 9 Score 0 0 - -    Cognitive Function MMSE - Mini Mental State Exam 03/14/2019 03/10/2018  Orientation to time 5 5  Orientation to Place 5 5  Registration 3 3  Attention/ Calculation 5 0  Recall 3 3  Language- name 2 objects - 0  Language- repeat 1 1  Language- follow 3 step command - 3  Language- read & follow direction - 0  Write a sentence - 0  Copy design - 0  Total score - 20  Mini Cog  Mini-Cog screen was completed. Maximum score is 22. A value of 0 denotes this part of the MMSE was not completed or the patient failed this part of the Mini-Cog screening.       Immunization History  Administered Date(s) Administered  . Fluad Quad(high Dose 65+) 01/09/2019  .  Influenza Split 02/14/2012, 02/15/2014  . Influenza,inj,Quad PF,6+ Mos 02/27/2013, 03/03/2015, 03/04/2016, 03/08/2017, 02/09/2018  . Pneumococcal Conjugate-13 03/03/2015  . Pneumococcal Polysaccharide-23 03/04/2016  . Td 05/11/1996, 01/25/2007  . Zoster 05/21/2011    Qualifies for Shingles Vaccine? Yes  Screening Tests Health Maintenance  Topic Date Due  . FOOT EXAM  03/11/2019  . TETANUS/TDAP  05/10/2019 (Originally 01/24/2017)  . HEMOGLOBIN A1C  09/09/2019  . OPHTHALMOLOGY EXAM  02/19/2020  . URINE MICROALBUMIN  03/11/2020  . COLONOSCOPY  03/01/2022  . INFLUENZA VACCINE  Completed  . Hepatitis C Screening  Completed  . PNA vac Low Risk Adult  Completed   Cancer Screenings: Lung: Low Dose CT Chest recommended if Age 13-80 years, 30 pack-year currently smoking OR have quit w/in 15years. Patient does qualify and completed on 10/24/2018. Colorectal: completed 03/01/2017  Additional Screenings:  Hepatitis C Screening: 09/01/2015      Plan:    Patient wants to maintain and continue medications as prescribed.   I have personally reviewed and noted the following in the patient's chart:   . Medical and social history . Use of alcohol, tobacco or illicit drugs  . Current medications and supplements . Functional ability and status . Nutritional status . Physical activity . Advanced directives . List of other physicians . Hospitalizations, surgeries, and ER visits in previous 12 months . Vitals . Screenings to include cognitive, depression, and falls . Referrals and appointments  In addition, I have reviewed and discussed with patient certain preventive protocols, quality metrics, and best practice recommendations. A written personalized care plan for preventive services as well as general preventive health recommendations were provided to patient.     Andrez Grime, LPN  [phone]

## 2019-03-15 ENCOUNTER — Encounter: Payer: Self-pay | Admitting: Family Medicine

## 2019-03-15 ENCOUNTER — Ambulatory Visit: Payer: Medicare Other

## 2019-03-15 ENCOUNTER — Other Ambulatory Visit: Payer: Self-pay

## 2019-03-15 ENCOUNTER — Ambulatory Visit (INDEPENDENT_AMBULATORY_CARE_PROVIDER_SITE_OTHER): Payer: Medicare Other | Admitting: Family Medicine

## 2019-03-15 VITALS — BP 122/70 | HR 81 | Temp 97.6°F | Ht 72.0 in | Wt 218.2 lb

## 2019-03-15 DIAGNOSIS — K2270 Barrett's esophagus without dysplasia: Secondary | ICD-10-CM | POA: Diagnosis not present

## 2019-03-15 DIAGNOSIS — E119 Type 2 diabetes mellitus without complications: Secondary | ICD-10-CM

## 2019-03-15 DIAGNOSIS — Z0000 Encounter for general adult medical examination without abnormal findings: Secondary | ICD-10-CM | POA: Diagnosis not present

## 2019-03-15 DIAGNOSIS — Z7189 Other specified counseling: Secondary | ICD-10-CM

## 2019-03-15 MED ORDER — PANTOPRAZOLE SODIUM 40 MG PO TBEC: DELAYED_RELEASE_TABLET | ORAL | 3 refills | Status: DC

## 2019-03-15 MED ORDER — PRAVASTATIN SODIUM 10 MG PO TABS: 10.0000 mg | ORAL_TABLET | Freq: Every day | ORAL | 3 refills | Status: DC

## 2019-03-15 NOTE — Progress Notes (Signed)
He has some irritation on the L lower eyelid, with discharge improved, d/w pt.  Vision is still okay.     Diabetes:  No meds.  Hypoglycemic episodes: no sx  Hyperglycemic episodes: no sx Feet problems: no  Blood Sugars averaging: not checked.  eye exam within last year: yes Discussed statin indication.    H/o Barrett's on PPI.  Compliant.  No ADE on med.  Discussed.  He quit E cig.  Still dipping, d/w pt.   He has CT scanning of lungs ongoing, yearly.   Shingles and tetanus d/w pt.  See avs.  PNA and flu up to date.   Prostate cancer screening and PSA options (with potential risks and benefits of testing vs not testing) were discussed along with recent recs/guidelines.  He declined testing PSA at this point. He has mild LUTS but not at the point of needing treatment, d/w pt, mildly slower stream.   Advance directive- wife designated if patient were incapacitated. Colonoscopy 2018.    PMH and SH reviewed.   Vital signs, Meds and allergies reviewed.  ROS: Per HPI unless specifically indicated in ROS section   GEN: nad, alert and oriented HEENT: L lower eyelid irritation noted. Lids wnl o/w.  Conjunctiva wnl B.  ncat NECK: supple w/o LA CV: rrr PULM: ctab, no inc wob ABD: soft, +bs EXT: no edema SKIN: no acute rash  Diabetic foot exam: Normal inspection No skin breakdown No calluses  Normal DP pulses Normal sensation to light tough and monofilament Nails normal  The 10-year ASCVD risk score Mikey Bussing DC Jr., et al., 2013) is: 24.9%   Values used to calculate the score:     Age: 68 years     Sex: Male     Is Non-Hispanic African American: No     Diabetic: Yes     Tobacco smoker: No     Systolic Blood Pressure: 123XX123 mmHg     Is BP treated: No     HDL Cholesterol: 40.2 mg/dL     Total Cholesterol: 130 mg/dL

## 2019-03-15 NOTE — Patient Instructions (Addendum)
Check with your insurance to see if they will cover the shingrix and tetanus shots.   Even though your cholesterol isn't bad, diabetes still raises your risk of a heart attack or stroke.  Taking a low dose of pravastatin may lower your risk.  If you have aches on the medicine, then let me know and stop it.  Recheck lipids and A1c in about 6 months prior to a visit.  Take care.  Glad to see you.

## 2019-03-19 NOTE — Assessment & Plan Note (Signed)
Advance directive- wife designated if patient were incapacitated.  

## 2019-03-19 NOTE — Assessment & Plan Note (Signed)
Discussed statin indication and rationale for use.  Start pravastatin 10 mg a day to see if he can tolerate that.  If he has muscle aches then will stop and let me know.  Other labs discussed with patient.  Continue work on diet and exercise.  No new meds otherwise. Recheck lipids and A1c in about 6 months prior to a visit.  He agrees to plan. >25 minutes spent in face to face time with patient, >50% spent in counselling or coordination of care.

## 2019-03-19 NOTE — Assessment & Plan Note (Signed)
He quit E cig.  Still dipping, d/w pt.   He has CT scanning of lungs ongoing, yearly.   Shingles and tetanus d/w pt.  See avs.  PNA and flu up to date.   Prostate cancer screening and PSA options (with potential risks and benefits of testing vs not testing) were discussed along with recent recs/guidelines.  He declined testing PSA at this point. He has mild LUTS but not at the point of needing treatment, d/w pt, mildly slower stream.   Advance directive- wife designated if patient were incapacitated. Colonoscopy 2018.

## 2019-03-19 NOTE — Assessment & Plan Note (Signed)
H/o Barrett's on PPI.  Compliant.  No ADE on med.  Discussed.  Would continue as is.  He agrees.

## 2019-04-03 ENCOUNTER — Other Ambulatory Visit: Payer: Self-pay

## 2019-04-03 DIAGNOSIS — C44319 Basal cell carcinoma of skin of other parts of face: Secondary | ICD-10-CM | POA: Diagnosis not present

## 2019-04-10 DIAGNOSIS — H04002 Unspecified dacryoadenitis, left lacrimal gland: Secondary | ICD-10-CM | POA: Diagnosis not present

## 2019-04-11 DIAGNOSIS — H02102 Unspecified ectropion of right lower eyelid: Secondary | ICD-10-CM | POA: Diagnosis not present

## 2019-04-11 DIAGNOSIS — H02105 Unspecified ectropion of left lower eyelid: Secondary | ICD-10-CM | POA: Diagnosis not present

## 2019-04-11 DIAGNOSIS — Z01818 Encounter for other preprocedural examination: Secondary | ICD-10-CM | POA: Diagnosis not present

## 2019-04-23 DIAGNOSIS — H02102 Unspecified ectropion of right lower eyelid: Secondary | ICD-10-CM | POA: Diagnosis not present

## 2019-04-23 DIAGNOSIS — H02105 Unspecified ectropion of left lower eyelid: Secondary | ICD-10-CM | POA: Diagnosis not present

## 2019-08-22 DIAGNOSIS — D2272 Melanocytic nevi of left lower limb, including hip: Secondary | ICD-10-CM | POA: Diagnosis not present

## 2019-08-22 DIAGNOSIS — C44712 Basal cell carcinoma of skin of right lower limb, including hip: Secondary | ICD-10-CM | POA: Diagnosis not present

## 2019-08-22 DIAGNOSIS — B372 Candidiasis of skin and nail: Secondary | ICD-10-CM | POA: Diagnosis not present

## 2019-08-22 DIAGNOSIS — D2261 Melanocytic nevi of right upper limb, including shoulder: Secondary | ICD-10-CM | POA: Diagnosis not present

## 2019-08-22 DIAGNOSIS — L570 Actinic keratosis: Secondary | ICD-10-CM | POA: Diagnosis not present

## 2019-08-22 DIAGNOSIS — Z85828 Personal history of other malignant neoplasm of skin: Secondary | ICD-10-CM | POA: Diagnosis not present

## 2019-08-22 DIAGNOSIS — L821 Other seborrheic keratosis: Secondary | ICD-10-CM | POA: Diagnosis not present

## 2019-08-22 DIAGNOSIS — X32XXXA Exposure to sunlight, initial encounter: Secondary | ICD-10-CM | POA: Diagnosis not present

## 2019-08-22 DIAGNOSIS — D485 Neoplasm of uncertain behavior of skin: Secondary | ICD-10-CM | POA: Diagnosis not present

## 2019-08-22 DIAGNOSIS — C44519 Basal cell carcinoma of skin of other part of trunk: Secondary | ICD-10-CM | POA: Diagnosis not present

## 2019-08-22 DIAGNOSIS — D2262 Melanocytic nevi of left upper limb, including shoulder: Secondary | ICD-10-CM | POA: Diagnosis not present

## 2019-09-12 ENCOUNTER — Other Ambulatory Visit (INDEPENDENT_AMBULATORY_CARE_PROVIDER_SITE_OTHER): Payer: Medicare Other

## 2019-09-12 DIAGNOSIS — E119 Type 2 diabetes mellitus without complications: Secondary | ICD-10-CM

## 2019-09-12 LAB — LIPID PANEL
Cholesterol: 121 mg/dL (ref 0–200)
HDL: 48.4 mg/dL (ref >39.00)
LDL Cholesterol: 60 mg/dL (ref 0–99)
NonHDL: 72.77
Total CHOL/HDL Ratio: 3
Triglycerides: 65 mg/dL (ref 0.0–149.0)
VLDL: 13 mg/dL (ref 0.0–40.0)

## 2019-09-12 LAB — HEMOGLOBIN A1C: Hgb A1c MFr Bld: 6.9 % — ABNORMAL HIGH (ref 4.6–6.5)

## 2019-09-17 DIAGNOSIS — C44519 Basal cell carcinoma of skin of other part of trunk: Secondary | ICD-10-CM | POA: Diagnosis not present

## 2019-10-01 DIAGNOSIS — C44712 Basal cell carcinoma of skin of right lower limb, including hip: Secondary | ICD-10-CM | POA: Diagnosis not present

## 2019-10-18 ENCOUNTER — Telehealth: Payer: Self-pay

## 2019-10-18 DIAGNOSIS — Z87891 Personal history of nicotine dependence: Secondary | ICD-10-CM

## 2019-10-18 DIAGNOSIS — Z122 Encounter for screening for malignant neoplasm of respiratory organs: Secondary | ICD-10-CM

## 2019-10-18 NOTE — Telephone Encounter (Signed)
Message left notifying patient that it is time to schedule the low dose lung cancer screening CT scan.  Instructed patient to return call to Shawn Perkins at [phone] to verify information prior to CT scan being scheduled.    

## 2019-10-19 NOTE — Telephone Encounter (Signed)
Patient has been notified that annual lung cancer screening low dose CT scan is due currently or will be in near future. Confirmed that patient is within the age range of 55-77, and asymptomatic, (no signs or symptoms of lung cancer). Patient denies illness that would prevent curative treatment for lung cancer if found. Verified smoking history, (former, quit 04/10/17, 55 pack year). The shared decision making visit was done 03/23/17. Patient is agreeable for CT scan being scheduled.

## 2019-10-19 NOTE — Addendum Note (Signed)
Addended by: Lieutenant Diego on: 10/19/2019 10:02 AM   Modules accepted: Orders

## 2019-10-26 ENCOUNTER — Ambulatory Visit
Admission: RE | Admit: 2019-10-26 | Discharge: 2019-10-26 | Disposition: A | Discharge status: Home / Self Care | Payer: Medicare Other | Source: Ambulatory Visit | Attending: Oncology | Admitting: Oncology

## 2019-10-26 ENCOUNTER — Other Ambulatory Visit: Payer: Self-pay

## 2019-10-26 DIAGNOSIS — Z122 Encounter for screening for malignant neoplasm of respiratory organs: Secondary | ICD-10-CM | POA: Diagnosis present

## 2019-10-26 DIAGNOSIS — Z87891 Personal history of nicotine dependence: Secondary | ICD-10-CM

## 2019-10-31 ENCOUNTER — Encounter: Payer: Self-pay | Admitting: *Deleted

## 2020-02-05 DIAGNOSIS — L570 Actinic keratosis: Secondary | ICD-10-CM | POA: Diagnosis not present

## 2020-02-05 DIAGNOSIS — L821 Other seborrheic keratosis: Secondary | ICD-10-CM | POA: Diagnosis not present

## 2020-02-05 DIAGNOSIS — X32XXXA Exposure to sunlight, initial encounter: Secondary | ICD-10-CM | POA: Diagnosis not present

## 2020-02-05 DIAGNOSIS — D2261 Melanocytic nevi of right upper limb, including shoulder: Secondary | ICD-10-CM | POA: Diagnosis not present

## 2020-02-05 DIAGNOSIS — D2272 Melanocytic nevi of left lower limb, including hip: Secondary | ICD-10-CM | POA: Diagnosis not present

## 2020-02-05 DIAGNOSIS — C44622 Squamous cell carcinoma of skin of right upper limb, including shoulder: Secondary | ICD-10-CM | POA: Diagnosis not present

## 2020-02-05 DIAGNOSIS — D0462 Carcinoma in situ of skin of left upper limb, including shoulder: Secondary | ICD-10-CM | POA: Diagnosis not present

## 2020-02-05 DIAGNOSIS — C44319 Basal cell carcinoma of skin of other parts of face: Secondary | ICD-10-CM | POA: Diagnosis not present

## 2020-02-05 DIAGNOSIS — D485 Neoplasm of uncertain behavior of skin: Secondary | ICD-10-CM | POA: Diagnosis not present

## 2020-02-05 DIAGNOSIS — D2262 Melanocytic nevi of left upper limb, including shoulder: Secondary | ICD-10-CM | POA: Diagnosis not present

## 2020-02-05 DIAGNOSIS — Z85828 Personal history of other malignant neoplasm of skin: Secondary | ICD-10-CM | POA: Diagnosis not present

## 2020-02-05 DIAGNOSIS — H02105 Unspecified ectropion of left lower eyelid: Secondary | ICD-10-CM | POA: Diagnosis not present

## 2020-02-08 ENCOUNTER — Telehealth: Payer: Self-pay | Admitting: *Deleted

## 2020-02-08 MED ORDER — NICOTINAMIDE POWD: 0 refills | Status: DC

## 2020-02-08 NOTE — Telephone Encounter (Signed)
Patient's wife Brian Hull left a voicemail stating that his dermatologist has recommended that he take a OTC supplement. Brian Hull stated it is Nicotinamine 500 mg and they want to make sure he can take it with all of the other medications that he takes.

## 2020-02-08 NOTE — Telephone Encounter (Signed)
Should be okay to do.  Thanks.

## 2020-02-11 NOTE — Telephone Encounter (Signed)
Agreed.  Thanks. I'll defer.

## 2020-02-11 NOTE — Telephone Encounter (Signed)
Patient and his wife notified as instructed by telephone. Patient's wife stated that his dermatologist told him to take two a day even though the bottle shows one a day. Patient was advise that should be fine if that is what is doctor recommended.

## 2020-02-20 DIAGNOSIS — E119 Type 2 diabetes mellitus without complications: Secondary | ICD-10-CM | POA: Diagnosis not present

## 2020-02-26 ENCOUNTER — Other Ambulatory Visit: Payer: Self-pay | Admitting: Family Medicine

## 2020-03-02 ENCOUNTER — Other Ambulatory Visit: Payer: Self-pay | Admitting: Family Medicine

## 2020-03-02 DIAGNOSIS — E119 Type 2 diabetes mellitus without complications: Secondary | ICD-10-CM

## 2020-03-13 ENCOUNTER — Other Ambulatory Visit: Payer: Self-pay

## 2020-03-13 ENCOUNTER — Other Ambulatory Visit (INDEPENDENT_AMBULATORY_CARE_PROVIDER_SITE_OTHER): Payer: Medicare Other

## 2020-03-13 DIAGNOSIS — E119 Type 2 diabetes mellitus without complications: Secondary | ICD-10-CM

## 2020-03-13 LAB — LIPID PANEL
Cholesterol: 118 mg/dL (ref 0–200)
HDL: 44.9 mg/dL (ref >39.00)
LDL Cholesterol: 58 mg/dL (ref 0–99)
NonHDL: 73.36
Total CHOL/HDL Ratio: 3
Triglycerides: 78 mg/dL (ref 0.0–149.0)
VLDL: 15.6 mg/dL (ref 0.0–40.0)

## 2020-03-13 LAB — COMPREHENSIVE METABOLIC PANEL
ALT: 28 U/L (ref 0–53)
AST: 21 U/L (ref 0–37)
Albumin: 4.1 g/dL (ref 3.5–5.2)
Alkaline Phosphatase: 30 U/L — ABNORMAL LOW (ref 39–117)
BUN: 19 mg/dL (ref 6–23)
CO2: 30 mEq/L (ref 19–32)
Calcium: 9 mg/dL (ref 8.4–10.5)
Chloride: 105 mEq/L (ref 96–112)
Creatinine, Ser: 1.29 mg/dL (ref 0.40–1.50)
GFR: 56.04 mL/min — ABNORMAL LOW (ref >60.00)
Glucose, Bld: 162 mg/dL — ABNORMAL HIGH (ref 70–99)
Potassium: 4.6 mEq/L (ref 3.5–5.1)
Sodium: 140 mEq/L (ref 135–145)
Total Bilirubin: 0.6 mg/dL (ref 0.2–1.2)
Total Protein: 6.5 g/dL (ref 6.0–8.3)

## 2020-03-13 LAB — HEMOGLOBIN A1C: Hgb A1c MFr Bld: 7.5 % — ABNORMAL HIGH (ref 4.6–6.5)

## 2020-03-13 LAB — MICROALBUMIN / CREATININE URINE RATIO
Creatinine,U: 252.5 mg/dL
Microalb Creat Ratio: 0.4 mg/g (ref 0.0–30.0)
Microalb, Ur: 1 mg/dL (ref 0.0–1.9)

## 2020-03-14 ENCOUNTER — Ambulatory Visit (INDEPENDENT_AMBULATORY_CARE_PROVIDER_SITE_OTHER): Payer: Medicare Other

## 2020-03-14 DIAGNOSIS — Z0000 Encounter for general adult medical examination without abnormal findings: Secondary | ICD-10-CM | POA: Diagnosis not present

## 2020-03-14 NOTE — Progress Notes (Signed)
PCP notes:  Health Maintenance: Flu- due Tdap- insurance Foot exam- due   Abnormal Screenings: none   Patient concerns: Some shortness of breath when exerted   Nurse concerns: none   Next PCP appt.: 03/17/2020 @ 10 am

## 2020-03-14 NOTE — Progress Notes (Addendum)
Subjective:   Brian Hull. is a 69 y.o. male who presents for Medicare Annual/Subsequent preventive examination.  Review of Systems: N/A      I connected with the patient today by telephone and verified that I am speaking with the correct person using two identifiers. Location patient: home Location nurse: work Persons participating in the telephone visit: patient, nurse.   I discussed the limitations, risks, security and privacy concerns of performing an evaluation and management service by telephone and the availability of in person appointments. I also discussed with the patient that there may be a patient responsible charge related to this service. The patient expressed understanding and verbally consented to this telephonic visit.        Cardiac Risk Factors include: advanced age (>24men, >61 women);male gender;diabetes mellitus     Objective:    Today's Vitals   There is no height or weight on file to calculate BMI.  Advanced Directives 03/14/2020 03/14/2019 03/10/2018  Does Patient Have a Medical Advance Directive? No No No  Does patient want to make changes to medical advance directive? No - Patient declined - -  Would patient like information on creating a medical advance directive? - No - Patient declined Yes (MAU/Ambulatory/Procedural Areas - Information given)    Current Medications (verified) Outpatient Encounter Medications as of 03/14/2020  Medication Sig  . NEOMYCIN-POLYMYXIN-HC, OPHTH, SUSP Apply 1 drop to eye 4 (four) times daily.  . Niacinamide (NICOTINAMIDE) POWD Per dermatology.  . pantoprazole (PROTONIX) 40 MG tablet TAKE 1 TABLET BY MOUTH EVERY DAY  . pravastatin (PRAVACHOL) 10 MG tablet TAKE 1 TABLET BY MOUTH EVERY DAY   No facility-administered encounter medications on file as of 03/14/2020.    Allergies (verified) Azithromycin, [name], [name]   History: Past Medical History:  Diagnosis Date  . 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 Barrett's esophagus 05/08/02, 02/15/05, 2009   EGD- Barrett's esoph, HH, gastritis, diverticulitis  . Basal cell cancer 05/2000   of nose, face (seen at [facility] 2015)  . Diabetes mellitus without complication ([place])   . Esophageal reflux   . History of colon polyps   . Hypertension   . Lipoma of unspecified site   . Pure hypercholesterolemia   . Tobacco use disorder   . Unspecified gastritis and gastroduodenitis without mention of hemorrhage   . Wrist fracture, left    as a child   Past Surgical History:  Procedure Laterality Date  . CATARACT EXTRACTION     2012  . CHOLECYSTECTOMY, LAPAROSCOPIC  12/20/06   [facility]  . EYE SURGERY     trauma to right eye as a child, growth removed from eye 2 years later  . LAMINECTOMY  1984   L 4/5  . UPPER GI ENDOSCOPY  9/205   Family History  Problem Relation Age of Onset  . Diabetes Mother   . Heart disease Mother        MI  . Alzheimer's disease Mother        progressing  . Cancer Father        possible colon  . Diabetes Father   . Heart disease Father        CHF  . Stroke Father   . Hypertension Father   . Colon cancer Father        possible dx  . Hypertension Sister   . Colon polyps Sister   . Cancer Sister  breast cancer  . Cancer Brother        lung- smoker  . Diabetes Brother   . Colon polyps Brother   . Hypertension Sister   . Colon polyps Sister   . Depression Neg Hx   . Alcohol abuse Neg Hx   . Drug abuse Neg Hx   . Prostate cancer Neg Hx    Social History   Socioeconomic History  . Marital status: Married    Spouse name: Not on file  . Number of children: 3  . Years of education: Not on file  . Highest education level: Not on file  Occupational History  . Occupation: retired IT trainer, on disability for back pain  Tobacco Use  . Smoking status: Former Smoker    Packs/day: 1.00    Years: 55.00    Pack years: 55.00    Quit date: 04/10/2017    Years since quitting: 2.9  . Smokeless  tobacco: Current User    Types: Snuff  Vaping Use  . Vaping Use: Former  Substance and Sexual Activity  . Alcohol use: No    Alcohol/week: 0.0 standard drinks  . Drug use: No  . Sexual activity: Not on file  Other Topics Concern  . Not on file  Social History Narrative   3 children initially- 2 sons locally, one daughter died at age 12 from tetrology of fallot.   [employer] [phone], domestic   Married 1970   Social Determinants of Radio broadcast assistant Strain: Low Risk   . Difficulty of Paying Living Expenses: Not hard at all  Food Insecurity: No Food Insecurity  . Worried About Charity fundraiser in the Last Year: Never true  . Ran Out of Food in the Last Year: Never true  Transportation Needs: No Transportation Needs  . Lack of Transportation (Medical): No  . Lack of Transportation (Non-Medical): No  Physical Activity: Inactive  . Days of Exercise per Week: 0 days  . Minutes of Exercise per Session: 0 min  Stress: No Stress Concern Present  . Feeling of Stress : Not at all  Social Connections:   . Frequency of Communication with Friends and Family: Not on file  . Frequency of Social Gatherings with Friends and Family: Not on file  . Attends Religious Services: Not on file  . Active Member of Clubs or Organizations: Not on file  . Attends Archivist Meetings: Not on file  . Marital Status: Not on file    Tobacco Counseling Ready to quit: Not Answered Counseling given: Not Answered   Clinical Intake:  Pre-visit preparation completed: Yes  Pain : No/denies pain     Nutritional Risks: Nausea/ vomitting/ diarrhea (diarrhea once a week) Diabetes: Yes CBG done?: No Did pt. bring in CBG monitor from home?: No, telephone visit  How often do you need to have someone help you when you read instructions, pamphlets, or other written materials from your doctor or pharmacy?: 1 - Never What is the last grade level you completed in school?: some  college  Diabetic: Yes Nutrition Risk Assessment:  Has the patient had any N/V/D within the last 2 months? Diarrhea once a week  Does the patient have any non-healing wounds?  No  Has the patient had any unintentional weight loss or weight gain?  No   Diabetes:  Is the patient diabetic?  Yes  If diabetic, was a CBG obtained today?  No  Did the patient bring in their glucometer from  home?  N/A, telephone visit How often do you monitor your CBG's? When needed.   Financial Strains and Diabetes Management:  Are you having any financial strains with the device, your supplies or your medication? No .  Does the patient want to be seen by Chronic Care Management for management of their diabetes?  No  Would the patient like to be referred to a Nutritionist or for Diabetic Management?  No   Diabetic Exams:  Diabetic Eye Exam: Completed 02/20/2020 Diabetic Foot Exam: Overdue, Pt has been advised about the importance in completing this exam. Pt is scheduled for diabetic foot exam on 03/17/2020.   Interpreter Needed?: No  Information entered by :: CJohnson, LPN   Activities of Daily Living In your present state of health, do you have any difficulty performing the following activities: 03/14/2020  Hearing? Y  Comment turns TV up loud  Vision? N  Difficulty concentrating or making decisions? N  Walking or climbing stairs? N  Dressing or bathing? N  Doing errands, shopping? N  Preparing Food and eating ? N  Using the Toilet? N  In the past six months, have you accidently leaked urine? N  Do you have problems with loss of bowel control? N  Managing your Medications? N  Managing your Finances? N  Housekeeping or managing your Housekeeping? N  Some recent data might be hidden    Patient Care Team: Tonia Ghent, MD as PCP - General  Indicate any recent Medical Services you may have received from other than [facility] providers in the past year (date may be approximate).      Assessment:   This is a routine wellness examination for [place].  Hearing/Vision screen  Hearing Screening   125Hz  250Hz  500Hz  1000Hz  2000Hz  3000Hz  4000Hz  6000Hz  8000Hz   Right ear:           Left ear:           Vision Screening Comments: Patient gets annual eye exams  Dietary issues and exercise activities discussed: Current Exercise Habits: The patient does not participate in regular exercise at present, Exercise limited by: None identified  Goals    . Patient Stated     Starting 03/10/2018, I will continue to walk at least 45 minutes daily.     . Patient Stated     03/14/2019, I will maintain and continue medications as prescribed.     . Patient Stated     03/14/2020, I will maintain and continue medications as prescribed.       Depression Screen PHQ 2/9 Scores 03/14/2020 03/14/2019 03/10/2018 03/08/2017 03/04/2016 03/03/2015  PHQ - 2 Score 0 0 0 0 0 0  PHQ- 9 Score 0 0 0 - - -    Fall Risk Fall Risk  03/14/2020 04/03/2019 03/14/2019 03/10/2018 03/08/2017  Falls in the past year? 0 0 0 0 No  Comment - Emmi Telephone Survey: data to providers prior to load - - -  Number falls in past yr: 0 - 0 - -  Injury with Fall? 0 - 0 - -  Risk for fall due to : No Fall Risks - - - -  Follow up Falls evaluation completed;Falls prevention discussed - Falls evaluation completed;Falls prevention discussed - -    Any stairs in or around the home? Yes  If so, are there any without handrails? No  Home free of loose throw rugs in walkways, pet beds, electrical cords, etc? Yes  Adequate lighting in your home to reduce risk of  falls? Yes   ASSISTIVE DEVICES UTILIZED TO PREVENT FALLS:  Life alert? No  Use of a cane, walker or w/c? No  Grab bars in the bathroom? No  Shower chair or bench in shower? No  Elevated toilet seat or a handicapped toilet? No   TIMED UP AND GO:  Was the test performed? N/A, telephonic visit.   Cognitive Function: MMSE - Mini Mental State Exam 03/14/2020 03/14/2019  03/10/2018  Orientation to time 5 5 5   Orientation to Place 5 5 5   Registration 3 3 3   Attention/ Calculation 5 5 0  Recall 3 3 3   Language- name 2 objects - - 0  Language- repeat 1 1 1   Language- follow 3 step command - - 3  Language- read & follow direction - - 0  Write a sentence - - 0  Copy design - - 0  Total score - - 20  Mini Cog  Mini-Cog screen was completed. Maximum score is 22. A value of 0 denotes this part of the MMSE was not completed or the patient failed this part of the Mini-Cog screening.       Immunizations Immunization History  Administered Date(s) Administered  . Fluad Quad(high Dose 65+) 01/09/2019  . Influenza Split 02/14/2012, 02/15/2014  . Influenza,inj,Quad PF,6+ Mos 02/27/2013, 03/03/2015, 03/04/2016, 03/08/2017, 02/09/2018  . PFIZER SARS-COV-2 Vaccination 07/24/2019, 08/14/2019  . Pneumococcal Conjugate-13 03/03/2015  . Pneumococcal Polysaccharide-23 03/04/2016  . Td 05/11/1996, 01/25/2007  . Zoster 05/21/2011    TDAP status: Due, Education has been provided regarding the importance of this vaccine. Advised may receive this vaccine at local pharmacy or Health Dept. Aware to provide a copy of the vaccination record if obtained from local pharmacy or Health Dept. Verbalized acceptance and understanding. Flu Vaccine status: due, will get at upcoming physical  Pneumococcal vaccine status: Up to date Covid-19 vaccine status: Completed vaccines  Qualifies for Shingles Vaccine? Yes   Zostavax completed Yes   Shingrix Completed?: No.    Education has been provided regarding the importance of this vaccine. Patient has been advised to call insurance company to determine out of pocket expense if they have not yet received this vaccine. Advised may also receive vaccine at local pharmacy or Health Dept. Verbalized acceptance and understanding.  Screening Tests Health Maintenance  Topic Date Due  . INFLUENZA VACCINE  12/09/2019  . FOOT EXAM  03/14/2020  .  TETANUS/TDAP  03/14/2024 (Originally 01/24/2017)  . HEMOGLOBIN A1C  09/10/2020  . OPHTHALMOLOGY EXAM  02/19/2021  . URINE MICROALBUMIN  03/13/2021  . COLONOSCOPY  03/01/2022  . COVID-19 Vaccine  Completed  . Hepatitis C Screening  Completed  . PNA vac Low Risk Adult  Completed    Health Maintenance  Health Maintenance Due  Topic Date Due  . INFLUENZA VACCINE  12/09/2019  . FOOT EXAM  03/14/2020    Colorectal cancer screening: Completed 03/01/2017. Repeat every 5 years  Lung Cancer Screening: (Low Dose CT Chest recommended if Age 21-80 years, 30 pack-year currently smoking OR have quit w/in 15 years.) does not qualify.    Additional Screening:  Hepatitis C Screening: does qualify; Completed 09/01/2015  Vision Screening: Recommended annual ophthalmology exams for early detection of glaucoma and other disorders of the eye. Is the patient up to date with their annual eye exam?  Yes  Who is the provider or what is the name of the office in which the patient attends annual eye exams? Dr. Glennon Mac If pt is not established with a provider,  would they like to be referred to a provider to establish care? No .   Dental Screening: Recommended annual dental exams for proper oral hygiene  Community Resource Referral / Chronic Care Management: CRR required this visit?  No   CCM required this visit?  No      Plan:     I have personally reviewed and noted the following in the patient's chart:   . Medical and social history . Use of alcohol, tobacco or illicit drugs  . Current medications and supplements . Functional ability and status . Nutritional status . Physical activity . Advanced directives . List of other physicians . Hospitalizations, surgeries, and ER visits in previous 12 months . Vitals . Screenings to include cognitive, depression, and falls . Referrals and appointments  In addition, I have reviewed and discussed with patient certain preventive protocols, quality  metrics, and best practice recommendations. A written personalized care plan for preventive services as well as general preventive health recommendations were provided to patient.   Due to this being a telephonic visit, the after visit summary with patients personalized plan was offered to patient via office or my-chart. Patient preferred to pick up at office at next visit or via mychart.   Andrez Grime, LPN   [id number]/12/[id number]

## 2020-03-14 NOTE — Patient Instructions (Signed)
[name] , Thank you for taking time to come for your Medicare Wellness Visit. I appreciate your ongoing commitment to your health goals. Please review the following plan we discussed and let me know if I can assist you in the future.   Screening recommendations/referrals: Colonoscopy: Up to date, completed 03/01/2017, due 02/2022  Recommended yearly ophthalmology/optometry visit for glaucoma screening and checkup Recommended yearly dental visit for hygiene and checkup  Vaccinations: Influenza vaccine: due, will get at upcoming physical Pneumococcal vaccine: Completed series Tdap vaccine: decline, insurance  Shingles vaccine: due, check with your insurance regarding coverage if interested    Covid-19: Completed series  Advanced directives: Advance directive discussed with you today. Even though you declined this today please call our office should you change your mind and we can give you the proper paperwork for you to fill out.  Conditions/risks identified: Diabetes  Next appointment: Follow up in one year for your annual wellness visit.   Preventive Care 64 Years and Older, Male Preventive care refers to lifestyle choices and visits with your health care provider that can promote health and wellness. What does preventive care include?  A yearly physical exam. This is also called an annual well check.  Dental exams once or twice a year.  Routine eye exams. Ask your health care provider how often you should have your eyes checked.  Personal lifestyle choices, including:  Daily care of your teeth and gums.  Regular physical activity.  Eating a healthy diet.  Avoiding tobacco and drug use.  Limiting alcohol use.  Practicing safe sex.  Taking low doses of aspirin every day.  Taking vitamin and mineral supplements as recommended by your health care provider. What happens during an annual well check? The services and screenings done by your health care provider during  your annual well check will depend on your age, overall health, lifestyle risk factors, and family history of disease. Counseling  Your health care provider may ask you questions about your:  Alcohol use.  Tobacco use.  Drug use.  Emotional well-being.  Home and relationship well-being.  Sexual activity.  Eating habits.  History of falls.  Memory and ability to understand (cognition).  Work and work Statistician. Screening  You may have the following tests or measurements:  Height, weight, and BMI.  Blood pressure.  Lipid and cholesterol levels. These may be checked every 5 years, or more frequently if you are over 16 years old.  Skin check.  Lung cancer screening. You may have this screening every year starting at age 14 if you have a 30-pack-year history of smoking and currently smoke or have quit within the past 15 years.  Fecal occult blood test (FOBT) of the stool. You may have this test every year starting at age 58.  Flexible sigmoidoscopy or colonoscopy. You may have a sigmoidoscopy every 5 years or a colonoscopy every 10 years starting at age 70.  Prostate cancer screening. Recommendations will vary depending on your family history and other risks.  Hepatitis C blood test.  Hepatitis B blood test.  Sexually transmitted disease (STD) testing.  Diabetes screening. This is done by checking your blood sugar (glucose) after you have not eaten for a while (fasting). You may have this done every 1-3 years.  Abdominal aortic aneurysm (AAA) screening. You may need this if you are a current or former smoker.  Osteoporosis. You may be screened starting at age 33 if you are at high risk. Talk with your health care provider  about your test results, treatment options, and if necessary, the need for more tests. Vaccines  Your health care provider may recommend certain vaccines, such as:  Influenza vaccine. This is recommended every year.  Tetanus, diphtheria, and  acellular pertussis (Tdap, Td) vaccine. You may need a Td booster every 10 years.  Zoster vaccine. You may need this after age 33.  Pneumococcal 13-valent conjugate (PCV13) vaccine. One dose is recommended after age 45.  Pneumococcal polysaccharide (PPSV23) vaccine. One dose is recommended after age 27. Talk to your health care provider about which screenings and vaccines you need and how often you need them. This information is not intended to replace advice given to you by your health care provider. Make sure you discuss any questions you have with your health care provider. Document Released: 05/23/2015 Document Revised: 01/14/2016 Document Reviewed: 02/25/2015 Elsevier Interactive Patient Education  2017 [place] Prevention in the Home Falls can cause injuries. They can happen to people of all ages. There are many things you can do to make your home safe and to help prevent falls. What can I do on the outside of my home?  Regularly fix the edges of walkways and driveways and fix any cracks.  Remove anything that might make you trip as you walk through a door, such as a raised step or threshold.  Trim any bushes or trees on the path to your home.  Use bright outdoor lighting.  Clear any walking paths of anything that might make someone trip, such as rocks or tools.  Regularly check to see if handrails are loose or broken. Make sure that both sides of any steps have handrails.  Any raised decks and porches should have guardrails on the edges.  Have any leaves, snow, or ice cleared regularly.  Use sand or salt on walking paths during winter.  Clean up any spills in your garage right away. This includes oil or grease spills. What can I do in the bathroom?  Use night lights.  Install grab bars by the toilet and in the tub and shower. Do not use towel bars as grab bars.  Use non-skid mats or decals in the tub or shower.  If you need to sit down in the shower, use  a plastic, non-slip stool.  Keep the floor dry. Clean up any water that spills on the floor as soon as it happens.  Remove soap buildup in the tub or shower regularly.  Attach bath mats securely with double-sided non-slip rug tape.  Do not have throw rugs and other things on the floor that can make you trip. What can I do in the bedroom?  Use night lights.  Make sure that you have a light by your bed that is easy to reach.  Do not use any sheets or blankets that are too big for your bed. They should not hang down onto the floor.  Have a firm chair that has side arms. You can use this for support while you get dressed.  Do not have throw rugs and other things on the floor that can make you trip. What can I do in the kitchen?  Clean up any spills right away.  Avoid walking on wet floors.  Keep items that you use a lot in easy-to-reach places.  If you need to reach something above you, use a strong step stool that has a grab bar.  Keep electrical cords out of the way.  Do not use floor polish or  wax that makes floors slippery. If you must use wax, use non-skid floor wax.  Do not have throw rugs and other things on the floor that can make you trip. What can I do with my stairs?  Do not leave any items on the stairs.  Make sure that there are handrails on both sides of the stairs and use them. Fix handrails that are broken or loose. Make sure that handrails are as long as the stairways.  Check any carpeting to make sure that it is firmly attached to the stairs. Fix any carpet that is loose or worn.  Avoid having throw rugs at the top or bottom of the stairs. If you do have throw rugs, attach them to the floor with carpet tape.  Make sure that you have a light switch at the top of the stairs and the bottom of the stairs. If you do not have them, ask someone to add them for you. What else can I do to help prevent falls?  Wear shoes that:  Do not have high heels.  Have  rubber bottoms.  Are comfortable and fit you well.  Are closed at the toe. Do not wear sandals.  If you use a stepladder:  Make sure that it is fully opened. Do not climb a closed stepladder.  Make sure that both sides of the stepladder are locked into place.  Ask someone to hold it for you, if possible.  Clearly mark and make sure that you can see:  Any grab bars or handrails.  First and last steps.  Where the edge of each step is.  Use tools that help you move around (mobility aids) if they are needed. These include:  Canes.  Walkers.  Scooters.  Crutches.  Turn on the lights when you go into a dark area. Replace any light bulbs as soon as they burn out.  Set up your furniture so you have a clear path. Avoid moving your furniture around.  If any of your floors are uneven, fix them.  If there are any pets around you, be aware of where they are.  Review your medicines with your doctor. Some medicines can make you feel dizzy. This can increase your chance of falling. Ask your doctor what other things that you can do to help prevent falls. This information is not intended to replace advice given to you by your health care provider. Make sure you discuss any questions you have with your health care provider. Document Released: 02/20/2009 Document Revised: 10/02/2015 Document Reviewed: 05/31/2014 Elsevier Interactive Patient Education  2017 [employer].

## 2020-03-17 ENCOUNTER — Other Ambulatory Visit: Payer: Self-pay

## 2020-03-17 ENCOUNTER — Encounter: Payer: Self-pay | Admitting: Family Medicine

## 2020-03-17 ENCOUNTER — Ambulatory Visit (INDEPENDENT_AMBULATORY_CARE_PROVIDER_SITE_OTHER): Payer: Medicare Other | Admitting: Family Medicine

## 2020-03-17 VITALS — BP 120/70 | HR 69 | Temp 97.4°F | Ht 71.33 in | Wt 218.0 lb

## 2020-03-17 DIAGNOSIS — Z0000 Encounter for general adult medical examination without abnormal findings: Secondary | ICD-10-CM

## 2020-03-17 DIAGNOSIS — E119 Type 2 diabetes mellitus without complications: Secondary | ICD-10-CM

## 2020-03-17 DIAGNOSIS — Z23 Encounter for immunization: Secondary | ICD-10-CM

## 2020-03-17 DIAGNOSIS — K2270 Barrett's esophagus without dysplasia: Secondary | ICD-10-CM

## 2020-03-17 DIAGNOSIS — Z7189 Other specified counseling: Secondary | ICD-10-CM

## 2020-03-17 MED ORDER — PRAVASTATIN SODIUM 10 MG PO TABS
10.0000 mg | ORAL_TABLET | Freq: Every day | ORAL | 3 refills | Status: DC
Start: 2020-03-17 — End: 2021-05-07

## 2020-03-17 MED ORDER — PANTOPRAZOLE SODIUM 40 MG PO TBEC
DELAYED_RELEASE_TABLET | ORAL | 3 refills | Status: DC
Start: 2020-03-17 — End: 2021-05-07

## 2020-03-17 NOTE — Progress Notes (Signed)
This visit occurred during the SARS-CoV-2 public health emergency.  Safety protocols were in place, including screening questions prior to the visit, additional usage of staff PPE, and extensive cleaning of exam room while observing appropriate contact time as indicated for disinfecting solutions.  Diabetes:  No meds for glycose control.   Hypoglycemic episodes: no sx Hyperglycemic episodes: no sx Feet problems: no  Blood Sugars averaging: not checked frequently.   eye exam within last year: yes Statin use d/w pt.   Usually not short of breath when exerting himself- he can still do his yardwork and walk up a slope.  He can get SOB when getting out of the shower, unclear if that is related to humidity in the shower.  Discussed.  He has CT scanning of lungs ongoing, yearly.  Shingles and tetanus d/w pt. See avs.  PNA up to date Flu 2021 covid vaccine 2021.   Prostate cancer screening and PSA options(with potential risks and benefits of testing vs not testing) were discussed along with recent recs/guidelines. He declined testing PSAat this point.  Advance directive- wife designated if patient were incapacitated. Colonoscopy 2018.    Lung scanning d/w pt.   IMPRESSION: 1. Lung-RADS 2, benign appearance or behavior. Continue annual screening with low-dose chest CT without contrast in 12 months. 2. Hepatic steatosis. 3. Aortic Atherosclerosis (ICD10-I70.0) and Emphysema (ICD10-J43.9).  H/o Barrett's. On PPI.  Swallowing well.  Compliant.  No blood in stool. D/w pt about GI f/u.      L lower eyelid irritated, with eye clinic f/u pending.  He has derm f/u pending also re: skin cancer excision.    PMH and SH reviewed  Meds, vitals, and allergies reviewed.   ROS: Per HPI unless specifically indicated in ROS section   GEN: nad, alert and oriented HEENT: ncat, L lower eyelid irritation noted.   NECK: supple w/o LA CV: rrr. PULM: ctab, no inc wob ABD: soft, +bs EXT: no  edema SKIN: no acute rash  Diabetic foot exam: Normal inspection No skin breakdown No calluses  Normal DP pulses Normal sensation to light touch and monofilament Nails normal  At least 30 minutes were devoted to patient care in this encounter (this can potentially include time spent reviewing the patient's file/history, interviewing and examining the patient, counseling/reviewing plan with patient, ordering referrals, ordering tests, reviewing relevant laboratory or x-ray data, and documenting the encounter).

## 2020-03-17 NOTE — Patient Instructions (Addendum)
Check to see what meter is covered and let me know.   Check with your insurance to see if they will cover the shingles and tetanus shot. Call Dr. Percell [place] clinic and see when they want to see you again.   Take care.  Glad to see you.  Recheck here in about 3 months. A1c at the visit.

## 2020-03-19 NOTE — Assessment & Plan Note (Signed)
Statin use d/w pt. start pravastatin with routine cautions.  See follow-up note regarding getting a blood sugar meter.  He can check his sugar and update me as needed.  We can recheck his A1c periodically.  Continue work on diet and exercise.

## 2020-03-19 NOTE — Telephone Encounter (Signed)
I sent the patient a note.  Please send prescription for meter/strips/lancets.  Check sugar daily as needed.  Diagnosis E 11.9.  Thanks.

## 2020-03-19 NOTE — Assessment & Plan Note (Signed)
Advance directive- wife designated if patient were incapacitated.  

## 2020-03-19 NOTE — Assessment & Plan Note (Signed)
He has CT scanning of lungs ongoing, yearly.  Shingles and tetanus d/w pt. See avs.  PNA up to date Flu 2021 covid vaccine 2021.   Prostate cancer screening and PSA options(with potential risks and benefits of testing vs not testing) were discussed along with recent recs/guidelines. He declined testing PSAat this point.  Advance directive- wife designated if patient were incapacitated. Colonoscopy 2018.

## 2020-03-19 NOTE — Assessment & Plan Note (Signed)
H/o Barrett's. On PPI.  Swallowing well.  Compliant.  No blood in stool. D/w pt about GI f/u.    --------- see follow-up notes.  Patient contact the GI clinic and does not need follow-up EGD based on his most recent endoscopy results.

## 2020-03-21 ENCOUNTER — Other Ambulatory Visit: Payer: Self-pay

## 2020-03-21 MED ORDER — BLOOD GLUCOSE METER KIT: PACK | 0 refills | Status: DC

## 2020-03-21 MED ORDER — BLOOD GLUCOSE METER KIT
PACK | 0 refills | Status: DC
Start: 2020-03-21 — End: 2020-03-26

## 2020-03-25 NOTE — Telephone Encounter (Signed)
Order sent to pharmacy  

## 2020-03-26 ENCOUNTER — Other Ambulatory Visit: Payer: Self-pay

## 2020-03-26 MED ORDER — BLOOD GLUCOSE METER KIT
PACK | 0 refills | Status: DC
Start: 2020-03-26 — End: 2020-03-31

## 2020-03-31 ENCOUNTER — Other Ambulatory Visit: Payer: Self-pay

## 2020-03-31 DIAGNOSIS — C44622 Squamous cell carcinoma of skin of right upper limb, including shoulder: Secondary | ICD-10-CM | POA: Diagnosis not present

## 2020-03-31 MED ORDER — BLOOD GLUCOSE METER KIT: PACK | 0 refills | Status: AC

## 2020-04-14 DIAGNOSIS — C44319 Basal cell carcinoma of skin of other parts of face: Secondary | ICD-10-CM | POA: Diagnosis not present

## 2020-04-21 DIAGNOSIS — D0462 Carcinoma in situ of skin of left upper limb, including shoulder: Secondary | ICD-10-CM | POA: Diagnosis not present

## 2020-06-02 DIAGNOSIS — H02403 Unspecified ptosis of bilateral eyelids: Secondary | ICD-10-CM | POA: Diagnosis not present

## 2020-06-02 DIAGNOSIS — H02115 Cicatricial ectropion of left lower eyelid: Secondary | ICD-10-CM | POA: Diagnosis not present

## 2020-06-02 DIAGNOSIS — H02112 Cicatricial ectropion of right lower eyelid: Secondary | ICD-10-CM | POA: Diagnosis not present

## 2020-06-10 DIAGNOSIS — D485 Neoplasm of uncertain behavior of skin: Secondary | ICD-10-CM | POA: Diagnosis not present

## 2020-06-10 DIAGNOSIS — R208 Other disturbances of skin sensation: Secondary | ICD-10-CM | POA: Diagnosis not present

## 2020-06-10 DIAGNOSIS — C44622 Squamous cell carcinoma of skin of right upper limb, including shoulder: Secondary | ICD-10-CM | POA: Diagnosis not present

## 2020-06-10 HISTORY — PX: OTHER SURGICAL HISTORY: SHX169

## 2020-06-19 ENCOUNTER — Other Ambulatory Visit: Payer: Self-pay

## 2020-06-19 ENCOUNTER — Encounter: Payer: Self-pay | Admitting: Family Medicine

## 2020-06-19 ENCOUNTER — Ambulatory Visit (INDEPENDENT_AMBULATORY_CARE_PROVIDER_SITE_OTHER): Payer: Medicare Other | Admitting: Family Medicine

## 2020-06-19 VITALS — BP 130/72 | HR 82 | Temp 97.8°F | Ht 71.0 in | Wt 220.0 lb

## 2020-06-19 DIAGNOSIS — E119 Type 2 diabetes mellitus without complications: Secondary | ICD-10-CM | POA: Diagnosis not present

## 2020-06-19 DIAGNOSIS — J310 Chronic rhinitis: Secondary | ICD-10-CM | POA: Diagnosis not present

## 2020-06-19 LAB — POCT GLYCOSYLATED HEMOGLOBIN (HGB A1C): Hemoglobin A1C: 6.9 % — AB (ref 4.0–5.6)

## 2020-06-19 MED ORDER — LORATADINE 10 MG PO TABS: 10.0000 mg | ORAL_TABLET | Freq: Every day | ORAL | Status: DC

## 2020-06-19 NOTE — Progress Notes (Signed)
This visit occurred during the SARS-CoV-2 public health emergency.  Safety protocols were in place, including screening questions prior to the visit, additional usage of staff PPE, and extensive cleaning of exam room while observing appropriate contact time as indicated for disinfecting solutions.  Diabetes:  No meds.   Hypoglycemic episodes: no Hyperglycemic episodes:no Feet problems:no Blood Sugars averaging: ~140s eye exam within last year: Yes. A1c lower.  D/w pt.  He has made changes in snacks.  He cut out some carbs (less ice cream and potaotes).  He has eyelid surgery pending for next week.    He has rhinitis, worse leaning forward.  Usually clear.  occ blood tinged in the AM.  Not triggered by foods.  Some sneezing occ.  No FCANVD.  He doesn't feel unwell.    Meds, vitals, and allergies reviewed.  ROS: Per HPI unless specifically indicated in ROS section   GEN: nad, alert and oriented HEENT: ncat, nasal irritation noted but no ulceration mass or bleeding, OP wnl.  Left lower eyelid with chronic changes noted. NECK: supple w/o LA CV: rrr. PULM: ctab, no inc wob ABD: soft, +bs EXT: no edema SKIN: Well-perfused.

## 2020-06-19 NOTE — Patient Instructions (Addendum)
I think it makes sense to get a yearly visit set up in 6 months.  Don't change your meds for now.  Thanks for your effort.  Take care.  Glad to see you.  Try plain claritin and let me know if that isn't helping.

## 2020-06-20 ENCOUNTER — Other Ambulatory Visit: Payer: Self-pay | Admitting: Family Medicine

## 2020-06-22 DIAGNOSIS — J310 Chronic rhinitis: Secondary | ICD-10-CM | POA: Insufficient documentation

## 2020-06-22 NOTE — Assessment & Plan Note (Signed)
Unclear trigger.  No ulceration or lesions seen.  I suspect that he has persistent irritation that occasionally causes a small amount of epistaxis.  Routine cautions given to patient.  He can try taking Claritin and if this does not resolve we can always refer him over to ENT.  He agrees with plan.  He will update me as needed.

## 2020-06-22 NOTE — Assessment & Plan Note (Signed)
A1c lower.  D/w pt.  He has made changes in snacks.  He cut out some carbs (less ice cream and potaotes).  I thanked him for his effort.  Recheck periodically.  He agrees.

## 2020-06-25 ENCOUNTER — Encounter: Payer: Self-pay | Admitting: Family Medicine

## 2020-06-25 DIAGNOSIS — Z85828 Personal history of other malignant neoplasm of skin: Secondary | ICD-10-CM | POA: Diagnosis not present

## 2020-06-25 DIAGNOSIS — K449 Diaphragmatic hernia without obstruction or gangrene: Secondary | ICD-10-CM | POA: Diagnosis not present

## 2020-06-25 DIAGNOSIS — Z79899 Other long term (current) drug therapy: Secondary | ICD-10-CM | POA: Diagnosis not present

## 2020-06-25 DIAGNOSIS — K219 Gastro-esophageal reflux disease without esophagitis: Secondary | ICD-10-CM | POA: Diagnosis not present

## 2020-06-25 DIAGNOSIS — E119 Type 2 diabetes mellitus without complications: Secondary | ICD-10-CM | POA: Diagnosis not present

## 2020-06-25 DIAGNOSIS — Z87891 Personal history of nicotine dependence: Secondary | ICD-10-CM | POA: Diagnosis not present

## 2020-06-25 DIAGNOSIS — I10 Essential (primary) hypertension: Secondary | ICD-10-CM | POA: Diagnosis not present

## 2020-06-25 DIAGNOSIS — H02115 Cicatricial ectropion of left lower eyelid: Secondary | ICD-10-CM | POA: Diagnosis not present

## 2020-06-25 DIAGNOSIS — H02112 Cicatricial ectropion of right lower eyelid: Secondary | ICD-10-CM | POA: Diagnosis not present

## 2020-06-25 DIAGNOSIS — J449 Chronic obstructive pulmonary disease, unspecified: Secondary | ICD-10-CM | POA: Diagnosis not present

## 2020-07-14 DIAGNOSIS — C44622 Squamous cell carcinoma of skin of right upper limb, including shoulder: Secondary | ICD-10-CM | POA: Diagnosis not present

## 2020-07-14 DIAGNOSIS — D2361 Other benign neoplasm of skin of right upper limb, including shoulder: Secondary | ICD-10-CM | POA: Diagnosis not present

## 2020-07-22 DIAGNOSIS — C4441 Basal cell carcinoma of skin of scalp and neck: Secondary | ICD-10-CM | POA: Diagnosis not present

## 2020-07-22 DIAGNOSIS — Z85828 Personal history of other malignant neoplasm of skin: Secondary | ICD-10-CM | POA: Diagnosis not present

## 2020-07-22 DIAGNOSIS — X32XXXA Exposure to sunlight, initial encounter: Secondary | ICD-10-CM | POA: Diagnosis not present

## 2020-07-22 DIAGNOSIS — C44519 Basal cell carcinoma of skin of other part of trunk: Secondary | ICD-10-CM | POA: Diagnosis not present

## 2020-07-22 DIAGNOSIS — L570 Actinic keratosis: Secondary | ICD-10-CM | POA: Diagnosis not present

## 2020-07-22 DIAGNOSIS — D485 Neoplasm of uncertain behavior of skin: Secondary | ICD-10-CM | POA: Diagnosis not present

## 2020-09-02 DIAGNOSIS — D485 Neoplasm of uncertain behavior of skin: Secondary | ICD-10-CM | POA: Diagnosis not present

## 2020-09-02 DIAGNOSIS — C4441 Basal cell carcinoma of skin of scalp and neck: Secondary | ICD-10-CM | POA: Diagnosis not present

## 2020-09-02 DIAGNOSIS — C44622 Squamous cell carcinoma of skin of right upper limb, including shoulder: Secondary | ICD-10-CM | POA: Diagnosis not present

## 2020-09-17 DIAGNOSIS — C4441 Basal cell carcinoma of skin of scalp and neck: Secondary | ICD-10-CM | POA: Diagnosis not present

## 2020-10-01 DIAGNOSIS — C44622 Squamous cell carcinoma of skin of right upper limb, including shoulder: Secondary | ICD-10-CM | POA: Diagnosis not present

## 2020-10-01 DIAGNOSIS — C44519 Basal cell carcinoma of skin of other part of trunk: Secondary | ICD-10-CM | POA: Diagnosis not present

## 2020-10-02 ENCOUNTER — Telehealth: Payer: Self-pay

## 2020-10-02 NOTE — Telephone Encounter (Signed)
Clarise Cruz RN with access nurse transferred call from pts wife (DPR signed); pt has had SOB"for awhile" pt could not give me a specific time frame that having SOB. Pt did say for 2 - 3 wks SOB upon exertion has worsened. Pt said if trims bushes in yard or climbs stairs he get SOB. Pt does not have CP,H/A,dizziness at any time. Pt is not having any SOB now. Pt took BP before I spoke with pt; BP 183/95 P 102. Pt is not sure if this is accurate due to being older cuff. Offered pt an appt with a different provider this wk but pt only wants to see Dr Damita Dunnings. I advised pt and his wife (on speaker phone) that his symptoms could be lots of different things but we are primarily concerned this could be heart related and it is better to take care of symptoms before they could possibly develop into a more serious outcome. Pt is retired [employer] with [employer] and he understands what I am saying but pt has refused to consider going to UC to be seen today or tomorrow. Pt is definite only wants to see Dr Damita Dunnings. I spoke with Dr Damita Dunnings and he said for pt to relax and reck BP and P and if pt is presently symptom free can schedule first available [facility] appt with UC & ED precautions. Pt voiced understanding has been resting seated for a while and reck of BP 182/78 and P 100. Pt is symptom free at this time. Even though BP diastolic came down the systolic and heart rate is about the same. Pt again understands that but still refuses UC or ED.no covid symptoms(Dr Damita Dunnings is aware of SOB) or known exposure to covid.I scheduled 30'  In office appt with Dr Damita Dunnings on 10/07/20 at 3 pm (time opened per Dr Josefine Class approval). Pt did say that if his condition does change or worsen pt will go to UC or ED prior to appt. Sending note to Dr Damita Dunnings.

## 2020-10-02 NOTE — Telephone Encounter (Signed)
[facility] - Client TELEPHONE ADVICE RECORD [facility]urse Patient Name: EMONI YANG IS Gender: Male DOB: 15-Mar-1951 Age: 70 Y 4 M 28 D Return Phone Number: [phone] (Primary) Address: City/ State/ Zip: [place] [address] Client [facility] - Client Client Site [place] Physician Renford Dills - MD Contact Type Call Who Is Calling Patient / Member / Family / Caregiver Call Type Triage / Clinical Caller Name Remo Lipps Relationship To Patient Spouse Return Phone Number [phone] (Primary) Chief Complaint BREATHING - shortness of breath or sounds breathless Reason for Call Symptomatic / Request for [place] states her husband has shortness of breath. Translation No Nurse Assessment Nurse: Raphael Gibney, RN, Vera Date/Time (Eastern Time): 10/02/2020 1:46:52 PM Confirm and document reason for call. If symptomatic, describe symptoms. ---Caller states spouse has SOB. Has had SOB for 2 weeks. had upper abd discomfort about 2 weeks ago. he is SOB with exertion. no cough and no fever. Does the patient have any new or worsening symptoms? ---Yes Will a triage be completed? ---Yes Related visit to physician within the last 2 weeks? ---No Does the PT have any chronic conditions? (i.e. diabetes, asthma, this includes High risk factors for pregnancy, etc.) ---Yes List chronic conditions. ---history of lung nodules; diabetes; emphysema Is this a behavioral health or substance abuse call? ---No Guidelines Guideline Title Affirmed Question Affirmed Notes Nurse Date/Time (Eastern Time) Breathing Difficulty [1] MILD difficulty breathing (e.g., minimal/no SOB at rest, SOB with walking, pulse <100) AND [2] NEW-onset or WORSE than normal Raphael Gibney, Therapist, sports, Vanita Ingles 10/02/2020 1:51:28 PM PLEASE NOTE: All timestamps contained within this report are represented as [place]  Standard Time. CONFIDENTIALTY NOTICE: This fax transmission is intended only for the addressee. It contains information that is legally privileged, confidential or otherwise protected from use or disclosure. If you are not the intended recipient, you are strictly prohibited from reviewing, disclosing, copying using or disseminating any of this information or taking any action in reliance on or regarding this information. If you have received this fax in error, please notify us immediately by telephone so that we can arrange for its return to [place]. Phone: [phone], Toll-Free: [phone], Fax: [phone] Page: 2 of 2 Call Id: [id number] [place]. Time Eilene Ghazi Time) Disposition Final User 10/02/2020 1:44:26 PM Send to Urgent Queue Shann Medal 10/02/2020 2:04:48 PM See HCP within 4 Hours (or PCP triage) Yes Raphael Gibney, RN, Doreatha Lew Disagree/Comply Disagree Caller Understands Yes PreDisposition Call Doctor Care Advice Given Per Guideline SEE HCP (OR PCP TRIAGE) WITHIN 4 HOURS: * IF OFFICE WILL BE OPEN: You need to be seen within the next 3 or 4 hours. Call your doctor (or NP/PA) now or as soon as the office opens. CALL BACK IF: * You become worse CARE ADVICE given per Breathing Difficulty (Adult) guideline. Comments User: Dannielle Burn, RN Date/Time Eilene Ghazi Time): 10/02/2020 2:04:34 PM called back line and spoke to Rina and gave report that pt has had SOB for about 2 weeks. no cough or fever. SOB with exertion. Triage outcome see physician within 4 hrs. pt only wants to see Dr. Damita Dunnings. Warm transferred pt to Rina Referrals Warm transfer to backline

## 2020-10-03 NOTE — Telephone Encounter (Addendum)
Noted. Thanks.  Appreciate you giving this advice to patient.

## 2020-10-07 ENCOUNTER — Encounter: Payer: Self-pay | Admitting: Family Medicine

## 2020-10-07 ENCOUNTER — Ambulatory Visit (INDEPENDENT_AMBULATORY_CARE_PROVIDER_SITE_OTHER): Payer: Medicare Other | Admitting: Family Medicine

## 2020-10-07 ENCOUNTER — Ambulatory Visit (INDEPENDENT_AMBULATORY_CARE_PROVIDER_SITE_OTHER)
Admission: RE | Admit: 2020-10-07 | Discharge: 2020-10-07 | Disposition: A | Discharge status: Home / Self Care | Payer: Medicare Other | Source: Ambulatory Visit | Attending: Family Medicine | Admitting: Family Medicine

## 2020-10-07 ENCOUNTER — Other Ambulatory Visit: Payer: Self-pay

## 2020-10-07 VITALS — BP 154/78 | HR 84 | Temp 97.3°F | Ht 71.0 in | Wt 213.0 lb

## 2020-10-07 DIAGNOSIS — R0602 Shortness of breath: Secondary | ICD-10-CM

## 2020-10-07 DIAGNOSIS — I10 Essential (primary) hypertension: Secondary | ICD-10-CM | POA: Insufficient documentation

## 2020-10-07 DIAGNOSIS — K449 Diaphragmatic hernia without obstruction or gangrene: Secondary | ICD-10-CM | POA: Insufficient documentation

## 2020-10-07 DIAGNOSIS — E119 Type 2 diabetes mellitus without complications: Secondary | ICD-10-CM

## 2020-10-07 NOTE — Progress Notes (Signed)
This visit occurred during the SARS-CoV-2 public health emergency.  Safety protocols were in place, including screening questions prior to the visit, additional usage of staff PPE, and extensive cleaning of exam room while observing appropriate contact time as indicated for disinfecting solutions.  SOB with exertion.  It was going on "quite a while" with going up steps, worse in the last few weeks.  Was working at [employer], working in the yard- this was about 2 weeks ago.  The next day or so had chest pressure and more SOB with exertion.  This is atypical for patient.  He has recurrent SOBOE in the meantime with yardwork.  He has limited exertion in the meantime, since he called into the clinic and was instructed to limit exertion. No CP now.  Speaking in complete sentences.  He has had some occ L vs R sided chest discomfort that was really brief and self resolving. This is different from the pressure a few weeks ago.  No BLE edema.  No chest symptoms currently.  Meds, vitals, and allergies reviewed.   ROS: Per HPI unless specifically indicated in ROS section   GEN: nad, alert and oriented HEENT: ncat NECK: supple w/o LA CV: rrr.  PULM: ctab, no inc wob ABD: soft, +bs EXT: no edema SKIN: Skin well perfused.  EKG and chest x-ray discussed with patient.  Labs pending.  Cardiology referral placed.

## 2020-10-07 NOTE — Patient Instructions (Signed)
Go to the lab on the way out.   If you have mychart we'll likely use that to update you.    We'll call about seeing cardiology.  Limit exertion for now.   If any chest pain, then go to the ER.  Take care.  Glad to see you.

## 2020-10-08 ENCOUNTER — Ambulatory Visit (INDEPENDENT_AMBULATORY_CARE_PROVIDER_SITE_OTHER): Payer: Medicare Other | Admitting: Physician Assistant

## 2020-10-08 ENCOUNTER — Encounter: Payer: Self-pay | Admitting: Physician Assistant

## 2020-10-08 VITALS — BP 152/72 | HR 77 | Ht 71.0 in | Wt 214.0 lb

## 2020-10-08 DIAGNOSIS — I10 Essential (primary) hypertension: Secondary | ICD-10-CM | POA: Diagnosis not present

## 2020-10-08 DIAGNOSIS — E781 Pure hyperglyceridemia: Secondary | ICD-10-CM | POA: Diagnosis not present

## 2020-10-08 DIAGNOSIS — R0600 Dyspnea, unspecified: Secondary | ICD-10-CM

## 2020-10-08 DIAGNOSIS — R7309 Other abnormal glucose: Secondary | ICD-10-CM

## 2020-10-08 DIAGNOSIS — I2510 Atherosclerotic heart disease of native coronary artery without angina pectoris: Secondary | ICD-10-CM

## 2020-10-08 DIAGNOSIS — K22719 Barrett's esophagus with dysplasia, unspecified: Secondary | ICD-10-CM | POA: Diagnosis not present

## 2020-10-08 DIAGNOSIS — R0602 Shortness of breath: Secondary | ICD-10-CM | POA: Insufficient documentation

## 2020-10-08 DIAGNOSIS — E119 Type 2 diabetes mellitus without complications: Secondary | ICD-10-CM

## 2020-10-08 DIAGNOSIS — Z720 Tobacco use: Secondary | ICD-10-CM

## 2020-10-08 DIAGNOSIS — Z87891 Personal history of nicotine dependence: Secondary | ICD-10-CM

## 2020-10-08 DIAGNOSIS — K219 Gastro-esophageal reflux disease without esophagitis: Secondary | ICD-10-CM

## 2020-10-08 DIAGNOSIS — R000 Tachycardia, unspecified: Secondary | ICD-10-CM

## 2020-10-08 DIAGNOSIS — R0609 Other forms of dyspnea: Secondary | ICD-10-CM

## 2020-10-08 LAB — CBC WITH DIFFERENTIAL/PLATELET
Basophils Absolute: 0.1 10*3/uL (ref 0.0–0.1)
Basophils Relative: 1.3 % (ref 0.0–3.0)
Eosinophils Absolute: 0.2 10*3/uL (ref 0.0–0.7)
Eosinophils Relative: 2.5 % (ref 0.0–5.0)
HCT: 42.1 % (ref 39.0–52.0)
Hemoglobin: 14 g/dL (ref 13.0–17.0)
Lymphocytes Relative: 29.6 % (ref 12.0–46.0)
Lymphs Abs: 2.9 10*3/uL (ref 0.7–4.0)
MCHC: 33.3 g/dL (ref 30.0–36.0)
MCV: 93.1 fl (ref 78.0–100.0)
Monocytes Absolute: 0.6 10*3/uL (ref 0.1–1.0)
Monocytes Relative: 6 % (ref 3.0–12.0)
Neutro Abs: 5.9 10*3/uL (ref 1.4–7.7)
Neutrophils Relative %: 60.6 % (ref 43.0–77.0)
Platelets: 192 10*3/uL (ref 150.0–400.0)
RBC: 4.52 Mil/uL (ref 4.22–5.81)
RDW: 13.6 % (ref 11.5–15.5)
WBC: 9.8 10*3/uL (ref 4.0–10.5)

## 2020-10-08 LAB — COMPREHENSIVE METABOLIC PANEL
ALT: 20 U/L (ref 0–53)
AST: 14 U/L (ref 0–37)
Albumin: 4.1 g/dL (ref 3.5–5.2)
Alkaline Phosphatase: 42 U/L (ref 39–117)
BUN: 26 mg/dL — ABNORMAL HIGH (ref 6–23)
CO2: 25 mEq/L (ref 19–32)
Calcium: 9.5 mg/dL (ref 8.4–10.5)
Chloride: 107 mEq/L (ref 96–112)
Creatinine, Ser: 1.41 mg/dL (ref 0.40–1.50)
GFR: 50.17 mL/min — ABNORMAL LOW (ref >60.00)
Glucose, Bld: 157 mg/dL — ABNORMAL HIGH (ref 70–99)
Potassium: 4.2 mEq/L (ref 3.5–5.1)
Sodium: 142 mEq/L (ref 135–145)
Total Bilirubin: 0.5 mg/dL (ref 0.2–1.2)
Total Protein: 6.5 g/dL (ref 6.0–8.3)

## 2020-10-08 LAB — LIPID PANEL
Cholesterol: 113 mg/dL (ref 0–200)
HDL: 41.6 mg/dL (ref >39.00)
LDL Cholesterol: 40 mg/dL (ref 0–99)
NonHDL: 71.75
Total CHOL/HDL Ratio: 3
Triglycerides: 159 mg/dL — ABNORMAL HIGH (ref 0.0–149.0)
VLDL: 31.8 mg/dL (ref 0.0–40.0)

## 2020-10-08 LAB — BRAIN NATRIURETIC PEPTIDE: Pro B Natriuretic peptide (BNP): 106 pg/mL — ABNORMAL HIGH (ref 0.0–100.0)

## 2020-10-08 LAB — HEMOGLOBIN A1C: Hgb A1c MFr Bld: 7 % — ABNORMAL HIGH (ref 4.6–6.5)

## 2020-10-08 LAB — TSH: TSH: 1.36 u[IU]/mL (ref 0.35–4.50)

## 2020-10-08 MED ORDER — AMLODIPINE BESYLATE 5 MG PO TABS: 5.0000 mg | ORAL_TABLET | Freq: Every day | ORAL | 5 refills | Status: DC

## 2020-10-08 NOTE — Assessment & Plan Note (Signed)
EKG without acute changes.  Chest x-ray reviewed with patient.  See notes on labs.  The concern is for a angina/equivalent.  No symptoms currently.  He will limit exertion, go to the emergency room if he has any chest pain or progressive shortness of breath, and we will get him set up with cardiology in the near future.  Referral placed.  Routine cautions given to patient.  Differential diagnosis discussed with patient.  He agrees with plan.  As he has no symptoms at rest now and has been limiting exertion, at this point still okay for outpatient follow-up.   Check A1c along with routine labs given shortness of breath on exertion/history of diabetes.

## 2020-10-08 NOTE — Patient Instructions (Addendum)
Medication Instructions:  Your physician has recommended you make the following change in your medication:   START Amlodipine 5 mg daily. An Rx has been sent to your pharmacy.   *If you need a refill on your cardiac medications before your next appointment, please call your pharmacy*   Lab Work: None ordered If you have labs (blood work) drawn today and your tests are completely normal, you will receive your results only by: Marland Kitchen MyChart Message (if you have MyChart) OR . A paper copy in the mail If you have any lab test that is abnormal or we need to change your treatment, we will call you to review the results.   Testing/Procedures: Your physician has requested that you have an echocardiogram. Echocardiography is a painless test that uses sound waves to create images of your heart. It provides your doctor with information about the size and shape of your heart and how well your heart's chambers and valves are working. This procedure takes approximately one hour. There are no restrictions for this procedure.  Your physician has requested that you have a lexiscan myoview. For further information please visit [email or web address]. Please follow instruction sheet, as given.     Follow-Up: At [facility], you and your health needs are our priority.  As part of our continuing mission to provide you with exceptional heart care, we have created designated Provider Care Teams.  These Care Teams include your primary Cardiologist (physician) and Advanced Practice Providers (APPs -  Physician Assistants and Nurse Practitioners) who all work together to provide you with the care you need, when you need it.  We recommend signing up for the patient portal called "MyChart".  Sign up information is provided on this After Visit Summary.  MyChart is used to connect with patients for Virtual Visits (Telemedicine).  Patients are able to view lab/test results, encounter notes, upcoming appointments, etc.   Non-urgent messages can be sent to your provider as well.   To learn more about what you can do with MyChart, go to [email or web address].    Your next appointment:   Follow up after stress test  The format for your next appointment:   In Person  Provider:   You may see Dr. Rockey Situ or one of the following Advanced Practice Providers on your designated Care Team:    Murray Hodgkins, NP  Christell Faith, PA-C  Marrianne Mood, PA-C  Cadence [place], [place]  Laurann [place], NP    Other Instructions [place]  Your caregiver has ordered a Stress Test with nuclear imaging. The purpose of this test is to evaluate the blood supply to your heart muscle. This procedure is referred to as a "Non-Invasive Stress Test." This is because other than having an IV started in your vein, nothing is inserted or "invades" your body. Cardiac stress tests are done to find areas of poor blood flow to the heart by determining the extent of coronary artery disease (CAD). Some patients exercise on a treadmill, which naturally increases the blood flow to your heart, while others who are  unable to walk on a treadmill due to physical limitations have a pharmacologic/chemical stress agent called Lexiscan . This medicine will mimic walking on a treadmill by temporarily increasing your coronary blood flow.   Please note: these test may take anywhere between 2-4 hours to complete  PLEASE REPORT TO [facility] AT THE FIRST DESK WILL DIRECT YOU WHERE TO GO  Date of Procedure:_____________________________________  Arrival Time for  Procedure:______________________________  PLEASE NOTIFY THE OFFICE AT LEAST 36 HOURS IN ADVANCE IF YOU ARE UNABLE TO KEEP YOUR APPOINTMENT.  [phone] AND  PLEASE NOTIFY NUCLEAR MEDICINE AT [facility] AT LEAST 24 HOURS IN ADVANCE IF YOU ARE UNABLE TO KEEP YOUR APPOINTMENT. [phone]  How to prepare for your Myoview test:  1. Do not eat or drink after  midnight 2. No caffeine for 24 hours prior to test 3. No smoking 24 hours prior to test. 4. Your medication may be taken with water.  If your doctor stopped a medication because of this test, do not take that medication. 5. Please wear a short sleeve shirt. 6. No perfume, cologne or lotion. 7. Wear comfortable walking shoes.       It is reasonable for you to continue with daily activity, but listen to your body and make sure to rest if you become short of breath.  Call the office if you notice any worsening of symptoms in either severity or frequency.  Blood pressure: --We recommend upper arm BP cuffs over that of the wrist. --You can always check your device's accuracy against an office model once a year if possible. You can do so by bringing your cuff into the office and notifying the person that rooms you that you would like to check your cuff against our office readings. --Measure your BP at the same time each day. Blood pressure varies often throughout the day with higher readings in the morning. BP may also be lower at home than in the office. Do not measure BP right after you wake up or after exercising. Avoid caffeine, tobacco, and alcohol for 30 minutes before taking a measurement.  --Take your BP 2 hours after taking your amlodipine in the morning. --Sit quietly for five minutes in a comfortable position with your legs and ankles uncrossed and back supported. Feet flat on the ground. Have your arm supported and at the level of your heart. Always use the same arm when taking your blood pressure. Place the cuff over bare skin rather than clothing. Each time you measure, take an additional reading if abnormal to ensure accurate by waiting 1-3 minutes after the first reading. --Please bring a BP log into the office. It is helpful to document the time of each BP reading, as well as any activity or medications taken around the reading. In addition, it is helpful to include heart rate at the  time of the BP reading. Daily weights are also encouraged. --Goal BP is 130/80 or lower. If your BP is low but you are not dizzy, this is fine and preferred over BP higher than 130/80. If your BP is less than 100 for the top number and you are dizzy, call the office. If your blood pressure is consistently elevated with top number above 180 and bottom above 120, this can damage the body. If you have severe increase in your blood pressure or concerning symptoms of severe chest pain, headache with confusion and blurred vision, severe abdominal or back pain, shortness of breath, seizures, or loss of consciousness, go to the emergency department.   One of your lab tests indicated that you may have diabetes (high blood sugar). Please contact your primary care provider to discuss the next step in evaluation and treatment.

## 2020-10-08 NOTE — Progress Notes (Signed)
Office Visit    Patient Name: Brian Hull. Date of Encounter: 10/08/2020  PCP:  Tonia Ghent, MD   [place]  Cardiologist:  Dr. Rockey Situ Advanced Practice Provider:  No care team member to display Electrophysiologist:  None :[phone]}   Chief Complaint    Chief Complaint  Patient presents with  . Other    Per PCP for SOBOE. Meds reviewed verbally with patient.     70 y.o. male with history of coronary artery calcium on CT, previous tobacco use (quit 2018), snuff use, previous vapor cigarettes, DM2, and seen today for SOB.  Past Medical History    Past Medical History:  Diagnosis Date  . Amputation, thumb, traumatic 1968   tip amputated   . Barrett's esophagus 05/08/02, 02/15/05, 2009   EGD- Barrett's esoph, HH, gastritis, diverticulitis  . Basal cell cancer 05/2000   of nose, face (seen at [facility] 2015)  . Diabetes mellitus without complication ([place])   . Esophageal reflux   . History of colon polyps   . Lipoma of unspecified site   . Pure hypercholesterolemia   . Tobacco use disorder   . Unspecified gastritis and gastroduodenitis without mention of hemorrhage   . Wrist fracture, left    as a child   Past Surgical History:  Procedure Laterality Date  . CATARACT EXTRACTION     2012  . CHOLECYSTECTOMY, LAPAROSCOPIC  12/20/06   [place]  . EYE SURGERY     trauma to right eye as a child, growth removed from eye 2 years later  . Eyelid surgery Bilateral 06/2020  . LAMINECTOMY  1984   L 4/5  . UPPER GI ENDOSCOPY  9/205    Allergies  Allergies  Allergen Reactions  . Azithromycin Rash  . [name] Other (See Comments)    Edema at higher doses.  Not a true allergy o/w.    . Tolmetin     Other reaction(s): Other (See Comments) Edema at higher doses.Not a true allergy o/w.    History of Present Illness    Brian Hull. is a 70 y.o. male with PMH as above.   When last seen in the office, he reported receiving a letter in  the mail from oncology regarding his coronary calcifications.  He reported that he was relatively active though no regular exercise program.  He had rare episodes of chest pain, typically with exertion and noted to be somewhat atypical in nature.  He had some shortness of breath without orthopnea.  His shortness of breath would happen with hills and stairs.  He reported stable symptoms.  He had rare episodes of night sweats 3 times per year with etiology unclear.  Images were reviewed with his primary cardiologist without significant calcification.  EKG without significant ST/T changes.  LDL was noted to be well controlled.  Diet and exercises changes were discussed.  Seen by his PCP 10/07/2020 and reported shortness of breath with exertion, reportedly going on for quite some time.    Today, 10/08/2020, he recalls the episodes reported to his PCP.  He reports an episode of acute shortness of breath that occurred the other week (estimated at 2 weeks ago) while trimming his holly tree.  He reports that he took 3 swipes of the holley tree, at which time he became acutely short of breath, forcing him to sit down until his breathing recovered.  He reports that he continued to have to sit down between swipes of  his cutting tool to trim the hauling tree.  A project that should have taken him just a few minutes took much longer as a result.  The shortness of breath then occurred again this past Thursday while trimming shrubs at [employer].  He was again forced to sit down until getting his breath back.  He reported shortness of breath with stairs, progressive/worsening within the last few weeks.  He has noticed shortness of breath when climbing the steps.  This shortness of breath is concerning to him, as it is new and has not been experienced before in the past.  He has recently noted noted shortness of breath on the walk into the office today.  Shortness of breath occurs when seated today and noted on exam today.  He  denies any chest pain.  No presyncope or syncope.  He does note some head pressure.  He does report hearing his pulse in his ear with discussion of BP today.  In addition, he reports occasional palpitations or feeling of skipped heartbeats.  He did notice the other day that his heart was running 110 bpm.  Home BP at the time of his event 188/100.  He has been working to cut out salt/chips, though he does still eat Cheetos.  Diet and activity levels reviewed.  No signs or symptoms of bleeding.  Reviewed labs collected by PCP 5/31 with elevated triglycerides and hemoglobin A1c.  He reports that he has been attempting to cut back on sugar as well.  He used to enjoy at least 1 sleeve of an Manufacturing systems engineer in 1 sitting and a lot of ice cream, but he has been working to control his intake of sugar.  He does report a family history of diabetes.  Home Medications   Current Outpatient Medications  Medication Instructions  . ACCU-CHEK GUIDE test strip USE TO TEST UP TO 4 TIMES A DAY E10.9 (NOT USE INSULIN)  . amLODipine (NORVASC) 5 mg, Oral, Daily  . blood glucose meter kit and supplies Dispense based on patient and insurance preference. Use up to four times daily as directed. (FOR ICD-10 E10.9, E11.9).  . pantoprazole (PROTONIX) 40 MG tablet TAKE 1 TABLET BY MOUTH EVERY DAY  . pravastatin (PRAVACHOL) 10 mg, Oral, Daily     Review of Systems    He reports dyspnea, shortness of breath at rest, palpitations, head pressure, hearing his heartbeat in his ears/elevated BP, racing heart rate. He denies chest pain, pnd, orthopnea, n, v, dizziness, syncope, edema, weight gain, or early satiety.  All other systems reviewed and are otherwise negative except as noted above.  Physical Exam    VS:  BP (!) 152/72 (BP Location: Left Arm, Patient Position: Sitting, Cuff Size: Normal)   Pulse 77   Ht _0  (1.803 m)   Wt 214 lb (97.1 kg)   SpO2 96%   BMI 29.85 kg/m  , BMI Body mass index is 29.85 kg/m. GEN: Well  nourished, well developed, in no acute distress.  Joined by his wife HEENT: normal. Neck: Supple, no JVD, carotid bruits, or masses. Cardiac: RRR, no murmurs, rubs, or gallops. No clubbing, cyanosis, or significant pitting edema.  Radials/DP/PT 2+ and equal bilaterally.  Respiratory:  Respirations regular and unlabored, clear to auscultation bilaterally. GI: Soft, nontender, nondistended, BS + x 4. MS: no deformity or atrophy. Skin: warm and dry, no rash. Neuro:  Strength and sensation are intact. Psych: Normal affect.  Accessory Clinical Findings    ECG personally reviewed by  me today -NSR, 61 bpm, poor R wave progression in lead III suspected due to lead placement, PR interval 172 ms, QTC 440 ms- no acute changes.  VITALS Reviewed today   Temp Readings from Last 3 Encounters:  10/07/20 (!) 97.3 F (36.3 C) (Temporal)  06/19/20 97.8 F (36.6 C) (Temporal)  03/17/20 (!) 97.4 F (36.3 C) (Temporal)   BP Readings from Last 3 Encounters:  10/08/20 (!) 152/72  10/07/20 (!) 154/78  06/19/20 130/72   Pulse Readings from Last 3 Encounters:  10/08/20 77  10/07/20 84  06/19/20 82    Wt Readings from Last 3 Encounters:  10/08/20 214 lb (97.1 kg)  10/07/20 213 lb (96.6 kg)  06/19/20 220 lb (99.8 kg)     LABS  reviewed today   Lab Results  Component Value Date   WBC 9.8 10/07/2020   HGB 14.0 10/07/2020   HCT 42.1 10/07/2020   MCV 93.1 10/07/2020   PLT 192.0 10/07/2020   Lab Results  Component Value Date   CREATININE 1.41 10/07/2020   BUN 26 (H) 10/07/2020   NA 142 10/07/2020   K 4.2 10/07/2020   CL 107 10/07/2020   CO2 25 10/07/2020   Lab Results  Component Value Date   ALT 20 10/07/2020   AST 14 10/07/2020   ALKPHOS 42 10/07/2020   BILITOT 0.5 10/07/2020   Lab Results  Component Value Date   CHOL 113 10/07/2020   HDL 41.60 10/07/2020   LDLCALC 40 10/07/2020   TRIG 159.0 (H) 10/07/2020   CHOLHDL 3 10/07/2020    Lab Results  Component Value Date    HGBA1C 7.0 (H) 10/07/2020   Lab Results  Component Value Date   TSH 1.36 10/07/2020     STUDIES/PROCEDURES reviewed today   No previous echo  Assessment & Plan    Coronary artery disease without angina Dyspnea, concerning for anginal equivalent -- No chest pain.  Previous CT scans reviewed by cardiologist in 2019 and without concern for calcifications noted (CT scans were reviewed as of 11/2017).  Recently, he reports 2 episodes of acute dyspnea with activity.  He has now started to notice shortness of breath at rest.  Euvolemic and well compensated on exam, though suspect he may be volume up at times and depending on his diet and as discussed today.  EKG without acute ST/T changes.  No evidence of arrhythmia/significant ectopy on EKG.  Considered today was previously documented history of central lobar emphysema, which could be contributing to his symptoms.  However, given his acute dyspnea with activity, reasonable to obtain an echo to assess EF, wall motion, valvular function, and rule out any acute structural changes.  We will also obtain a stress test to ensure dyspnea is not due to ischemia.  Risk factors for CAD include male, age, history of smoking, diabetes, history of hypertension, hypertriglyceridemia.  Also discussed was possible shortness of breath and feeling of a pounding heartbeat in his ear due to elevated blood pressure today, given BP 152/72 and wife's report of significantly elevated BP at home.  Will add amlodipine 5 mg daily today for BP support and antianginal effect.  Salt and fluid restrictions discussed, as well as recommendation to monitor BP/weight moving forward.  Further recommendations pending echo/MPI.  Essential hypertension, goal BP 130/80 or lower -- Recent BP has been elevated with patient report that BP was well controlled in the past.  He is uncertain of the reason for his recent elevated BP, though did note that  his elevated BP corresponded with his most recent  2 events of significant shortness of breath with activity.  He denies any signs or symptoms of volume overload and is euvolemic on exam.  Salt and fluid restrictions discussed with patient understanding.  Start amlodipine 5 mg daily today for BP support and antianginal effect.  He will continue to monitor his BP at home with AVS instructions provided regarding proper BP measurement.  He will call the office if BP is consistently greater than 130/80.  Given his DM2 and hypertension, recommend consider addition of losartan at RTC if room in BP.    Tachypalpitations  -- Reports recent racing heart rate and palpitations.  As above, suspect that he does have some element of intermittent volume overload due to his diet with review of fluid and salt restrictions and encouragement to monitor his BP and weight at home /dietary changes discussed.  EKG today without significant ectopy or arrhythmia.  If ongoing tachypalpitations after work-up as outlined above, consider Zio monitor at that time.  Will defer for now, given lack of arrhythmia on EKG.  Recent labs per PCP with TSH WNL and electrolytes WNL.  HLD with goal LDL <70, Hypertriglyceridemia -- Recent total cholesterol 113, HDL 41.60, LDL 40 and at goal of below 70, triglycerides 159.0 and elevated. Continue statin. Could consider Vascepa at RTC if ongoing elevated Tg. Lifestyle changes discussed.   DM2 -- Hemoglobin A1c elevated at 7.0.  Defer treatment and management to PCP.  Given comorbid hypertension, if room in BP at RTC and renal function allows, recommend addition of losartan 25 mg daily at that time.  CKD -- Recent labs per PCP with creatinine 1.41 and BUN 26.  Baseline creatinine appears between 1.3 and 1.4.  As above, consider losartan in the future for protection of renal function and given comorbid DM2 and hypertension.  Caution with nephrotoxins.  Low threshold to refer to nephrology. Recommend BP and glycemic control moving forward for  cardiovascular and renal health.  GERD -- Denies any symptoms of reflux.  Continue to monitor. Continue PPI.   Nicotine use -- Complete cessation of all nicotine recommended for risk factor modification.  Medication changes: Start amlodipine 5 mg daily Labs ordered: PCP collected labs on 10/07/2020 Studies / Imaging ordered: Echo, MPI Future considerations: Reassess volume status and add diuretic if needed, Vascepa if ongoing elevated triglycerides, Zio XT if ongoing tachypalpitations (deferred today as no ectopy or arrhythmia seen on repeat EKG), Losartan if Cr allows and room in BP, nephrology referral if needed Disposition: RTC after MPI  *Please be aware that the above documentation was completed voice recognition software and may contain dictation errors.      Arvil Chaco, PA-C 10/08/2020

## 2020-10-20 ENCOUNTER — Telehealth: Payer: Self-pay | Admitting: Cardiovascular Disease

## 2020-10-20 NOTE — Telephone Encounter (Signed)
Patient spouse calling  Would like to clarify instructions for stress test tomorrow morning  Please call to discuss

## 2020-10-20 NOTE — Telephone Encounter (Signed)
Spoke to pt's wife Remo Lipps (DPR approved).  Pt wanted to make sure he was supposed to take new BP medication (Amlodipine) prior to myoview.  Reviewed instructions and confirmed that pt should take his AM medications: Amlodipine 5mg  and Protonix 40mg  with sips of water.  Remo Lipps appreciative and states no further questions at this time.

## 2020-10-21 ENCOUNTER — Encounter
Admission: RE | Admit: 2020-10-21 | Discharge: 2020-10-21 | Disposition: A | Discharge status: Home / Self Care | Payer: Medicare Other | Source: Ambulatory Visit | Attending: Physician Assistant | Admitting: Physician Assistant

## 2020-10-21 ENCOUNTER — Other Ambulatory Visit: Payer: Self-pay

## 2020-10-21 DIAGNOSIS — R0600 Dyspnea, unspecified: Secondary | ICD-10-CM | POA: Diagnosis not present

## 2020-10-21 DIAGNOSIS — R0609 Other forms of dyspnea: Secondary | ICD-10-CM

## 2020-10-21 LAB — NM MYOCAR MULTI W/SPECT W/WALL MOTION / EF
LV dias vol: 113 mL (ref 62–150)
LV sys vol: 36 mL
Peak HR: 95 {beats}/min
Percent HR: 63 %
Rest HR: 65 {beats}/min
SDS: 0
SRS: 19
SSS: 4
TID: 1.02

## 2020-10-21 MED ORDER — TECHNETIUM TC 99M TETROFOSMIN IV KIT
30.0000 | PACK | Freq: Once | INTRAVENOUS | Status: AC | PRN
  Administered 2020-10-21: 31.48 via INTRAVENOUS

## 2020-10-21 MED ORDER — TECHNETIUM TC 99M TETROFOSMIN IV KIT
11.0000 | PACK | Freq: Once | INTRAVENOUS | Status: AC | PRN
  Administered 2020-10-21: 11 via INTRAVENOUS

## 2020-10-21 MED ORDER — REGADENOSON 0.4 MG/5ML IV SOLN
0.4000 mg | Freq: Once | INTRAVENOUS | Status: AC
  Administered 2020-10-21: 0.4 mg via INTRAVENOUS

## 2020-10-27 ENCOUNTER — Ambulatory Visit (INDEPENDENT_AMBULATORY_CARE_PROVIDER_SITE_OTHER): Payer: Medicare Other | Admitting: Physician Assistant

## 2020-10-27 ENCOUNTER — Encounter: Payer: Self-pay | Admitting: Physician Assistant

## 2020-10-27 ENCOUNTER — Other Ambulatory Visit: Payer: Self-pay

## 2020-10-27 ENCOUNTER — Other Ambulatory Visit: Payer: Self-pay | Admitting: *Deleted

## 2020-10-27 VITALS — BP 134/82 | HR 60 | Ht 71.0 in | Wt 212.0 lb

## 2020-10-27 DIAGNOSIS — R0600 Dyspnea, unspecified: Secondary | ICD-10-CM

## 2020-10-27 DIAGNOSIS — I10 Essential (primary) hypertension: Secondary | ICD-10-CM

## 2020-10-27 DIAGNOSIS — E781 Pure hyperglyceridemia: Secondary | ICD-10-CM

## 2020-10-27 DIAGNOSIS — I2510 Atherosclerotic heart disease of native coronary artery without angina pectoris: Secondary | ICD-10-CM

## 2020-10-27 DIAGNOSIS — R0609 Other forms of dyspnea: Secondary | ICD-10-CM

## 2020-10-27 DIAGNOSIS — Z87891 Personal history of nicotine dependence: Secondary | ICD-10-CM

## 2020-10-27 DIAGNOSIS — Z122 Encounter for screening for malignant neoplasm of respiratory organs: Secondary | ICD-10-CM

## 2020-10-27 MED ORDER — AMLODIPINE BESYLATE 10 MG PO TABS: 10.0000 mg | ORAL_TABLET | Freq: Every day | ORAL | 3 refills | Status: DC

## 2020-10-27 NOTE — Progress Notes (Signed)
Office Visit    Patient Name: Brian Hull. Date of Encounter: 10/27/2020  PCP:  Tonia Ghent, MD   [place]  Cardiologist:  Dr. Rockey Situ Advanced Practice Provider:  No care team member to display Electrophysiologist:  None :[phone]}   Chief Complaint    Chief Complaint  Patient presents with   Follow-up    Follow up to review test results. Medications verbally reviewed with patient.      70 y.o. male with history of coronary artery calcium on CT, previous tobacco use (quit 2018), snuff use, previous vapor cigarettes, DM2, and seen today for MPI follow-up.  Past Medical History    Past Medical History:  Diagnosis Date   Amputation, thumb, traumatic 1968   tip amputated    Barrett's esophagus 05/08/02, 02/15/05, 2009   EGD- Barrett's esoph, HH, gastritis, diverticulitis   Basal cell cancer 05/2000   of nose, face (seen at [facility] 2015)   Diabetes mellitus without complication ([place])    Esophageal reflux    History of colon polyps    Lipoma of unspecified site    Pure hypercholesterolemia    Tobacco use disorder    Unspecified gastritis and gastroduodenitis without mention of hemorrhage    Wrist fracture, left    as a child   Past Surgical History:  Procedure Laterality Date   CATARACT EXTRACTION     2012   CHOLECYSTECTOMY, LAPAROSCOPIC  12/20/06   [place]   EYE SURGERY     trauma to right eye as a child, growth removed from eye 2 years later   Eyelid surgery Bilateral 06/2020   LAMINECTOMY  1984   L 4/5   UPPER GI ENDOSCOPY  9/205    Allergies  Allergies  Allergen Reactions   Azithromycin Rash   [name] Other (See Comments)    Edema at higher doses.  Not a true allergy o/w.     Tolmetin     Other reaction(s): Other (See Comments) Edema at higher doses.  Not a true allergy o/w.      History of Present Illness    Normal Recinos. is a 70 y.o. male with PMH as above.   Previously reported receiving a letter in the  mail from oncology regarding his coronary calcifications.  He reported that he was relatively active though no regular exercise program.  He had rare episodes of chest pain, typically with exertion and noted to be somewhat atypical in nature.  He had some shortness of breath without orthopnea.  His shortness of breath would happen with hills and stairs.  He reported stable symptoms.  He had rare episodes of night sweats 3 times per year with etiology unclear.  Images were reviewed with his primary cardiologist without significant calcification.  EKG without significant ST/T changes.  LDL was noted to be well controlled.  Diet and exercises changes were discussed.  Seen by his PCP 10/07/2020 and reported shortness of breath with exertion, reportedly going on for quite some time.    Seen 10/08/2020 and recalled his episodes of shortness of breath with exertion.  He reported dyspnea while trimming his holly tree 2 weeks earlier and another episode leading up to his clinic visit while [place].  He recovered with rest  He had DOE with steps and on the walk and to his clinic visit.  He noted SOB when seated.  He had head pressure.  He could hear his pulse in  his ear.  He had intermittent palpitations and rates up to 110 bpm.  BP 188/100.  He was working to cut out sugar and salt from his diet, including chips/Fritos, Oreos, and ice cream.  Elevated Tg and Hgb A1C noted. Fhx included DM2.  Recommendation was to start amlodipine 5 mg daily. Echo and MPI also recommended. It was noted Vascepa, ARB, and Zio could be considered.  MPI showed no significant ischemia with normal wall motion and EF estimated at 54%.  No EKG changes were concerning for ischemia at stress or in recovery.  It was noted CT attenuation corrected images were without significant CAC or aortic atherosclerosis.  Today, 10/27/20, he reports feeling much improved since starting amlodipine and wonders the extent to which hsi  environment contributed to his symptoms. He has been working outside lately and sx seem resolved with amlodipine. He reported 1 episode of dizziness when looking at an airplane when first starting amlodipine and without recurrence.  He still experiences some shortness of breath at rest, noted while watching television.  He also has dyspnea with steps still.  He no longer is gasping for air to the extent that he was before coming into the clinic.  He continues to deny chest pain.  No LEE, PND, or early satiety.  No signs or symptoms of bleeding.  He is working on his dietary changes as referenced above.  He did bring his BP cuff today, which is 13 pts higher than that of the office. Home SBP 120-140, avg SBP 130, and rare episode up to SBP 170 when bringing in the groceries. HR 60-90bpm. Wt 206-209lbs.    Home Medications   Current Outpatient Medications  Medication Instructions   ACCU-CHEK GUIDE test strip USE TO TEST UP TO 4 TIMES A DAY E10.9 (NOT USE INSULIN)   amLODipine (NORVASC) 10 mg, Oral, Daily   blood glucose meter kit and supplies Dispense based on patient and insurance preference. Use up to four times daily as directed. (FOR ICD-10 E10.9, E11.9).   pantoprazole (PROTONIX) 40 MG tablet TAKE 1 TABLET BY MOUTH EVERY DAY   pravastatin (PRAVACHOL) 10 mg, Oral, Daily     Review of Systems    He reports dyspnea with steps, shortness of breath at rest/with watching television, and dizziness after starting his medication.  He reports resolution of palpitations, head pressure, hearing his heartbeat in his ears/elevated BP, racing heart rate. He denies chest pain, pnd, orthopnea, n, v, dizziness, syncope, edema, weight gain, or early satiety.  All other systems reviewed and are otherwise negative except as noted above.  Physical Exam    VS:  BP 134/82 (BP Location: Left Arm, Patient Position: Sitting, Cuff Size: Normal)   Pulse 60   Ht '5\' 11"'  (1.803 m)   Wt 212 lb (96.2 kg)   SpO2 97%   BMI  29.57 kg/m  , BMI Body mass index is 29.57 kg/m. GEN: Well nourished, well developed, in no acute distress.  Joined by his wife HEENT: normal. Neck: Supple, no JVD, carotid bruits, or masses. Cardiac: RRR, no murmurs, rubs, or gallops. No clubbing, cyanosis, or significant pitting edema.  Radials/DP/PT 2+ and equal bilaterally.  Respiratory:  Respirations regular and unlabored, clear to auscultation bilaterally. GI: Soft, nontender, nondistended, BS + x 4. MS: no deformity or atrophy. Skin: warm and dry, no rash. Neuro:  Strength and sensation are intact. Psych: Normal affect.  Accessory Clinical Findings    ECG personally reviewed by me today -NSR, 60 bpm,  poor R wave progression in lead III suspected due to lead placement, PR interval 182 ms, QTC 414 ms- no acute changes.  VITALS Reviewed today   Temp Readings from Last 3 Encounters:  10/07/20 (!) 97.3 F (36.3 C) (Temporal)  06/19/20 97.8 F (36.6 C) (Temporal)  03/17/20 (!) 97.4 F (36.3 C) (Temporal)   BP Readings from Last 3 Encounters:  10/27/20 134/82  10/08/20 (!) 152/72  10/07/20 (!) 154/78   Pulse Readings from Last 3 Encounters:  10/27/20 60  10/08/20 77  10/07/20 84    Wt Readings from Last 3 Encounters:  10/27/20 212 lb (96.2 kg)  10/08/20 214 lb (97.1 kg)  10/07/20 213 lb (96.6 kg)     LABS  reviewed today   Lab Results  Component Value Date   WBC 9.8 10/07/2020   HGB 14.0 10/07/2020   HCT 42.1 10/07/2020   MCV 93.1 10/07/2020   PLT 192.0 10/07/2020   Lab Results  Component Value Date   CREATININE 1.41 10/07/2020   BUN 26 (H) 10/07/2020   NA 142 10/07/2020   K 4.2 10/07/2020   CL 107 10/07/2020   CO2 25 10/07/2020   Lab Results  Component Value Date   ALT 20 10/07/2020   AST 14 10/07/2020   ALKPHOS 42 10/07/2020   BILITOT 0.5 10/07/2020   Lab Results  Component Value Date   CHOL 113 10/07/2020   HDL 41.60 10/07/2020   LDLCALC 40 10/07/2020   TRIG 159.0 (H) 10/07/2020   CHOLHDL  3 10/07/2020    Lab Results  Component Value Date   HGBA1C 7.0 (H) 10/07/2020   Lab Results  Component Value Date   TSH 1.36 10/07/2020     STUDIES/PROCEDURES reviewed today   Pending scheduled echo  MPI 10/21/2020 Pharmacological myocardial perfusion imaging study with no significant  ischemia Normal wall motion, EF estimated at 54% No EKG changes concerning for ischemia at peak stress or in recovery. CT attenuation correction images with no significant coronary calcification or aortic atherosclerosis Low risk scan  Assessment & Plan    Coronary artery disease without angina Dyspnea, concerning for anginal equivalent -- No chest pain. Breathing improved, though still DOE with steps and SOB with watching TV.  CAC seen on CT 11/2017.  MPI as above and reassuring. EKG still without acute ST/T changes.  No evidence of arrhythmia/significant ectopy on EKG. Still pending is the ordered echo to assess EF, WM, valvular function, and rule out any acute structural changes. BP today significantly improved from previous with pt report of improved sx with improved BP. Will increase to amlodipine 10 mg daily today for further BP support. He will continue to work on dietary restrictions and monitor home wt and BP.    Essential hypertension, goal BP 130/80 or lower -- BP today improved from previous at 134/82 and since starting amlodipine 5 mg daily qd. BP log reviewed with recommendation to increase to amlodipine 21m today.  He will continue to monitor his BP/wt at home. Given his DM2 and hypertension, if further BP support needed in the future, recommend addition of ARB.    Tachypalpitations  -- Reports resolution with BP support. If tachypalpitations in the future, recommend Zio monitor at that time.  EKG without arrhythmia.  TSH WNL & electrolytes WNL.  HLD with goal LDL <70, Hypertriglyceridemia -- Recent total cholesterol 113, HDL 41.60, LDL 40, Tg 159.0. Continue statin. Could consider  Vascepa if ongoing elevated Tg. Will first attempt lifestyle changes with increased  activity and diet changes.   DM2 -- Hemoglobin A1c 7.0.  Tx and management per PCP.  As above, ARB should be considered if further BP support needed in the future.  CKD -- Cr 1.41, BUN 26.  Baseline Cr 1.3 to 1.4.  As above, consider ARB in the future given comorbid DM2 and HTN.  Caution with nephrotoxins.  Low threshold to refer to nephrology. BP and glycemic control recommended moving forward for cardiovascular and renal health.  GERD -- Denies any symptoms of reflux.  Continue to monitor. Continue PPI.   Nicotine use -- Complete cessation of all nicotine recommended for risk factor modification.  Medication changes: Increased amlodipine 10 mg daily Labs ordered: None Studies / Imaging ordered: Echo not yet performed Future considerations: Diet changes then Vascepa if ongoing elevated Tg, Zio XT if ongoing tachypalpitations, ARB if further BP control needed, nephrology referral if needed Disposition: RTC after 3-4 months   *Please be aware that the above documentation was completed voice recognition software and may contain dictation errors.      Arvil Chaco, PA-C 10/27/2020

## 2020-10-27 NOTE — Progress Notes (Unsigned)
Contacted and scheduled for annual  lung screening scan. Patient is a former smoker, quit 2018, 55 pack year history.

## 2020-10-27 NOTE — Patient Instructions (Addendum)
Medication Instructions:   Your physician has recommended you make the following change in your medication:   INCREASE Amlodipine 10mg  daily   *If you need a refill on your cardiac medications before your next appointment, please call your pharmacy*   Lab Work:  None ordered   Testing/Procedures:  None ordered   Follow-Up: At [facility], you and your health needs are our priority.  As part of our continuing mission to provide you with exceptional heart care, we have created designated Provider Care Teams.  These Care Teams include your primary Cardiologist (physician) and Advanced Practice Providers (APPs -  Physician Assistants and Nurse Practitioners) who all work together to provide you with the care you need, when you need it.  We recommend signing up for the patient portal called "MyChart".  Sign up information is provided on this After Visit Summary.  MyChart is used to connect with patients for Virtual Visits (Telemedicine).  Patients are able to view lab/test results, encounter notes, upcoming appointments, etc.  Non-urgent messages can be sent to your provider as well.   To learn more about what you can do with MyChart, go to [email or web address].    Your next appointment:   3 - 4 month(s)  The format for your next appointment:   In Person  Provider:   You may see Dr. Rockey Situ or one of the following Advanced Practice Providers on your designated Care Team:    Marrianne Mood, [place]

## 2020-11-04 ENCOUNTER — Ambulatory Visit
Admission: RE | Admit: 2020-11-04 | Discharge: 2020-11-04 | Disposition: A | Discharge status: Home / Self Care | Payer: Medicare Other | Source: Ambulatory Visit | Attending: Nurse Practitioner | Admitting: Nurse Practitioner

## 2020-11-04 ENCOUNTER — Other Ambulatory Visit: Payer: Self-pay

## 2020-11-04 DIAGNOSIS — Z122 Encounter for screening for malignant neoplasm of respiratory organs: Secondary | ICD-10-CM | POA: Insufficient documentation

## 2020-11-04 DIAGNOSIS — Z87891 Personal history of nicotine dependence: Secondary | ICD-10-CM | POA: Diagnosis not present

## 2020-11-05 ENCOUNTER — Telehealth: Payer: Self-pay | Admitting: Family Medicine

## 2020-11-05 NOTE — Telephone Encounter (Signed)
Please update patient.  Lung cancer screening CT done.  No significant change in previously seen pulmonary nodules.  He should get a phone call about scheduling a follow-up CT in about 1 year.  Thanks.

## 2020-11-06 NOTE — Telephone Encounter (Signed)
Patient aware of results and advised he will get a call to schedule f/u CT in about 1 year. Patient verbalized understanding.

## 2020-11-11 ENCOUNTER — Other Ambulatory Visit: Payer: Self-pay | Admitting: Family Medicine

## 2020-11-21 ENCOUNTER — Ambulatory Visit (INDEPENDENT_AMBULATORY_CARE_PROVIDER_SITE_OTHER): Payer: Medicare Other

## 2020-11-21 ENCOUNTER — Other Ambulatory Visit: Payer: Self-pay

## 2020-11-21 DIAGNOSIS — R0600 Dyspnea, unspecified: Secondary | ICD-10-CM | POA: Diagnosis not present

## 2020-11-21 DIAGNOSIS — R0609 Other forms of dyspnea: Secondary | ICD-10-CM

## 2020-11-21 LAB — ECHOCARDIOGRAM COMPLETE
AR max vel: 3.16 cm2
AV Area VTI: 3.03 cm2
AV Area mean vel: 3.06 cm2
AV Mean grad: 3 mmHg
AV Peak grad: 4.8 mmHg
Ao pk vel: 1.09 m/s
Area-P 1/2: 5.38 cm2
Calc EF: 39.9 %
S' Lateral: 3.4 cm
Single Plane A2C EF: 43.9 %
Single Plane A4C EF: 39.7 %

## 2020-11-25 ENCOUNTER — Encounter: Payer: Self-pay | Admitting: *Deleted

## 2020-12-03 ENCOUNTER — Telehealth: Payer: Self-pay | Admitting: Physician Assistant

## 2020-12-03 DIAGNOSIS — H02831 Dermatochalasis of right upper eyelid: Secondary | ICD-10-CM | POA: Diagnosis not present

## 2020-12-03 DIAGNOSIS — H02834 Dermatochalasis of left upper eyelid: Secondary | ICD-10-CM | POA: Diagnosis not present

## 2020-12-03 NOTE — Telephone Encounter (Signed)
Patient made aware of echo results with verbalized understanding. 

## 2020-12-03 NOTE — Telephone Encounter (Signed)
Visser, Jacquelyn D, PA-C  P Cv Div [place] Triage Echo showed  --Pump function or heart squeeze.  --Walls of the heart are moving normally.  --Top left chamber size is mildly dilated, which is a finding we sometimes find with sleep apnea.  We can discuss this further at follow-up.  --Mildly leaky mitral valve, which is not concerning at this time, and which we can continue to monitor with periodic echo.   Overall, reassuring echo.  We can of course discuss this further at follow-up.

## 2020-12-03 NOTE — Telephone Encounter (Signed)
Called to give the patient echo results. Patient is at a doctors office and will need to call back.

## 2020-12-11 DIAGNOSIS — H02401 Unspecified ptosis of right eyelid: Secondary | ICD-10-CM | POA: Diagnosis not present

## 2020-12-18 ENCOUNTER — Other Ambulatory Visit: Payer: Self-pay

## 2020-12-18 ENCOUNTER — Ambulatory Visit (INDEPENDENT_AMBULATORY_CARE_PROVIDER_SITE_OTHER): Payer: Medicare Other | Admitting: Family Medicine

## 2020-12-18 ENCOUNTER — Encounter: Payer: Self-pay | Admitting: Family Medicine

## 2020-12-18 VITALS — BP 120/72 | HR 76 | Temp 97.4°F | Ht 71.0 in | Wt 210.0 lb

## 2020-12-18 DIAGNOSIS — E119 Type 2 diabetes mellitus without complications: Secondary | ICD-10-CM | POA: Diagnosis not present

## 2020-12-18 DIAGNOSIS — I2510 Atherosclerotic heart disease of native coronary artery without angina pectoris: Secondary | ICD-10-CM

## 2020-12-18 LAB — POCT GLYCOSYLATED HEMOGLOBIN (HGB A1C): Hemoglobin A1C: 7 % — AB (ref 4.0–5.6)

## 2020-12-18 NOTE — Progress Notes (Signed)
This visit occurred during the SARS-CoV-2 public health emergency.  Safety protocols were in place, including screening questions prior to the visit, additional usage of staff PPE, and extensive cleaning of exam room while observing appropriate contact time as indicated for disinfecting solutions.  Diabetes:  No meds.   Hypoglycemic episodes:no Hyperglycemic episodes:no Feet problems: no Blood Sugars averaging: 110-150 prior to meals.   eye exam within last year: yes A1c 7.   Discussed diet and exercise and his blood sugar log.  Meds, vitals, and allergies reviewed.  ROS: Per HPI unless specifically indicated in ROS section   GEN: nad, alert and oriented HEENT: ncat NECK: supple w/o LA CV: rrr. PULM: ctab, no inc wob ABD: soft, +bs EXT: no edema SKIN: well perfused.

## 2020-12-18 NOTE — Patient Instructions (Signed)
Don't change your meds for now.  Take care.  Glad to see you. You could try checking sugar prior to and then 2 hours after a meal if needed.

## 2020-12-21 NOTE — Assessment & Plan Note (Addendum)
Reasonable control with A1c of 7.  Discussed options. he could try checking sugar prior to and then 2 hours after a meal if needed.   Recheck periodically.  No change in medications.  He agrees with plan. 20 minutes were devoted to patient care in this encounter (this includes time spent reviewing the patient's file/history, interviewing and examining the patient, counseling/reviewing plan with patient).

## 2020-12-23 DIAGNOSIS — D2262 Melanocytic nevi of left upper limb, including shoulder: Secondary | ICD-10-CM | POA: Diagnosis not present

## 2020-12-23 DIAGNOSIS — L570 Actinic keratosis: Secondary | ICD-10-CM | POA: Diagnosis not present

## 2020-12-23 DIAGNOSIS — D2261 Melanocytic nevi of right upper limb, including shoulder: Secondary | ICD-10-CM | POA: Diagnosis not present

## 2020-12-23 DIAGNOSIS — Z85828 Personal history of other malignant neoplasm of skin: Secondary | ICD-10-CM | POA: Diagnosis not present

## 2020-12-23 DIAGNOSIS — D2271 Melanocytic nevi of right lower limb, including hip: Secondary | ICD-10-CM | POA: Diagnosis not present

## 2020-12-23 DIAGNOSIS — X32XXXA Exposure to sunlight, initial encounter: Secondary | ICD-10-CM | POA: Diagnosis not present

## 2020-12-23 DIAGNOSIS — D225 Melanocytic nevi of trunk: Secondary | ICD-10-CM | POA: Diagnosis not present

## 2021-01-20 ENCOUNTER — Encounter: Payer: Self-pay | Admitting: Ophthalmology

## 2021-01-20 ENCOUNTER — Other Ambulatory Visit: Payer: Self-pay

## 2021-01-21 NOTE — Anesthesia Preprocedure Evaluation (Addendum)
Anesthesia Evaluation  Patient identified by MRN, date of birth, ID band Patient awake    Reviewed: Allergy & Precautions, NPO status , Patient's Chart, lab work & pertinent test results  History of Anesthesia Complications (+) PONV and history of anesthetic complications  Airway Mallampati: II   Neck ROM: Full    Dental  (+) Upper Dentures   Pulmonary COPD, former smoker (quit 2018),    Pulmonary exam normal breath sounds clear to auscultation       Cardiovascular hypertension, Normal cardiovascular exam Rhythm:Regular Rate:Normal     Neuro/Psych negative neurological ROS     GI/Hepatic GERD (Barrett esophagus)  Controlled,  Endo/Other  diabetes, Type 2  Renal/GU negative Renal ROS     Musculoskeletal   Abdominal   Peds  Hematology negative hematology ROS (+)   Anesthesia Other Findings Current snuff use  Reproductive/Obstetrics                            Anesthesia Physical Anesthesia Plan  ASA: 3  Anesthesia Plan: MAC   Post-op Pain Management:    Induction: Intravenous  PONV Risk Score and Plan: 2 and TIVA, Midazolam and Treatment may vary due to age or medical condition  Airway Management Planned: Nasal Cannula  Additional Equipment:   Intra-op Plan:   Post-operative Plan:   Informed Consent: I have reviewed the patients History and Physical, chart, labs and discussed the procedure including the risks, benefits and alternatives for the proposed anesthesia with the patient or authorized representative who has indicated his/her understanding and acceptance.       Plan Discussed with: CRNA  Anesthesia Plan Comments: (Avoid midazolam -- pt reports postoperative visual hallucinations postoperatively.)       Anesthesia Quick Evaluation

## 2021-01-28 NOTE — Discharge Instructions (Signed)

## 2021-01-30 ENCOUNTER — Encounter: Payer: Self-pay | Admitting: Ophthalmology

## 2021-01-30 ENCOUNTER — Ambulatory Visit: Payer: Medicare Other | Admitting: Anesthesiology

## 2021-01-30 ENCOUNTER — Encounter
Admission: RE | Disposition: A | Discharge status: Home / Self Care | Payer: Self-pay | Source: Home / Self Care | Attending: Ophthalmology

## 2021-01-30 ENCOUNTER — Ambulatory Visit
Admission: RE | Admit: 2021-01-30 | Discharge: 2021-01-30 | Disposition: A | Discharge status: Home / Self Care | Payer: Medicare Other | Attending: Ophthalmology | Admitting: Ophthalmology

## 2021-01-30 ENCOUNTER — Other Ambulatory Visit: Payer: Self-pay

## 2021-01-30 DIAGNOSIS — Z9049 Acquired absence of other specified parts of digestive tract: Secondary | ICD-10-CM | POA: Insufficient documentation

## 2021-01-30 DIAGNOSIS — Z803 Family history of malignant neoplasm of breast: Secondary | ICD-10-CM | POA: Insufficient documentation

## 2021-01-30 DIAGNOSIS — E119 Type 2 diabetes mellitus without complications: Secondary | ICD-10-CM | POA: Diagnosis not present

## 2021-01-30 DIAGNOSIS — Z833 Family history of diabetes mellitus: Secondary | ICD-10-CM | POA: Insufficient documentation

## 2021-01-30 DIAGNOSIS — Z888 Allergy status to other drugs, medicaments and biological substances status: Secondary | ICD-10-CM | POA: Diagnosis not present

## 2021-01-30 DIAGNOSIS — Z881 Allergy status to other antibiotic agents status: Secondary | ICD-10-CM | POA: Diagnosis not present

## 2021-01-30 DIAGNOSIS — Z89019 Acquired absence of unspecified thumb: Secondary | ICD-10-CM | POA: Diagnosis not present

## 2021-01-30 DIAGNOSIS — Z801 Family history of malignant neoplasm of trachea, bronchus and lung: Secondary | ICD-10-CM | POA: Insufficient documentation

## 2021-01-30 DIAGNOSIS — H02401 Unspecified ptosis of right eyelid: Secondary | ICD-10-CM | POA: Diagnosis not present

## 2021-01-30 DIAGNOSIS — I10 Essential (primary) hypertension: Secondary | ICD-10-CM | POA: Insufficient documentation

## 2021-01-30 DIAGNOSIS — H02831 Dermatochalasis of right upper eyelid: Secondary | ICD-10-CM | POA: Insufficient documentation

## 2021-01-30 DIAGNOSIS — Z886 Allergy status to analgesic agent status: Secondary | ICD-10-CM | POA: Diagnosis not present

## 2021-01-30 DIAGNOSIS — Z85828 Personal history of other malignant neoplasm of skin: Secondary | ICD-10-CM | POA: Insufficient documentation

## 2021-01-30 DIAGNOSIS — J439 Emphysema, unspecified: Secondary | ICD-10-CM | POA: Insufficient documentation

## 2021-01-30 DIAGNOSIS — K219 Gastro-esophageal reflux disease without esophagitis: Secondary | ICD-10-CM | POA: Diagnosis not present

## 2021-01-30 DIAGNOSIS — F17220 Nicotine dependence, chewing tobacco, uncomplicated: Secondary | ICD-10-CM | POA: Diagnosis not present

## 2021-01-30 DIAGNOSIS — Z8249 Family history of ischemic heart disease and other diseases of the circulatory system: Secondary | ICD-10-CM | POA: Insufficient documentation

## 2021-01-30 DIAGNOSIS — H02834 Dermatochalasis of left upper eyelid: Secondary | ICD-10-CM | POA: Diagnosis not present

## 2021-01-30 DIAGNOSIS — Z79899 Other long term (current) drug therapy: Secondary | ICD-10-CM | POA: Insufficient documentation

## 2021-01-30 DIAGNOSIS — Z8719 Personal history of other diseases of the digestive system: Secondary | ICD-10-CM | POA: Diagnosis not present

## 2021-01-30 DIAGNOSIS — E7800 Pure hypercholesterolemia, unspecified: Secondary | ICD-10-CM | POA: Insufficient documentation

## 2021-01-30 HISTORY — DX: Emphysema, unspecified: J43.9

## 2021-01-30 HISTORY — PX: BROW LIFT: SHX178

## 2021-01-30 HISTORY — DX: Presence of dental prosthetic device (complete) (partial): Z97.2

## 2021-01-30 HISTORY — DX: Other specified postprocedural states: Z98.890

## 2021-01-30 HISTORY — DX: Nausea with vomiting, unspecified: R11.2

## 2021-01-30 SURGERY — BLEPHAROPLASTY
Anesthesia: Monitor Anesthesia Care | Site: Eye | Laterality: Right

## 2021-01-30 MED ORDER — MIDAZOLAM HCL 2 MG/2ML IJ SOLN
INTRAMUSCULAR | Status: DC | PRN
  Administered 2021-01-30 (×4): 1 mg via INTRAVENOUS

## 2021-01-30 MED ORDER — ONDANSETRON HCL 4 MG/2ML IJ SOLN
INTRAMUSCULAR | Status: DC | PRN
Start: 2021-01-30 — End: 2021-01-30
  Administered 2021-01-30: 4 mg via INTRAVENOUS

## 2021-01-30 MED ORDER — BSS IO SOLN
INTRAOCULAR | Status: DC | PRN
  Administered 2021-01-30: 15 mL

## 2021-01-30 MED ORDER — ERYTHROMYCIN 5 MG/GM OP OINT: TOPICAL_OINTMENT | OPHTHALMIC | 2 refills | Status: DC

## 2021-01-30 MED ORDER — ACETAMINOPHEN 325 MG PO TABS: 650.0000 mg | ORAL_TABLET | Freq: Once | ORAL | Status: DC | PRN

## 2021-01-30 MED ORDER — LIDOCAINE-EPINEPHRINE 2 %-1:100000 IJ SOLN
INTRAMUSCULAR | Status: DC | PRN
  Administered 2021-01-30 (×2): .5 mL via OPHTHALMIC

## 2021-01-30 MED ORDER — ONDANSETRON HCL 4 MG/2ML IJ SOLN: 4.0000 mg | Freq: Once | INTRAMUSCULAR | Status: DC | PRN

## 2021-01-30 MED ORDER — ACETAMINOPHEN 160 MG/5ML PO SOLN: 325.0000 mg | ORAL | Status: DC | PRN

## 2021-01-30 MED ORDER — TRAMADOL HCL 50 MG PO TABS: ORAL_TABLET | ORAL | 0 refills | Status: DC

## 2021-01-30 MED ORDER — ALFENTANIL 500 MCG/ML IJ INJ
INJECTION | INTRAVENOUS | Status: DC | PRN
  Administered 2021-01-30: 500 ug via INTRAVENOUS
  Administered 2021-01-30: 100 ug via INTRAVENOUS
  Administered 2021-01-30: 200 ug via INTRAVENOUS

## 2021-01-30 MED ORDER — TETRACAINE HCL 0.5 % OP SOLN
OPHTHALMIC | Status: DC | PRN
  Administered 2021-01-30: 2 [drp] via OPHTHALMIC

## 2021-01-30 MED ORDER — LACTATED RINGERS IV SOLN: INTRAVENOUS | Status: DC

## 2021-01-30 MED ORDER — ERYTHROMYCIN 5 MG/GM OP OINT
TOPICAL_OINTMENT | OPHTHALMIC | Status: DC | PRN
  Administered 2021-01-30: 1 via OPHTHALMIC

## 2021-01-30 SURGICAL SUPPLY — 35 items
APPLICATOR COTTON TIP WD 3 STR (MISCELLANEOUS) ×2 IMPLANT
BLADE SURG 15 STRL LF DISP TIS (BLADE) ×1 IMPLANT
BLADE SURG 15 STRL SS (BLADE) ×2
CORD BIP STRL DISP 12FT (MISCELLANEOUS) ×2 IMPLANT
GAUZE SPONGE 4X4 12PLY STRL (GAUZE/BANDAGES/DRESSINGS) ×2 IMPLANT
GLOVE SURG LX 7.0 MICRO (GLOVE) ×2
GLOVE SURG LX STRL 7.0 MICRO (GLOVE) ×2 IMPLANT
GOWN STRL REUS W/ TWL LRG LVL3 (GOWN DISPOSABLE) ×1 IMPLANT
GOWN STRL REUS W/TWL LRG LVL3 (GOWN DISPOSABLE) ×2
MARKER SKIN XFINE TIP W/RULER (MISCELLANEOUS) ×2 IMPLANT
NEEDLE FILTER BLUNT 18X 1/2SAF (NEEDLE) ×1
NEEDLE FILTER BLUNT 18X1 1/2 (NEEDLE) ×1 IMPLANT
NEEDLE HYPO 30X.5 LL (NEEDLE) ×4 IMPLANT
PACK ENT CUSTOM (PACKS) ×2 IMPLANT
SOL PREP PVP 2OZ (MISCELLANEOUS) ×2
SOLUTION PREP PVP 2OZ (MISCELLANEOUS) ×1 IMPLANT
SPONGE GAUZE 2X2 8PLY STRL LF (GAUZE/BANDAGES/DRESSINGS) ×20 IMPLANT
SUT CHROMIC 4-0 (SUTURE)
SUT CHROMIC 4-0 M2 12X2 ARM (SUTURE)
SUT CHROMIC 5 0 P 3 (SUTURE) IMPLANT
SUT ETHILON 4 0 CL P 3 (SUTURE) IMPLANT
SUT GUT PLAIN 6-0 1X18 ABS (SUTURE) ×2 IMPLANT
SUT MERSILENE 4-0 S-2 (SUTURE) IMPLANT
SUT PROLENE 5 0 P 3 (SUTURE) IMPLANT
SUT PROLENE 6 0 P 1 18 (SUTURE) ×2 IMPLANT
SUT SILK 4 0 G 3 (SUTURE) IMPLANT
SUT VIC AB 5-0 P-3 18X BRD (SUTURE) IMPLANT
SUT VIC AB 5-0 P3 18 (SUTURE)
SUT VICRYL 6-0  S14 CTD (SUTURE)
SUT VICRYL 6-0 S14 CTD (SUTURE) IMPLANT
SUT VICRYL 7 0 TG140 8 (SUTURE) IMPLANT
SUTURE CHRMC 4-0 M2 12X2 ARM (SUTURE) IMPLANT
SYR 10ML LL (SYRINGE) ×2 IMPLANT
SYR 3ML LL SCALE MARK (SYRINGE) ×2 IMPLANT
WATER STERILE IRR 250ML POUR (IV SOLUTION) ×2 IMPLANT

## 2021-01-30 NOTE — Anesthesia Procedure Notes (Signed)
Date/Time: 01/30/2021 7:56 AM Performed by: Dionne Bucy, CRNA Pre-anesthesia Checklist: Patient identified, Emergency Drugs available, Suction available, Patient being monitored and Timeout performed Patient Re-evaluated:Patient Re-evaluated prior to induction Oxygen Delivery Method: Nasal cannula Placement Confirmation: positive ETCO2

## 2021-01-30 NOTE — H&P (Signed)
[facility]: [facility]  Primary Care Physician:  Duncan, Graham S, MD Ophthalmologist: Dr. Amy M. Fowler, M.D.  Pre-Procedure History & Physical: HPI:  Brian B Delgrande Jr. is a 69 y.o. male here for periocular surgery.   Past Medical History:  Diagnosis Date   Amputation, thumb, traumatic 1968   tip amputated    Barrett's esophagus 05/08/02, 02/15/05, 2009   EGD- Barrett's esoph, HH, gastritis, diverticulitis   Basal cell cancer 05/2000   of nose, face (seen at [facility] 2015)   Diabetes mellitus without complication (HCC)    Diet controlled   Emphysema lung (HCC)    Esophageal reflux    History of colon polyps    Hypertension    controlled on meds   Lipoma of unspecified site    PONV (postoperative nausea and vomiting)    as a 70 yr old   Pure hypercholesterolemia    Tobacco use disorder    uses snuff   Unspecified gastritis and gastroduodenitis without mention of hemorrhage    Wears dentures    upper   Wrist fracture, left    as a child    Past Surgical History:  Procedure Laterality Date   CATARACT EXTRACTION     2012   CHOLECYSTECTOMY, LAPAROSCOPIC  12/20/06   [place]   EYE SURGERY     trauma to right eye as a child, growth removed from eye 2 years later   Eyelid surgery Bilateral 06/2020   LAMINECTOMY  1984   L 4/5   UPPER GI ENDOSCOPY  9/205    Prior to Admission medications   Medication Sig Start Date End Date Taking? Authorizing Provider  amLODipine (NORVASC) 10 MG tablet Take 1 tablet (10 mg total) by mouth daily. 10/27/20  Yes Visser, Jacquelyn D, PA-C  pantoprazole (PROTONIX) 40 MG tablet TAKE 1 TABLET BY MOUTH EVERY DAY 03/17/20  Yes Duncan, Graham S, MD  Accu-Chek Softclix Lancets lancets USE TO TEST UP 4 TIMES A DAY (E11.9- NOT USING INSULIN) 11/11/20   Duncan, Graham S, MD  blood glucose meter kit and supplies Dispense based on patient and insurance preference. Use up to four times daily as directed. (FOR ICD-10 E10.9, E11.9). 03/31/20   Duncan,  Graham S, MD  glucose blood (ACCU-CHEK GUIDE) test strip USE TO TEST UP TO 4 TIMES A DAY E11.9 (NOT USE INSULIN) (INS ONLY COVERS EVERY 90 DAYS) 11/11/20   Duncan, Graham S, MD  pravastatin (PRAVACHOL) 10 MG tablet Take 1 tablet (10 mg total) by mouth daily. 03/17/20   Duncan, Graham S, MD    Allergies as of 12/31/2020 - Review Complete 12/18/2020  Allergen Reaction Noted   Azithromycin Rash 11/01/2012   [name] Other (See Comments) 11/01/2014   Tolmetin  11/01/2014    Family History  Problem Relation Age of Onset   Diabetes Mother    Heart disease Mother        MI   Alzheimer's disease Mother        progressing   Cancer Father        possible colon   Diabetes Father    Heart disease Father        CHF   Stroke Father    Hypertension Father    Colon cancer Father        possible dx   Hypertension Sister    Colon polyps Sister    Cancer Sister        breast cancer   Cancer Brother          lung- smoker   Diabetes Brother    Colon polyps Brother    Hypertension Sister    Colon polyps Sister    Depression Neg Hx    Alcohol abuse Neg Hx    Drug abuse Neg Hx    Prostate cancer Neg Hx     Social History   Socioeconomic History   Marital status: Married    Spouse name: Not on file   Number of children: 3   Years of education: Not on file   Highest education level: Not on file  Occupational History   Occupation: retired fire fighter, on disability for back pain  Tobacco Use   Smoking status: Former    Packs/day: 1.00    Years: 55.00    Pack years: 55.00    Types: Cigarettes    Quit date: 04/10/2017    Years since quitting: 3.8   Smokeless tobacco: Current    Types: Snuff  Vaping Use   Vaping Use: Former  Substance and Sexual Activity   Alcohol use: No    Alcohol/week: 0.0 standard drinks   Drug use: No   Sexual activity: Not on file  Other Topics Concern   Not on file  Social History Narrative   3 children initially- 2 sons locally, one daughter died at age 4  from tetrology of fallot.   [employer] 1970-[employer], domestic   Married 1970   Social Determinants of Health   Financial Resource Strain: Low Risk    Difficulty of Paying Living Expenses: Not hard at all  Food Insecurity: No Food Insecurity   Worried About Running Out of Food in the Last Year: Never true   Ran Out of Food in the Last Year: Never true  Transportation Needs: No Transportation Needs   Lack of Transportation (Medical): No   Lack of Transportation (Non-Medical): No  Physical Activity: Inactive   Days of Exercise per Week: 0 days   Minutes of Exercise per Session: 0 min  Stress: No Stress Concern Present   Feeling of Stress : Not at all  Social Connections: Not on file  Intimate Partner Violence: Not At Risk   Fear of Current or Ex-Partner: No   Emotionally Abused: No   Physically Abused: No   Sexually Abused: No    Review of Systems: See HPI, otherwise negative ROS  Physical Exam: BP 124/74   Pulse 73   Temp (!) 97.5 F (36.4 C) (Temporal)   Resp 20   Ht 5' 11" (1.803 m)   Wt 95.7 kg   SpO2 96%   BMI 29.43 kg/m  General:   Alert and cooperative in NAD Head:  Normocephalic and atraumatic. Respiratory:  Normal work of breathing.  Impression/Plan: Brian B Weatherholtz Jr. is here for periocular surgery.  Risks, benefits, limitations, and alternatives regarding surgery have been reviewed with the patient.  Questions have been answered.  All parties agreeable.   Fowler, Amy M, MD  01/30/2021, 7:45 AM   

## 2021-01-30 NOTE — Interval H&P Note (Signed)
History and Physical Interval Note:  01/30/2021 7:45 AM  Brian Hull.  has presented today for surgery, with the diagnosis of H02.831 Dermatochalasis of Right Upper Eyelid H02.401 Ptosis of Right Eyelid, Unspecifed.  The various methods of treatment have been discussed with the patient and family. After consideration of risks, benefits and other options for treatment, the patient has consented to  Procedure(s): BLEPHAROPLASTY UPPER EYELID; W/EXCESS SKIN BLEPHAROPTOSIS REPAIR; RESECT SKIN RIGHT (Right) as a surgical intervention.  The patient's history has been reviewed, patient examined, no change in status, stable for surgery.  I have reviewed the patient's chart and labs.  Questions were answered to the patient's satisfaction.     Vickki Muff, Abrar Koone M

## 2021-01-30 NOTE — Transfer of Care (Signed)
Immediate Anesthesia Transfer of Care Note  Patient: Brian Hull.  Procedure(s) Performed: BLEPHAROPLASTY UPPER EYELID; W/EXCESS SKIN BLEPHAROPTOSIS REPAIR; RESECT SKIN RIGHT (Right: Eye)  Patient Location: PACU  Anesthesia Type: MAC  Level of Consciousness: awake, alert  and patient cooperative  Airway and Oxygen Therapy: Patient Spontanous Breathing and Patient connected to supplemental oxygen  Post-op Assessment: Post-op Vital signs reviewed, Patient's Cardiovascular Status Stable, Respiratory Function Stable, Patent Airway and No signs of Nausea or vomiting  Post-op Vital Signs: Reviewed and stable  Complications: No notable events documented.

## 2021-01-30 NOTE — Anesthesia Postprocedure Evaluation (Signed)
Anesthesia Post Note  Patient: Brian B Mckinlay Jr.  Procedure(s) Performed: BLEPHAROPLASTY UPPER EYELID; W/EXCESS SKIN BLEPHAROPTOSIS REPAIR; RESECT SKIN RIGHT (Right: Eye)     Patient location during evaluation: PACU Anesthesia Type: MAC Level of consciousness: awake and alert, oriented and patient cooperative Pain management: pain level controlled Vital Signs Assessment: post-procedure vital signs reviewed and stable Respiratory status: spontaneous breathing, nonlabored ventilation and respiratory function stable Cardiovascular status: blood pressure returned to baseline and stable Postop Assessment: adequate PO intake Anesthetic complications: no   No notable events documented.  Khaden Gater      

## 2021-01-30 NOTE — Anesthesia Postprocedure Evaluation (Signed)
Anesthesia Post Note  Patient: Brian Hull.  Procedure(s) Performed: BLEPHAROPLASTY UPPER EYELID; W/EXCESS SKIN BLEPHAROPTOSIS REPAIR; RESECT SKIN RIGHT (Right: Eye)     Patient location during evaluation: PACU Anesthesia Type: MAC Level of consciousness: awake and alert, oriented and patient cooperative Pain management: pain level controlled Vital Signs Assessment: post-procedure vital signs reviewed and stable Respiratory status: spontaneous breathing, nonlabored ventilation and respiratory function stable Cardiovascular status: blood pressure returned to baseline and stable Postop Assessment: adequate PO intake Anesthetic complications: no   No notable events documented.  Darrin Nipper

## 2021-01-30 NOTE — Op Note (Signed)
Preoperative Diagnosis:  1. Visually significant blepharoptosis right  Upper Eyelid(s) 2. Visually significant dermatochalasis right  Upper Eyelid(s)  Postoperative Diagnosis:  Same.  Procedure(s) Performed:   1. Blepharoptosis repair with levator aponeurosis advancement right  Upper Eyelid(s) 2. Upper eyelid blepharoplasty with excess skin excision  right  Upper Eyelid(s)  Surgeon: Brian Hull, M.D.  Assistants: none  Anesthesia: MAC  Specimens: None.  Estimated Blood Loss: Minimal.  Complications: None.  Operative Findings: None Dictated  Procedure:   Allergies were reviewed and the patient Brian Hull, Brian Hull, and Brian Hull.   After the risks, benefits, complications and alternatives were discussed with the patient, appropriate informed consent was obtained.  While seated in an upright position and looking in primary gaze, the mid pupillary line was marked on the upper eyelid margins bilaterally. [name].  Timeout was conducted and the patient was sedated.  Local anesthetic consisting of a 50-50 mixture of 2% lidocaine with epinephrine and 0.75% bupivacaine with added Hylenex was injected subcutaneously to right  upper eyelid(s). After adequate local was instilled, the patient was prepped and draped in the usual sterile fashion for eyelid surgery.   Attention was turned to the upper eyelids. A 63m upper eyelid crease incision line was marked with calipers on right  upper eyelid(s).  A pinch test was used to estimate the amount of excess skin to remove and this was marked in standard blepharoplasty style fashion. Attention was turned to the  right  upper eyelid. A #15 blade was used to open the premarked incision line. A Skin and muscle flap was excised and hemostasis was obtained with bipolar cautery. A buttonhole was created centrally in the orbital septum to reveal the central fat pocket. This was dissected free  from fascial attachments, cauterized towards the pedicle base and excised to produce a nice flattening of the central upper eyelid.  Westcott scissors were then used to transect through orbicularis down to the tarsal plate. Epitarsus was dissected to create a smooth surface to suture to. Dissection was then carried superiorly in the plane between orbicularis and orbital septum. Once the preaponeurotic fat pocket was identified, the orbital septum was opened. This revealed the levator and its aponeurosis.     3 interrupted 6-0 Prolene sutures were then passed partial thickness through the tarsal plates of right  upper eyelid(s). These sutures were placed in line with the mid pupillary, medial limbal, and lateral limbal lines. The sutures were fixed to the levator aponeurosis and adjusted until a nice lid height and contour were achieved. Once nice symmetry was achieved, the skin incisions were closed with a running 6-0 plain gut suture. The patient tolerated the procedure well.  Erythromycin ophthalmic ophthalmic ointment was applied to the incision site(s) followed by ice packs. [name] where he recovered without difficulty.  Post-Op Plan/Instructions:   The patient was instructed to use ice packs frequently for the next 48 hours. He was instructed to use Erythromycin ophthalmic ophthalmic ointment on his incisions 4 times a day for the next 12 to 14 days. Hewas given a prescription for tramadol (or similar) for pain control should Tylenol not be effective. [facility] [place] [facility] or sooner as needed for problems.  Brian Hull M. FVickki Hull M.D. Ophthalmology

## 2021-02-02 ENCOUNTER — Encounter: Payer: Self-pay | Admitting: Ophthalmology

## 2021-02-13 ENCOUNTER — Encounter: Payer: Self-pay | Admitting: Physician Assistant

## 2021-02-13 ENCOUNTER — Ambulatory Visit (INDEPENDENT_AMBULATORY_CARE_PROVIDER_SITE_OTHER): Payer: Medicare Other | Admitting: Physician Assistant

## 2021-02-13 ENCOUNTER — Other Ambulatory Visit: Payer: Self-pay

## 2021-02-13 VITALS — BP 130/70 | HR 86 | Ht 71.0 in | Wt 212.0 lb

## 2021-02-13 DIAGNOSIS — N189 Chronic kidney disease, unspecified: Secondary | ICD-10-CM

## 2021-02-13 DIAGNOSIS — E781 Pure hyperglyceridemia: Secondary | ICD-10-CM

## 2021-02-13 DIAGNOSIS — I2510 Atherosclerotic heart disease of native coronary artery without angina pectoris: Secondary | ICD-10-CM

## 2021-02-13 DIAGNOSIS — Z720 Tobacco use: Secondary | ICD-10-CM | POA: Diagnosis not present

## 2021-02-13 DIAGNOSIS — E119 Type 2 diabetes mellitus without complications: Secondary | ICD-10-CM | POA: Diagnosis not present

## 2021-02-13 DIAGNOSIS — I10 Essential (primary) hypertension: Secondary | ICD-10-CM

## 2021-02-13 DIAGNOSIS — R002 Palpitations: Secondary | ICD-10-CM | POA: Diagnosis not present

## 2021-02-13 DIAGNOSIS — K219 Gastro-esophageal reflux disease without esophagitis: Secondary | ICD-10-CM | POA: Diagnosis not present

## 2021-02-13 DIAGNOSIS — Z87891 Personal history of nicotine dependence: Secondary | ICD-10-CM

## 2021-02-13 NOTE — Patient Instructions (Signed)
Medication Instructions:  - Your physician recommends that you continue on your current medications as directed. Please refer to the Current Medication list given to you today.  *If you need a refill on your cardiac medications before your next appointment, please call your pharmacy*   Lab Work: - none ordered  If you have labs (blood work) drawn today and your tests are completely normal, you will receive your results only by: [place] (if you have MyChart) OR A paper copy in the mail If you have any lab test that is abnormal or we need to change your treatment, we will call you to review the results.   Testing/Procedures: - none ordered   Follow-Up: At [facility], you and your health needs are our priority.  As part of our continuing mission to provide you with exceptional heart care, we have created designated Provider Care Teams.  These Care Teams include your primary Cardiologist (physician) and Advanced Practice Providers (APPs -  Physician Assistants and Nurse Practitioners) who all work together to provide you with the care you need, when you need it.  We recommend signing up for the patient portal called "MyChart".  Sign up information is provided on this After Visit Summary.  MyChart is used to connect with patients for Virtual Visits (Telemedicine).  Patients are able to view lab/test results, encounter notes, upcoming appointments, etc.  Non-urgent messages can be sent to your provider as well.   To learn more about what you can do with MyChart, go to [email or web address].    Your next appointment:   6 months- 1 year   The format for your next appointment:   In Person  Provider:   You may see Ida Rogue, MD or one of the following Advanced Practice Providers on your designated Care Team:   Murray Hodgkins, NP Christell Faith, PA-C Marrianne Mood, PA-C Cadence Kathlen Mody, [place]   Other Instructions N/a

## 2021-02-13 NOTE — Progress Notes (Signed)
Office Visit    Patient Name: Brian Hull. Date of Encounter: 02/13/2021  PCP:  Tonia Ghent, MD   [place]  Cardiologist:  Dr. Rockey Situ Advanced Practice Provider:  No care team member to display Electrophysiologist:  None :[phone]}   Chief Complaint    Chief Complaint  Patient presents with   Follow-up    4 Months follow up and medications verbally reviewed with patient.    70 y.o. male with history of coronary artery calcium on CT, previous tobacco use (quit 2018), snuff use, previous vapor cigarettes, DM2, and seen today for 4 mo follow-up.  Past Medical History    Past Medical History:  Diagnosis Date   Amputation, thumb, traumatic 1968   tip amputated    Barrett's esophagus 05/08/02, 02/15/05, 2009   EGD- Barrett's esoph, HH, gastritis, diverticulitis   Basal cell cancer 05/2000   of nose, face (seen at [facility] 2015)   Diabetes mellitus without complication (HCC)    Diet controlled   Emphysema lung ([place])    Esophageal reflux    History of colon polyps    Hypertension    controlled on meds   Lipoma of unspecified site    PONV (postoperative nausea and vomiting)    as a 70 yr old   Pure hypercholesterolemia    Tobacco use disorder    uses snuff   Unspecified gastritis and gastroduodenitis without mention of hemorrhage    Wears dentures    upper   Wrist fracture, left    as a child   Past Surgical History:  Procedure Laterality Date   BROW LIFT Right 01/30/2021   Procedure: BLEPHAROPLASTY UPPER EYELID; W/EXCESS SKIN BLEPHAROPTOSIS REPAIR; RESECT SKIN RIGHT;  Surgeon: Karle Starch, MD;  Location: [facility];  Service: Ophthalmology;  Laterality: Right;   CATARACT EXTRACTION     2012   CHOLECYSTECTOMY, LAPAROSCOPIC  12/20/06   [facility]   EYE SURGERY     trauma to right eye as a child, growth removed from eye 2 years later   Eyelid surgery Bilateral 06/2020   LAMINECTOMY  1984   L 4/5   UPPER GI ENDOSCOPY   9/205    Allergies  Allergies  Allergen Reactions   Azithromycin Rash   [name] Other (See Comments)    Edema at higher doses.  Not a true allergy o/w.     Tolmetin     Other reaction(s): Other (See Comments) Edema at higher doses.  Not a true allergy o/w.      History of Present Illness    Brian Hull. is a 70 y.o. male with PMH as above.   Previously reported receiving a letter in the mail from oncology regarding his coronary calcifications.  He reported that he was relatively active though no regular exercise program.  He had rare episodes of chest pain, typically with exertion and noted to be somewhat atypical in nature.  He had some shortness of breath without orthopnea.  His shortness of breath would happen with hills and stairs.  He reported stable symptoms.  He had rare episodes of night sweats 3 times per year with etiology unclear.  Images were reviewed with his primary cardiologist without significant calcification.  EKG without significant ST/T changes.  LDL was noted to be well controlled.  Diet and exercises changes were discussed.  Seen by his PCP 10/07/2020 and reported shortness of breath with exertion, reportedly going on for quite some time.  Seen 10/08/2020 and recalled his episodes of shortness of breath with exertion.  He reported dyspnea while trimming his holly tree 2 weeks earlier and another episode leading up to his clinic visit while trimming shrubs [place] the church.  He recovered with rest  He had DOE with steps and on the walk and to his clinic visit.  He noted SOB when seated.  He had head pressure.  He could hear his pulse in his ear.  He had intermittent palpitations and rates up to 110 bpm.  BP 188/100.  He was working to cut out sugar and salt from his diet, including chips/Fritos, Oreos, and ice cream.  Elevated Tg and Hgb A1C noted. Fhx included DM2.  Recommendation was to start amlodipine 5 mg daily, later increased to 81m daily. Echo and MPI also  recommended. It was noted Vascepa, ARB, and Zio could be considered.  MPI showed no significant ischemia with normal wall motion and EF estimated at 54%.  No EKG changes were concerning for ischemia at stress or in recovery.  It was noted CT attenuation corrected images were without significant CAC or aortic atherosclerosis.  Today, 02/13/21, he returns to clinic and doing well.  He denies any chest pain.  He reports previous brief incident of shortness of breath on steps without recurrence.  No recurrence of dizziness.  No syncopal episodes.  No falls.  He continues to report some symptoms due to allergies, such as head congestion.  He has some palpitations and occasionally hears his heart beat in his ear.  Weight at home has been 205 to 207 pounds.  SBP 1 10-1 30.  HR 60 to 80s.  He reports 10 hours a day with staff.  13 pts higher than that of the office. Home SBP 120-140, avg SBP 130, and rare episode up to SBP 170 when bringing in the groceries. HR 60-90bpm. Wt 206-209lbs. no signs or symptoms of bleeding. Medication changes discussed today but deferred with pt preference for lifestyle changes.   Home Medications   Current Outpatient Medications  Medication Instructions   Accu-Chek Softclix Lancets lancets USE TO TEST UP 4 TIMES A DAY (E11.9- NOT USING INSULIN)   amLODipine (NORVASC) 10 mg, Oral, Daily   blood glucose meter kit and supplies Dispense based on patient and insurance preference. Use up to four times daily as directed. (FOR ICD-10 E10.9, E11.9).   erythromycin ophthalmic ointment Apply to sutures 4 times a day for 10-12 days.  Discontinue if allergy develops and call our office   glucose blood (ACCU-CHEK GUIDE) test strip USE TO TEST UP TO 4 TIMES A DAY E11.9 (NOT USE INSULIN) (INS ONLY COVERS EVERY 90 DAYS)   pantoprazole (PROTONIX) 40 MG tablet TAKE 1 TABLET BY MOUTH EVERY DAY   pravastatin (PRAVACHOL) 10 mg, Oral, Daily   traMADol (ULTRAM) 50 MG tablet Take 1 every 4-6 hours as  needed for pain not controlled by Tylenol     Review of Systems    He reports no further episodes of dyspnea with steps.  He reports ongoing palpitations, head pressure, and hearing his heartbeat in his ears/elevated BP. He denies chest pain, pnd, orthopnea, n, v, dizziness, syncope, edema, weight gain, or early satiety.  All other systems reviewed and are otherwise negative except as noted above.  Physical Exam    VS:  BP 130/70 (BP Location: Left Arm, Patient Position: Sitting, Cuff Size: Normal)   Pulse 86   Ht _0  (1.803 m)   Wt 212  lb (96.2 kg)   SpO2 96%   BMI 29.57 kg/m  , BMI Body mass index is 29.57 kg/m. GEN: Well nourished, well developed, in no acute distress.  Joined by his wife HEENT: normal. Neck: Supple, no JVD, carotid bruits, or masses. Cardiac: RRR, 1/6 systolic murmur.  No rubs, or gallops. No clubbing, cyanosis.  Mild nonpitting bilateral edema.  Mild radials/DP/PT 2+ and equal bilaterally.  Respiratory:  Respirations regular and unlabored, clear to auscultation bilaterally. GI: Soft, nontender, nondistended, BS + x 4. MS: no deformity or atrophy. Skin: warm and dry, no rash. Neuro:  Strength and sensation are intact. Psych: Normal affect.  Accessory Clinical Findings    ECG personally reviewed by me today -No EKG- no acute changes.  VITALS Reviewed today   Temp Readings from Last 3 Encounters:  01/30/21 (!) 97.5 F (36.4 C)  12/18/20 (!) 97.4 F (36.3 C) (Temporal)  10/07/20 (!) 97.3 F (36.3 C) (Temporal)   BP Readings from Last 3 Encounters:  02/13/21 130/70  01/30/21 115/61  12/18/20 120/72   Pulse Readings from Last 3 Encounters:  02/13/21 86  01/30/21 (!) 54  12/18/20 76    Wt Readings from Last 3 Encounters:  02/13/21 212 lb (96.2 kg)  01/30/21 211 lb (95.7 kg)  12/18/20 210 lb (95.3 kg)     LABS  reviewed today   Lab Results  Component Value Date   WBC 9.8 10/07/2020   HGB 14.0 10/07/2020   HCT 42.1 10/07/2020   MCV 93.1  10/07/2020   PLT 192.0 10/07/2020   Lab Results  Component Value Date   CREATININE 1.41 10/07/2020   BUN 26 (H) 10/07/2020   NA 142 10/07/2020   K 4.2 10/07/2020   CL 107 10/07/2020   CO2 25 10/07/2020   Lab Results  Component Value Date   ALT 20 10/07/2020   AST 14 10/07/2020   ALKPHOS 42 10/07/2020   BILITOT 0.5 10/07/2020   Lab Results  Component Value Date   CHOL 113 10/07/2020   HDL 41.60 10/07/2020   LDLCALC 40 10/07/2020   TRIG 159.0 (H) 10/07/2020   CHOLHDL 3 10/07/2020    Lab Results  Component Value Date   HGBA1C 7.0 (A) 12/18/2020   Lab Results  Component Value Date   TSH 1.36 10/07/2020     STUDIES/PROCEDURES reviewed today   Echo 11/2020  1. Left ventricular ejection fraction, by estimation, is 60 to 65%. The  left ventricle has normal function. The left ventricle has no regional  wall motion abnormalities. Left ventricular diastolic parameters are  indeterminate. The average left  ventricular global longitudinal strain is -14.2 %.   2. Right ventricular systolic function is normal. The right ventricular  size is normal. There is normal pulmonary artery systolic pressure. The  estimated right ventricular systolic pressure is 27.2 mmHg.   3. Left atrial size was mildly dilated.   4. The mitral valve is normal in structure. Mild mitral valve  regurgitation.   MPI 10/21/2020 Pharmacological myocardial perfusion imaging study with no significant  ischemia Normal wall motion, EF estimated at 54% No EKG changes concerning for ischemia at peak stress or in recovery. CT attenuation correction images with no significant coronary calcification or aortic atherosclerosis Low risk scan  Assessment & Plan    Coronary artery disease without angina -- No chest pain.  MPI low risk.  Echo with EF 60 to 65%, NR WMA, RVSP 31.0 mmHg.  Continue amlodipine 10 mg daily. Discussed adding Losartan  71m qd today for further BP support. Pt first wishes to try to reduce  salt intake and monitor fluids. Reassess addition of ARB at RTC. Continue statin with possible addition of Vascepa at RTC.  Heart healthy diet/activity and lifestyle changes reviewed.    Essential hypertension, goal BP 130/80 or lower -- BP today suboptimal, despite increase to amlodipine 10 mg daily.  Discussed Losartan. Pt preference is to defer ARB at this time and instead try to reduce salt/fluids. Reassess at RTC with preference to add ARB if room in BP given DM2.  Continue to monitor BP/wt at home with guidelines for goal SBP 130/80 or lower. Reviewed possible sources of salt. .  Tachypalpitations  -- Reports some tachypalpitations, previously improved with BP support. Recommend reassess start of Losartan 241mdaily at RTC if agreeable as above. Continue amlodipine.   HLD with goal LDL <70, Hypertriglyceridemia -- Continue statin. Could consider Vascepa if ongoing elevated Tg. Per pt preference, we will again attempt lifestyle changes with increased activity and diet changes.Reassess at RTC.  DM2 -- Hemoglobin A1c 7.0.  Tx and management per PCP. Recommend reassess addition of ARB at RTC given comorbid DM2 as above and if room in pressure.  CKD -- BP and glycemic control recommended moving forward for cardiovascular and renal health. Discussed that ARB is renal protective today.  GERD -- Denies any symptoms of reflux.  Continue to monitor. Continue PPI.   Nicotine use -- Complete cessation of all nicotine recommended for risk factor modification.  Medication changes: Deferred start of Losartan 2515maily. Deferred start of Vascepa. Future considerations: Diet changes-->Vascepa if Tg still elevated / Losartan if still room in BP given comorbid DM2 Disposition: RTC 6 mo. Recommend start of Losartan by PCP if room in BP at upcoming visits and pt agreeable. Consider also start of Vascepa at that time.   *Please be aware that the above documentation was completed voice recognition  software and may contain dictation errors.    JacArvil ChacoA-C 02/13/2021

## 2021-02-16 ENCOUNTER — Encounter: Payer: Self-pay | Admitting: Physician Assistant

## 2021-02-23 LAB — HM DIABETES EYE EXAM

## 2021-02-24 DIAGNOSIS — E119 Type 2 diabetes mellitus without complications: Secondary | ICD-10-CM | POA: Diagnosis not present

## 2021-03-15 NOTE — Progress Notes (Signed)
Subjective:   Brian Hull. is a 70 y.o. male who presents for Medicare Annual/Subsequent preventive examination.  I connected with Helene Kelp today by telephone and verified that I am speaking with the correct person using two identifiers. Location patient: home Location provider: work Persons participating in the virtual visit: patient, Marine scientist.    I discussed the limitations, risks, security and privacy concerns of performing an evaluation and management service by telephone and the availability of in person appointments. I also discussed with the patient that there may be a patient responsible charge related to this service. The patient expressed understanding and verbally consented to this telephonic visit.    Interactive audio and video telecommunications were attempted between this provider and patient, however failed, due to patient having technical difficulties OR patient did not have access to video capability.  We continued and completed visit with audio only.  Some vital signs may be absent or patient reported.   Time Spent with patient on telephone encounter: 25 minutes  Review of Systems     Cardiac Risk Factors include: advanced age (>60mn, >>68women);diabetes mellitus;hypertension     Objective:    Today's Vitals   03/17/21 0942  Weight: 212 lb (96.2 kg)  Height: _0  (1.803 m)   Body mass index is 29.57 kg/m.  Advanced Directives 03/17/2021 03/14/2020 03/14/2019 03/10/2018  Does Patient Have a Medical Advance Directive? No No No No  Does patient want to make changes to medical advance directive? - No - Patient declined - -  Would patient like information on creating a medical advance directive? Yes (MAU/Ambulatory/Procedural Areas - Information given) - No - Patient declined Yes (MAU/Ambulatory/Procedural Areas - Information given)    Current Medications (verified) Outpatient Encounter Medications as of 03/17/2021  Medication Sig   Accu-Chek Softclix  Lancets lancets USE TO TEST UP 4 TIMES A DAY (E11.9- NOT USING INSULIN)   amLODipine (NORVASC) 10 MG tablet Take 1 tablet (10 mg total) by mouth daily.   blood glucose meter kit and supplies Dispense based on patient and insurance preference. Use up to four times daily as directed. (FOR ICD-10 E10.9, E11.9).   glucose blood (ACCU-CHEK GUIDE) test strip USE TO TEST UP TO 4 TIMES A DAY E11.9 (NOT USE INSULIN) (INS ONLY COVERS EVERY 90 DAYS)   pantoprazole (PROTONIX) 40 MG tablet TAKE 1 TABLET BY MOUTH EVERY DAY   pravastatin (PRAVACHOL) 10 MG tablet Take 1 tablet (10 mg total) by mouth daily.   No facility-administered encounter medications on file as of 03/17/2021.    Allergies (verified) Azithromycin, [name], [name]   History: Past Medical History:  Diagnosis Date   Amputation, thumb, traumatic 1968   tip amputated    Barrett's esophagus 05/08/02, 02/15/05, 2009   EGD- Barrett's esoph, HH, gastritis, diverticulitis   Basal cell cancer 05/2000   of nose, face (seen at [facility] 2015)   Diabetes mellitus without complication (HCC)    Diet controlled   [place]    Esophageal reflux    History of colon polyps    Hypertension    controlled on meds   Lipoma of unspecified site    PONV (postoperative nausea and vomiting)    as a 70yr old   Pure hypercholesterolemia    Tobacco use disorder    uses snuff   Unspecified gastritis and gastroduodenitis without mention of hemorrhage    Wears dentures    upper   Wrist fracture, left    as  a child   Past Surgical History:  Procedure Laterality Date   BROW LIFT Right 01/30/2021   Procedure: BLEPHAROPLASTY UPPER EYELID; W/EXCESS SKIN BLEPHAROPTOSIS REPAIR; RESECT SKIN RIGHT;  Surgeon: Karle Starch, MD;  Location: [facility];  Service: Ophthalmology;  Laterality: Right;   CATARACT EXTRACTION     2012   CHOLECYSTECTOMY, LAPAROSCOPIC  12/20/06   [place]   EYE SURGERY     trauma to right eye as a child, growth removed  from eye 2 years later   Eyelid surgery Bilateral 06/2020   LAMINECTOMY  1984   L 4/5   UPPER GI ENDOSCOPY  9/205   Family History  Problem Relation Age of Onset   Diabetes Mother    Heart disease Mother        MI   Alzheimer's disease Mother        progressing   Cancer Father        possible colon   Diabetes Father    Heart disease Father        CHF   Stroke Father    Hypertension Father    Colon cancer Father        possible dx   Hypertension Sister    Colon polyps Sister    Cancer Sister        breast cancer   Cancer Brother        lung- smoker   Diabetes Brother    Colon polyps Brother    Hypertension Sister    Colon polyps Sister    Depression Neg Hx    Alcohol abuse Neg Hx    Drug abuse Neg Hx    Prostate cancer Neg Hx    Social History   Socioeconomic History   Marital status: Married    Spouse name: Not on file   Number of children: 3   Years of education: Not on file   Highest education level: Not on file  Occupational History   Occupation: retired IT trainer, on disability for back pain  Tobacco Use   Smoking status: Former    Packs/day: 1.00    Years: 55.00    Pack years: 55.00    Types: Cigarettes    Quit date: 04/10/2017    Years since quitting: 3.9   Smokeless tobacco: Current    Types: Snuff  Vaping Use   Vaping Use: Former  Substance and Sexual Activity   Alcohol use: No    Alcohol/week: 0.0 standard drinks   Drug use: No   Sexual activity: Not on file  Other Topics Concern   Not on file  Social History Narrative   3 children initially- 2 sons locally, one daughter died at age 35 from tetrology of fallot.   [employer] [phone], domestic   Married 1970   Social Determinants of Radio broadcast assistant Strain: Low Risk    Difficulty of Paying Living Expenses: Not hard at all  Food Insecurity: No Food Insecurity   Worried About Charity fundraiser in the Last Year: Never true   Arboriculturist in the Last Year: Never true   Transportation Needs: No Transportation Needs   Lack of Transportation (Medical): No   Lack of Transportation (Non-Medical): No  Physical Activity: Inactive   Days of Exercise per Week: 0 days   Minutes of Exercise per Session: 0 min  Stress: No Stress Concern Present   Feeling of Stress : Not at all  Social Connections: Moderately Isolated  Frequency of Communication with Friends and Family: Never   Frequency of Social Gatherings with Friends and Family: Once a week   Attends Religious Services: More than 4 times per year   Active Member of [employer] or Organizations: No   Attends Music therapist: Never   Marital Status: Married    Tobacco Counseling Ready to quit: Not Answered Counseling given: Not Answered   Clinical Intake:  Pre-visit preparation completed: Yes  Pain : No/denies pain     BMI - recorded: 29.58 Nutritional Status: BMI 25 -29 Overweight Nutritional Risks: None Diabetes: Yes CBG done?: No (visit completed over the phone) Did pt. bring in CBG monitor from home?: No  How often do you need to have someone help you when you read instructions, pamphlets, or other written materials from your doctor or pharmacy?: 1 - Never  Diabetes:  Is the patient diabetic?  Yes  If diabetic, was a CBG obtained today?  No , visit completed over the phone. Did the patient bring in their glucometer from home?  No , visit completed over the phone. How often do you monitor your CBG's? 3 times per week.   Financial Strains and Diabetes Management:  Are you having any financial strains with the device, your supplies or your medication? No .  Does the patient want to be seen by Chronic Care Management for management of their diabetes?  No  Would the patient like to be referred to a Nutritionist or for Diabetic Management?  No   Diabetic Exams:  Diabetic Eye Exam: Completed 02/2021.  Diabetic Foot Exam: Due,Pt has been advised about the importance in completing  this exam. Pt is scheduled for diabetic foot exam on 03/23/21.    Interpreter Needed?: No  Information entered by :: Orrin Brigham LPN   Activities of Daily Living In your present state of health, do you have any difficulty performing the following activities: 03/17/2021  Hearing? Y  Comment patient states tv sounds lower  Vision? N  Difficulty concentrating or making decisions? N  Walking or climbing stairs? N  Dressing or bathing? N  Doing errands, shopping? N  Preparing Food and eating ? N  Using the Toilet? N  In the past six months, have you accidently leaked urine? N  Do you have problems with loss of bowel control? N  Managing your Medications? N  Managing your Finances? N  Housekeeping or managing your Housekeeping? N  Some recent data might be hidden    Patient Care Team: Tonia Ghent, MD as PCP - General Rockey Situ Kathlene November, MD as PCP - Cardiology (Cardiology)  Indicate any recent Medical Services you may have received from other than [facility] providers in the past year (date may be approximate).     Assessment:   This is a routine wellness examination for [place].  Hearing/Vision screen Hearing Screening - Comments:: Patient states tv sounds lower than usual Vision Screening - Comments:: Last eye exam 02/2021, Dr. Glennon Mac   Dietary issues and exercise activities discussed: Current Exercise Habits: The patient does not participate in regular exercise at present   Goals Addressed             This Visit's Progress    Patient Stated       Would like to maintain a low carb diet and start walking.        Depression Screen PHQ 2/9 Scores 03/17/2021 03/14/2020 03/14/2019 03/10/2018 03/08/2017 03/04/2016 03/03/2015  PHQ - 2 Score 0 0 0 0  0 0 0  PHQ- 9 Score - 0 0 0 - - -    Fall Risk Fall Risk  03/17/2021 03/14/2020 04/03/2019 03/14/2019 03/10/2018  Falls in the past year? 0 0 0 0 0  Comment - - Emmi Telephone Survey: data to providers prior to load - -  Number  falls in past yr: 0 0 - 0 -  Injury with Fall? 0 0 - 0 -  Risk for fall due to : No Fall Risks No Fall Risks - - -  Follow up - Falls evaluation completed;Falls prevention discussed - Falls evaluation completed;Falls prevention discussed -    FALL RISK PREVENTION PERTAINING TO THE HOME:  Any stairs in or around the home? Yes  If so, are there any without handrails? No  Home free of loose throw rugs in walkways, pet beds, electrical cords, etc? Yes  Adequate lighting in your home to reduce risk of falls? Yes   ASSISTIVE DEVICES UTILIZED TO PREVENT FALLS:  Life alert? No  Use of a cane, walker or w/c? No  Grab bars in the bathroom? No  Shower chair or bench in shower? No  Elevated toilet seat or a handicapped toilet? Yes   TIMED UP AND GO:  Was the test performed? No , visit completed over the phone.    Cognitive Function: Normal cognitive status assessed by this Nurse Health Advisor. No abnormalities found.   MMSE - Mini Mental State Exam 03/14/2020 03/14/2019 03/10/2018  Orientation to time _0 Orientation to Place _1 Registration _2 Attention/ Calculation 5 5 0  Recall _3 Language- name 2 objects - - 0  Language- repeat _4 Language- follow 3 step command - - 3  Language- read & follow direction - - 0  Write a sentence - - 0  Copy design - - 0  Total score - - 20        Immunizations Immunization History  Administered Date(s) Administered   Fluad Quad(high Dose 65+) 01/09/2019, 03/17/2020   Influenza Split 02/14/2012, 02/15/2014   Influenza,inj,Quad PF,6+ Mos 02/27/2013, 03/03/2015, 03/04/2016, 03/08/2017, 02/09/2018   PFIZER(Purple Top)SARS-COV-2 Vaccination 07/24/2019, 08/14/2019   Pneumococcal Conjugate-13 03/03/2015   Pneumococcal Polysaccharide-23 03/04/2016   Td 05/11/1996, 01/25/2007   Zoster, Live 05/21/2011    TDAP status: Due, Education has been provided regarding the importance of this vaccine. Advised may receive this vaccine at  local pharmacy or Health Dept. Aware to provide a copy of the vaccination record if obtained from local pharmacy or Health Dept. Verbalized acceptance and understanding.  Flu Vaccine status: Due, Education has been provided regarding the importance of this vaccine. Advised may receive this vaccine at local pharmacy or Health Dept. Aware to provide a copy of the vaccination record if obtained from local pharmacy or Health Dept. Verbalized acceptance and understanding.  Pneumococcal vaccine status: Up to date  Covid-19 vaccine status: Declined, Education has been provided regarding the importance of this vaccine but patient still declined. Advised may receive this vaccine at local pharmacy or Health Dept.or vaccine clinic. Aware to provide a copy of the vaccination record if obtained from local pharmacy or Health Dept. Verbalized acceptance and understanding.  Qualifies for Shingles Vaccine? Yes   Zostavax completed No   Shingrix Completed?: No.    Education has been provided regarding the importance of this vaccine. Patient has been advised to call insurance company to determine out of pocket expense if they have  not yet received this vaccine. Advised may also receive vaccine at local pharmacy or Health Dept. Verbalized acceptance and understanding.  Screening Tests Health Maintenance  Topic Date Due   Zoster Vaccines- Shingrix (1 of 2) Never done   COVID-19 Vaccine (3 - Pfizer risk series) 09/11/2019   INFLUENZA VACCINE  12/08/2020   OPHTHALMOLOGY EXAM  02/19/2021   URINE MICROALBUMIN  03/13/2021   FOOT EXAM  03/17/2021   TETANUS/TDAP  03/14/2024 (Originally 01/24/2017)   HEMOGLOBIN A1C  06/20/2021   COLONOSCOPY (Pts 45-86yr Insurance coverage will need to be confirmed)  03/01/2022   Pneumonia Vaccine 70 Years old  Completed   Hepatitis C Screening  Completed   HPV VACCINES  Aged Out    Health Maintenance  Health Maintenance Due  Topic Date Due   Zoster Vaccines- Shingrix (1 of 2)  Never done   COVID-19 Vaccine (3 - Pfizer risk series) 09/11/2019   INFLUENZA VACCINE  12/08/2020   OPHTHALMOLOGY EXAM  02/19/2021   URINE MICROALBUMIN  03/13/2021   FOOT EXAM  03/17/2021    Colorectal cancer screening: Type of screening: Colonoscopy. Completed 03/01/17. Repeat every 5 years  Lung Cancer Screening: (Low Dose CT Chest recommended if Age 72-80years, 30 pack-year currently smoking OR have quit w/in 15years.) does qualify.   Lung Cancer Screening Referral: N/A , screening completed 11/04/20  Additional Screening:  Hepatitis C Screening: does qualify; Completed 09/01/15  Vision Screening: Recommended annual ophthalmology exams for early detection of glaucoma and other disorders of the eye. Is the patient up to date with their annual eye exam?  Yes  Who is the provider or what is the name of the office in which the patient attends annual eye exams? Dr.Glennon Mac   Dental Screening: Recommended annual dental exams for proper oral hygiene  Community Resource Referral / Chronic Care Management: CRR required this visit?  No   CCM required this visit?  No      Plan:     I have personally reviewed and noted the following in the patient's chart:   Medical and social history Use of alcohol, tobacco or illicit drugs  Current medications and supplements including opioid prescriptions. Patient is not currently taking opioid prescriptions. Functional ability and status Nutritional status Physical activity Advanced directives List of other physicians Hospitalizations, surgeries, and ER visits in previous 12 months Vitals Screenings to include cognitive, depression, and falls Referrals and appointments  In addition, I have reviewed and discussed with patient certain preventive protocols, quality metrics, and best practice recommendations. A written personalized care plan for preventive services as well as general preventive health recommendations were provided to  patient.   Due to this being a telephonic visit, the after visit summary with patients personalized plan was offered to patient via mail or my-chart. Patient would like to access on my-chart.  TLoma Messing LPN   [id number]  Nurse Health Advisor  Nurse Notes: None

## 2021-03-17 ENCOUNTER — Ambulatory Visit (INDEPENDENT_AMBULATORY_CARE_PROVIDER_SITE_OTHER): Payer: Medicare Other

## 2021-03-17 VITALS — Ht 71.0 in | Wt 212.0 lb

## 2021-03-17 DIAGNOSIS — Z0000 Encounter for general adult medical examination without abnormal findings: Secondary | ICD-10-CM | POA: Diagnosis not present

## 2021-03-17 NOTE — Patient Instructions (Signed)
[name] , Thank you for taking time to complete your Medicare Wellness Visit. I appreciate your ongoing commitment to your health goals. Please review the following plan we discussed and let me know if I can assist you in the future.   Screening recommendations/referrals: Colonoscopy: up to date, completed 03/01/17, due 03/01/22 Recommended yearly ophthalmology/optometry visit for glaucoma screening and checkup Recommended yearly dental visit for hygiene and checkup  Vaccinations: Influenza vaccine: Due-May obtain vaccine at our office or your local pharmacy.  Pneumococcal vaccine: up to date Tdap vaccine: due, last completed 01/25/07  contact your local pharmacy Shingles vaccine: Discuss with pharmacy   Covid-19: newest booster available at your local pharmacy  Advanced directives: information available in office  Conditions/risks identified: see problem list  Next appointment: Follow up in one year for your annual wellness visit.   Preventive Care 31 Years and Older, Male Preventive care refers to lifestyle choices and visits with your health care provider that can promote health and wellness. What does preventive care include? A yearly physical exam. This is also called an annual well check. Dental exams once or twice a year. Routine eye exams. Ask your health care provider how often you should have your eyes checked. Personal lifestyle choices, including: Daily care of your teeth and gums. Regular physical activity. Eating a healthy diet. Avoiding tobacco and drug use. Limiting alcohol use. Practicing safe sex. Taking low doses of aspirin every day. Taking vitamin and mineral supplements as recommended by your health care provider. What happens during an annual well check? The services and screenings done by your health care provider during your annual well check will depend on your age, overall health, lifestyle risk factors, and family history of disease. Counseling   Your health care provider may ask you questions about your: Alcohol use. Tobacco use. Drug use. Emotional well-being. Home and relationship well-being. Sexual activity. Eating habits. History of falls. Memory and ability to understand (cognition). Work and work Statistician. Screening  You may have the following tests or measurements: Height, weight, and BMI. Blood pressure. Lipid and cholesterol levels. These may be checked every 5 years, or more frequently if you are over 35 years old. Skin check. Lung cancer screening. You may have this screening every year starting at age 72 if you have a 30-pack-year history of smoking and currently smoke or have quit within the past 15 years. Fecal occult blood test (FOBT) of the stool. You may have this test every year starting at age 72. Flexible sigmoidoscopy or colonoscopy. You may have a sigmoidoscopy every 5 years or a colonoscopy every 10 years starting at age 72. Prostate cancer screening. Recommendations will vary depending on your family history and other risks. Hepatitis C blood test. Hepatitis B blood test. Sexually transmitted disease (STD) testing. Diabetes screening. This is done by checking your blood sugar (glucose) after you have not eaten for a while (fasting). You may have this done every 1-3 years. Abdominal aortic aneurysm (AAA) screening. You may need this if you are a current or former smoker. Osteoporosis. You may be screened starting at age 72 if you are at high risk. Talk with your health care provider about your test results, treatment options, and if necessary, the need for more tests. Vaccines  Your health care provider may recommend certain vaccines, such as: Influenza vaccine. This is recommended every year. Tetanus, diphtheria, and acellular pertussis (Tdap, Td) vaccine. You may need a Td booster every 10 years. Zoster vaccine. You may need this  after age 70. Pneumococcal 13-valent conjugate (PCV13) vaccine.  One dose is recommended after age 35. Pneumococcal polysaccharide (PPSV23) vaccine. One dose is recommended after age 24. Talk to your health care provider about which screenings and vaccines you need and how often you need them. This information is not intended to replace advice given to you by your health care provider. Make sure you discuss any questions you have with your health care provider. Document Released: 05/23/2015 Document Revised: 01/14/2016 Document Reviewed: 02/25/2015 Elsevier Interactive Patient Education  2017 [place] Prevention in the Home Falls can cause injuries. They can happen to people of all ages. There are many things you can do to make your home safe and to help prevent falls. What can I do on the outside of my home? Regularly fix the edges of walkways and driveways and fix any cracks. Remove anything that might make you trip as you walk through a door, such as a raised step or threshold. Trim any bushes or trees on the path to your home. Use bright outdoor lighting. Clear any walking paths of anything that might make someone trip, such as rocks or tools. Regularly check to see if handrails are loose or broken. Make sure that both sides of any steps have handrails. Any raised decks and porches should have guardrails on the edges. Have any leaves, snow, or ice cleared regularly. Use sand or salt on walking paths during winter. Clean up any spills in your garage right away. This includes oil or grease spills. What can I do in the bathroom? Use night lights. Install grab bars by the toilet and in the tub and shower. Do not use towel bars as grab bars. Use non-skid mats or decals in the tub or shower. If you need to sit down in the shower, use a plastic, non-slip stool. Keep the floor dry. Clean up any water that spills on the floor as soon as it happens. Remove soap buildup in the tub or shower regularly. Attach bath mats securely with double-sided  non-slip rug tape. Do not have throw rugs and other things on the floor that can make you trip. What can I do in the bedroom? Use night lights. Make sure that you have a light by your bed that is easy to reach. Do not use any sheets or blankets that are too big for your bed. They should not hang down onto the floor. Have a firm chair that has side arms. You can use this for support while you get dressed. Do not have throw rugs and other things on the floor that can make you trip. What can I do in the kitchen? Clean up any spills right away. Avoid walking on wet floors. Keep items that you use a lot in easy-to-reach places. If you need to reach something above you, use a strong step stool that has a grab bar. Keep electrical cords out of the way. Do not use floor polish or wax that makes floors slippery. If you must use wax, use non-skid floor wax. Do not have throw rugs and other things on the floor that can make you trip. What can I do with my stairs? Do not leave any items on the stairs. Make sure that there are handrails on both sides of the stairs and use them. Fix handrails that are broken or loose. Make sure that handrails are as long as the stairways. Check any carpeting to make sure that it is firmly attached to the  stairs. Fix any carpet that is loose or worn. Avoid having throw rugs at the top or bottom of the stairs. If you do have throw rugs, attach them to the floor with carpet tape. Make sure that you have a light switch at the top of the stairs and the bottom of the stairs. If you do not have them, ask someone to add them for you. What else can I do to help prevent falls? Wear shoes that: Do not have high heels. Have rubber bottoms. Are comfortable and fit you well. Are closed at the toe. Do not wear sandals. If you use a stepladder: Make sure that it is fully opened. Do not climb a closed stepladder. Make sure that both sides of the stepladder are locked into place. Ask  someone to hold it for you, if possible. Clearly Brian Hull and make sure that you can see: Any grab bars or handrails. First and last steps. Where the edge of each step is. Use tools that help you move around (mobility aids) if they are needed. These include: Canes. Walkers. Scooters. Crutches. Turn on the lights when you go into a dark area. Replace any light bulbs as soon as they burn out. Set up your furniture so you have a clear path. Avoid moving your furniture around. If any of your floors are uneven, fix them. If there are any pets around you, be aware of where they are. Review your medicines with your doctor. Some medicines can make you feel dizzy. This can increase your chance of falling. Ask your doctor what other things that you can do to help prevent falls. This information is not intended to replace advice given to you by your health care provider. Make sure you discuss any questions you have with your health care provider. Document Released: 02/20/2009 Document Revised: 10/02/2015 Document Reviewed: 05/31/2014 Elsevier Interactive Patient Education  2017 [employer].

## 2021-03-18 ENCOUNTER — Other Ambulatory Visit: Payer: Medicare Other

## 2021-03-23 ENCOUNTER — Encounter: Payer: Self-pay | Admitting: Family Medicine

## 2021-03-23 ENCOUNTER — Other Ambulatory Visit: Payer: Self-pay

## 2021-03-23 ENCOUNTER — Ambulatory Visit (INDEPENDENT_AMBULATORY_CARE_PROVIDER_SITE_OTHER): Payer: Medicare Other | Admitting: Family Medicine

## 2021-03-23 VITALS — BP 122/62 | HR 80 | Temp 97.6°F | Ht 71.0 in | Wt 212.0 lb

## 2021-03-23 DIAGNOSIS — I2510 Atherosclerotic heart disease of native coronary artery without angina pectoris: Secondary | ICD-10-CM

## 2021-03-23 DIAGNOSIS — Z7189 Other specified counseling: Secondary | ICD-10-CM

## 2021-03-23 DIAGNOSIS — I10 Essential (primary) hypertension: Secondary | ICD-10-CM

## 2021-03-23 DIAGNOSIS — Z23 Encounter for immunization: Secondary | ICD-10-CM | POA: Diagnosis not present

## 2021-03-23 DIAGNOSIS — E119 Type 2 diabetes mellitus without complications: Secondary | ICD-10-CM | POA: Diagnosis not present

## 2021-03-23 DIAGNOSIS — Z0000 Encounter for general adult medical examination without abnormal findings: Secondary | ICD-10-CM

## 2021-03-23 DIAGNOSIS — K22719 Barrett's esophagus with dysplasia, unspecified: Secondary | ICD-10-CM | POA: Diagnosis not present

## 2021-03-23 LAB — BASIC METABOLIC PANEL
BUN: 23 mg/dL (ref 6–23)
CO2: 29 mEq/L (ref 19–32)
Calcium: 9.2 mg/dL (ref 8.4–10.5)
Chloride: 106 mEq/L (ref 96–112)
Creatinine, Ser: 1.32 mg/dL (ref 0.40–1.50)
GFR: 54.13 mL/min — ABNORMAL LOW (ref >60.00)
Glucose, Bld: 131 mg/dL — ABNORMAL HIGH (ref 70–99)
Potassium: 4.6 mEq/L (ref 3.5–5.1)
Sodium: 141 mEq/L (ref 135–145)

## 2021-03-23 LAB — MICROALBUMIN / CREATININE URINE RATIO
Creatinine,U: 203.4 mg/dL
Microalb Creat Ratio: 0.3 mg/g (ref 0.0–30.0)
Microalb, Ur: <0.7 mg/dL (ref 0.0–1.9)

## 2021-03-23 LAB — HEMOGLOBIN A1C: Hgb A1c MFr Bld: 7.1 % — ABNORMAL HIGH (ref 4.6–6.5)

## 2021-03-23 NOTE — Progress Notes (Signed)
This visit occurred during the SARS-CoV-2 public health emergency.  Safety protocols were in place, including screening questions prior to the visit, additional usage of staff PPE, and extensive cleaning of exam room while observing appropriate contact time as indicated for disinfecting solutions.  History of CAD. Using medications without problems: yes Muscle aches: no, just occ mild leg aches that self resolve.   Diet compliance: yes Exercise: encouraged.   Prev lipids at goal.    Hypertension:    Using medication without problems or lightheadedness: yes Chest pain with exertion:no Edema:no Short of breath:no Diet and exercise d/w pt.    Diabetes:  No meds.  Hypoglycemic episodes: no Hyperglycemic episodes: no Feet problems: no Blood Sugars averaging: 120-130 usually, rarely up to 150 eye exam within last year: done last month- Dr. Glennon Mac at [facility].    Still on PPI at baseline for Barrett's.  Compliant.  Swallowing well.  No ADE on med.  No blood in stool.  No vomiting.  No dysphagia.    He has CT scanning of lungs ongoing, yearly.   Shingles and tetanus d/w pt.  See avs.  PNA up to date Flu 2022 covid vaccine 2021.   Prostate cancer screening and PSA options (with potential risks and benefits of testing vs not testing) were discussed along with recent recs/guidelines.  He declined testing PSA at this point.  Advance directive- wife designated if patient were incapacitated. Colonoscopy 2018.    PMH and SH reviewed  Meds, vitals, and allergies reviewed.   ROS: Per HPI unless specifically indicated in ROS section   GEN: nad, alert and oriented HEENT: ncat NECK: supple w/o LA CV: rrr. PULM: ctab, no inc wob ABD: soft, +bs EXT: no edema SKIN: Well-perfused.  Diabetic foot exam: Normal inspection No skin breakdown No calluses  Normal DP pulses Normal sensation to light touch and monofilament Nails normal except for R 1st nail thickened.

## 2021-03-23 NOTE — Patient Instructions (Signed)
Go to the lab on the way out.   If you have mychart we'll likely use that to update you.    Take care.  Glad to see you. Have the pharmacy let us know when you need refills.

## 2021-03-25 NOTE — Assessment & Plan Note (Signed)
Advance directive- wife designated if patient were incapacitated.  

## 2021-03-25 NOTE — Assessment & Plan Note (Signed)
Controlled.  Continue amlodipine.  Continue work on diet and exercise.

## 2021-03-25 NOTE — Assessment & Plan Note (Signed)
He has CT scanning of lungs ongoing, yearly.   Shingles and tetanus d/w pt.  See avs.  PNA up to date Flu 2022 covid vaccine 2021.   Prostate cancer screening and PSA options (with potential risks and benefits of testing vs not testing) were discussed along with recent recs/guidelines.  He declined testing PSA at this point.  Advance directive- wife designated if patient were incapacitated. Colonoscopy 2018.

## 2021-03-25 NOTE — Assessment & Plan Note (Signed)
Would continue statin.  Continue work on diet and exercise.  Previous lipids at goal.

## 2021-03-25 NOTE — Assessment & Plan Note (Signed)
Still on PPI at baseline for Barrett's.  Compliant.  Swallowing well.  No ADE on med.  No blood in stool.  No vomiting.  No dysphagia.  Would continue pantoprazole.

## 2021-03-25 NOTE — Assessment & Plan Note (Signed)
No medications to lower her sugar currently.  Continue work on diet and exercise.  See notes on labs.  Discussed rechecking A1c along with creatinine and microalbumin.

## 2021-05-07 ENCOUNTER — Other Ambulatory Visit: Payer: Self-pay | Admitting: Family Medicine

## 2021-05-13 ENCOUNTER — Telehealth: Payer: Self-pay | Admitting: Family Medicine

## 2021-05-13 NOTE — Telephone Encounter (Signed)
Pt wife called stating that pt tested positive for covid. Pt wife wanted to let Dr Damita Dunnings know that pt has a fever of 97.9 congestion in chest and a sore throat and runny nose.

## 2021-05-13 NOTE — Telephone Encounter (Signed)
Noted.  Agreed.  Thanks. 

## 2021-05-13 NOTE — Telephone Encounter (Signed)
Spoke with patient and his wife. Patient stated he has had sore throat, sneezing, chest congestion, and coughing since Thursday/Friday. He took covid test and was positive but patient is starting to feel better today. He has been taking musinex and drinking fluids for treatment. I advised patient he is out of the treatment window currently but is good news he is feeling better now. Patient is going to continue with his current treatment since that was helping and advised to call back if needed/or sx worsen.

## 2021-06-25 DIAGNOSIS — L570 Actinic keratosis: Secondary | ICD-10-CM | POA: Diagnosis not present

## 2021-06-25 DIAGNOSIS — D225 Melanocytic nevi of trunk: Secondary | ICD-10-CM | POA: Diagnosis not present

## 2021-06-25 DIAGNOSIS — X32XXXA Exposure to sunlight, initial encounter: Secondary | ICD-10-CM | POA: Diagnosis not present

## 2021-06-25 DIAGNOSIS — C4441 Basal cell carcinoma of skin of scalp and neck: Secondary | ICD-10-CM | POA: Diagnosis not present

## 2021-06-25 DIAGNOSIS — D485 Neoplasm of uncertain behavior of skin: Secondary | ICD-10-CM | POA: Diagnosis not present

## 2021-06-25 DIAGNOSIS — Z85828 Personal history of other malignant neoplasm of skin: Secondary | ICD-10-CM | POA: Diagnosis not present

## 2021-06-25 DIAGNOSIS — D2261 Melanocytic nevi of right upper limb, including shoulder: Secondary | ICD-10-CM | POA: Diagnosis not present

## 2021-06-25 DIAGNOSIS — D2262 Melanocytic nevi of left upper limb, including shoulder: Secondary | ICD-10-CM | POA: Diagnosis not present

## 2021-09-10 DIAGNOSIS — C4441 Basal cell carcinoma of skin of scalp and neck: Secondary | ICD-10-CM | POA: Diagnosis not present

## 2021-09-10 DIAGNOSIS — D234 Other benign neoplasm of skin of scalp and neck: Secondary | ICD-10-CM | POA: Diagnosis not present

## 2021-09-22 ENCOUNTER — Ambulatory Visit (INDEPENDENT_AMBULATORY_CARE_PROVIDER_SITE_OTHER): Payer: Medicare Other | Admitting: Family Medicine

## 2021-09-22 ENCOUNTER — Encounter: Payer: Self-pay | Admitting: Family Medicine

## 2021-09-22 VITALS — BP 130/62 | HR 71 | Temp 97.3°F | Ht 71.0 in | Wt 206.0 lb

## 2021-09-22 DIAGNOSIS — E119 Type 2 diabetes mellitus without complications: Secondary | ICD-10-CM | POA: Diagnosis not present

## 2021-09-22 DIAGNOSIS — Z125 Encounter for screening for malignant neoplasm of prostate: Secondary | ICD-10-CM

## 2021-09-22 LAB — POCT GLYCOSYLATED HEMOGLOBIN (HGB A1C): Hemoglobin A1C: 6.4 % — AB (ref 4.0–5.6)

## 2021-09-22 NOTE — Patient Instructions (Addendum)
Plan on recheck in about 6 months with labs prior to the visit.  ?Take care.  Glad to see you. ?Update me as needed.   ?Thanks for your effort.   ?

## 2021-09-22 NOTE — Progress Notes (Signed)
Diabetes:  ?No meds for DM2 ?Hypoglycemic episodes: no ?Hyperglycemic episodes:no ?Feet problems:no ?Blood Sugars averaging:100-150 ?eye exam within last year: yes ?A1c done at [facility], d/w pt at [place]. 6.4.  ?Diet and exercise d/w pt.  Yardwork usually.  Wife is helping with low carb diet.   ?Gradual weight loss.   ? ?Meds, vitals, and allergies reviewed.  ?ROS: Per HPI unless specifically indicated in ROS section  ? ?GEN: nad, alert and oriented ?HEENT: ncat ?NECK: supple w/o LA ?CV: rrr. ?PULM: ctab, no inc wob ?ABD: soft, +bs ?EXT: no edema ?SKIN: Well-perfused. ?

## 2021-09-23 NOTE — Assessment & Plan Note (Addendum)
A1c done at [facility], d/w pt at [place].  6.4. ?Diet and exercise d/w pt.  Yardwork usually.  Wife is helping with low carb diet.   ?Gradual weight loss noted with diet and exercise.  I suspect that contributed to his A1c decrease.  Would continue as is.  We talked about checking routine labs at a yearly physical.  He want me to check a PSA.  We talked about pros and cons of that.  I ordered the test along with his other future labs. ?

## 2021-10-02 IMAGING — CT CT CHEST LUNG CANCER SCREENING LOW DOSE W/O CM
2 of 5 series · 15 of 40 positions shown, 18 images · non-contrast
Comparison: 10/26/2019

CLINICAL DATA: Lung cancer screening. Fifty-five pack-year history.
Former asymptomatic smoker.

EXAM:
CT CHEST WITHOUT CONTRAST LOW-DOSE FOR LUNG CANCER SCREENING
TECHNIQUE: Multidetector CT imaging of the chest was performed following the
standard protocol without IV contrast.

[Series 3: lung 1.00 · axial · 0.71mm/px · z∈[-1276,-920]mm · 12 of 392 slices shown, 15 images]
[im 18/392  mediastinal]
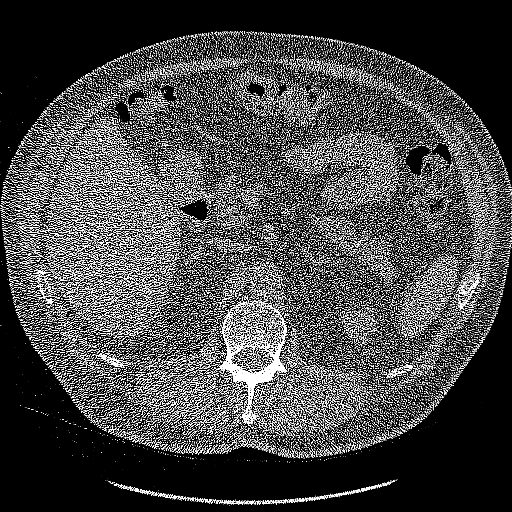
[im 18/392  lung]
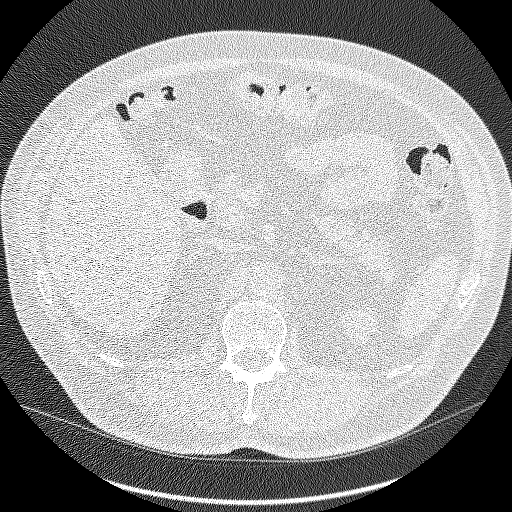
[im 54/392  lung]
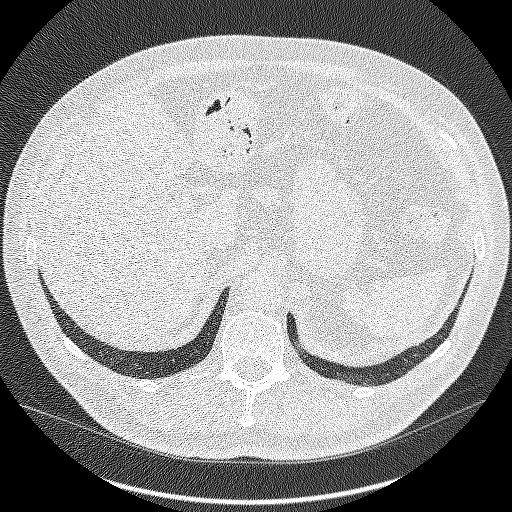
[im 89/392  lung]
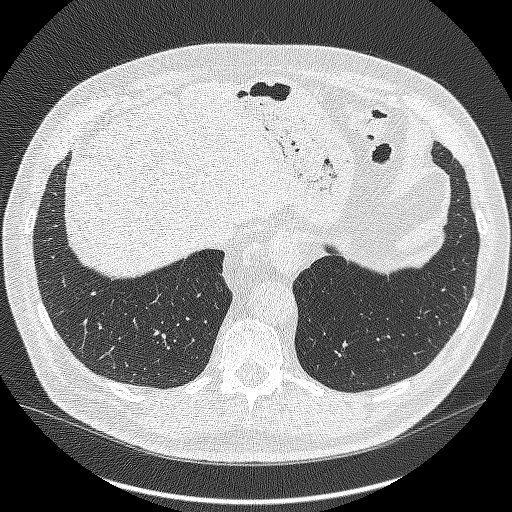
[im 125/392  lung]
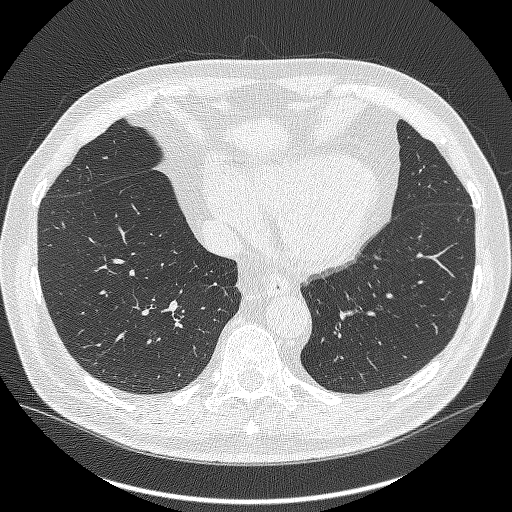
[im 143/392  mediastinal]
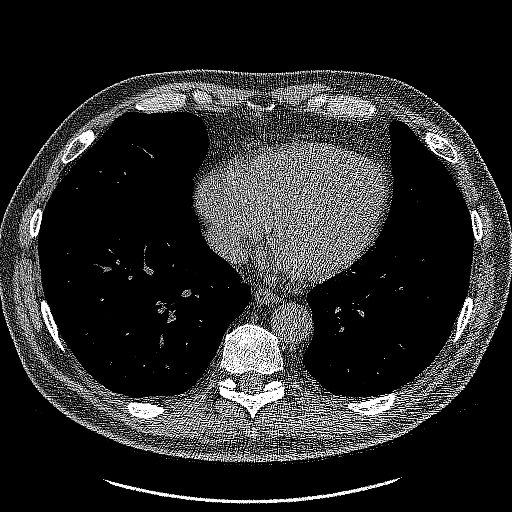
[im 143/392  lung]
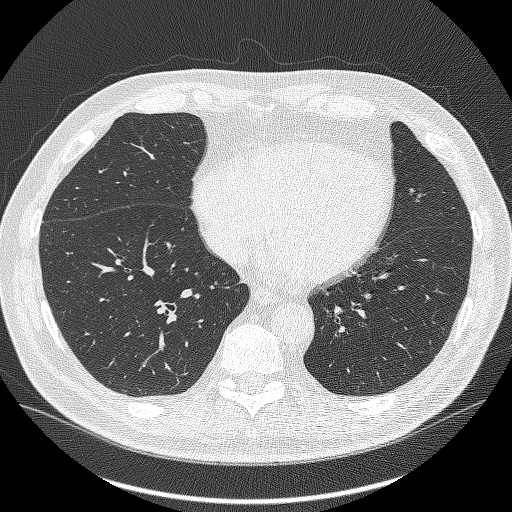
[im 178/392  lung]
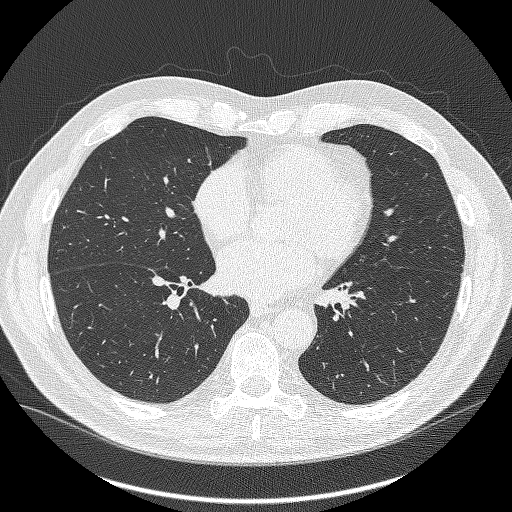
[im 214/392  lung]
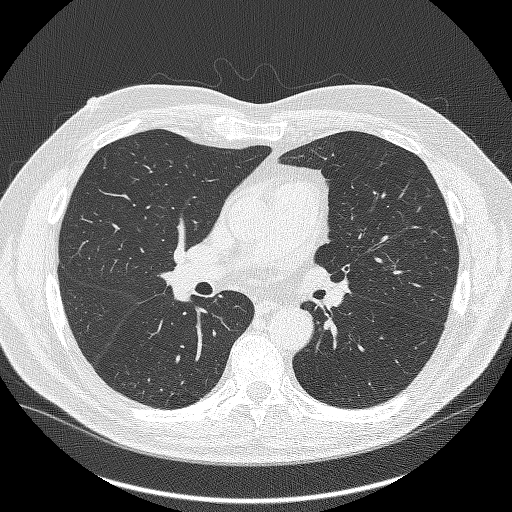
[im 249/392  lung]
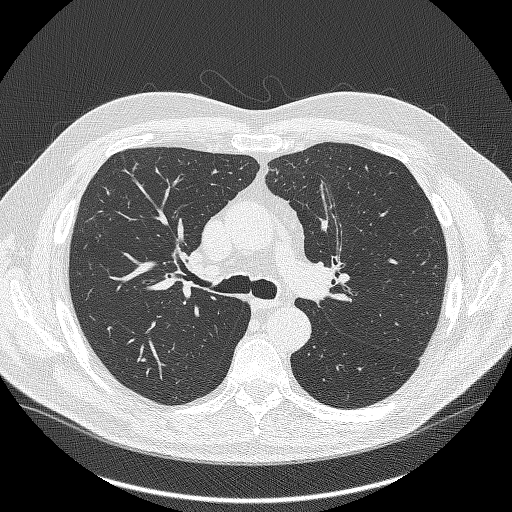
[im 267/392  mediastinal]
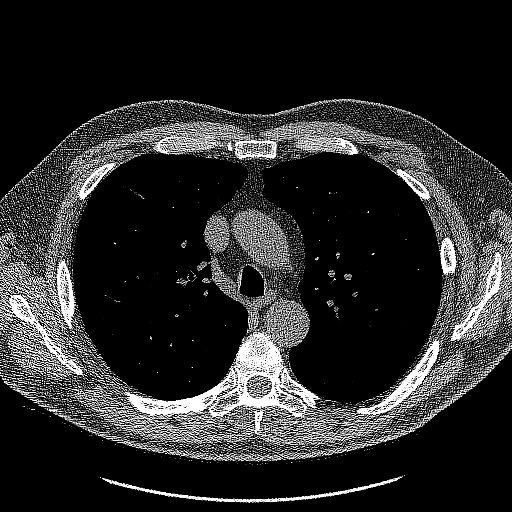
[im 267/392  lung]
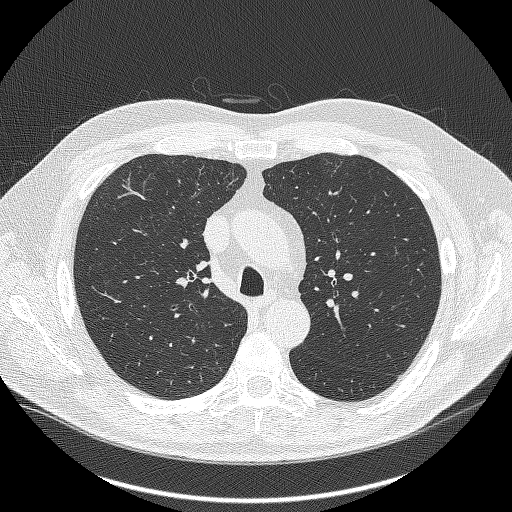
[im 303/392  lung]
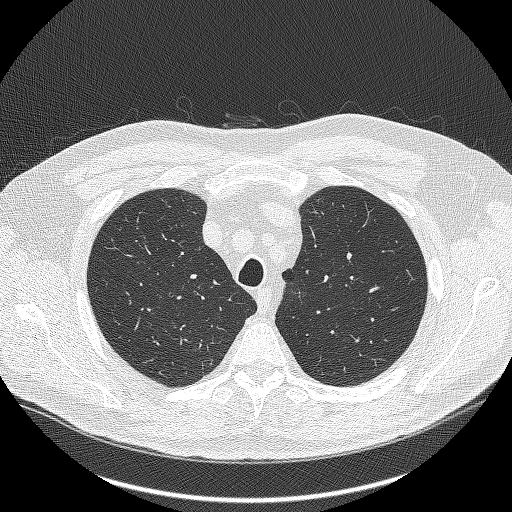
[im 338/392  lung]
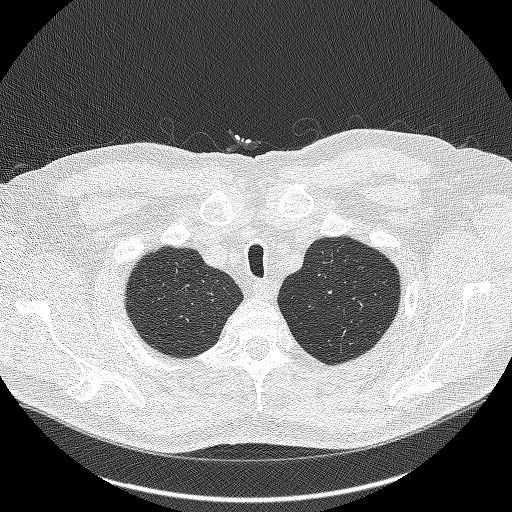
[im 374/392  lung]
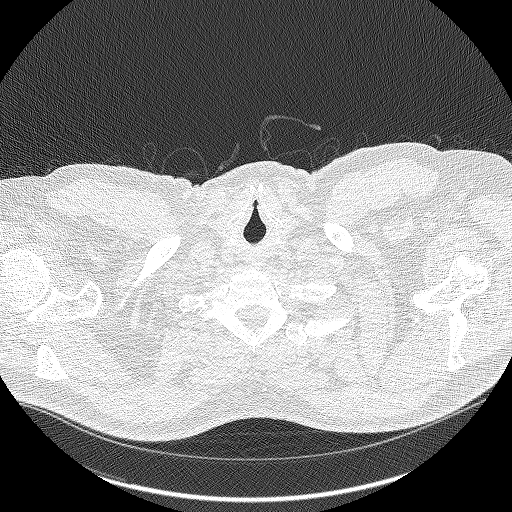

[Series 5: coronals lung 1.00 cor · coronal · 0.71mm/px · 3 of 304 slices shown]
[im 61/304  lung]
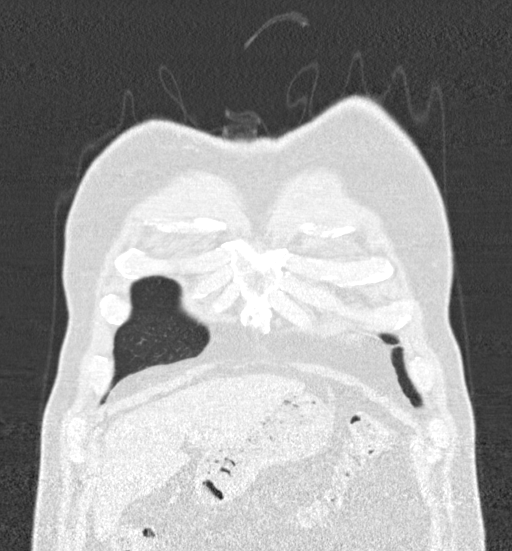
[im 122/304  lung]
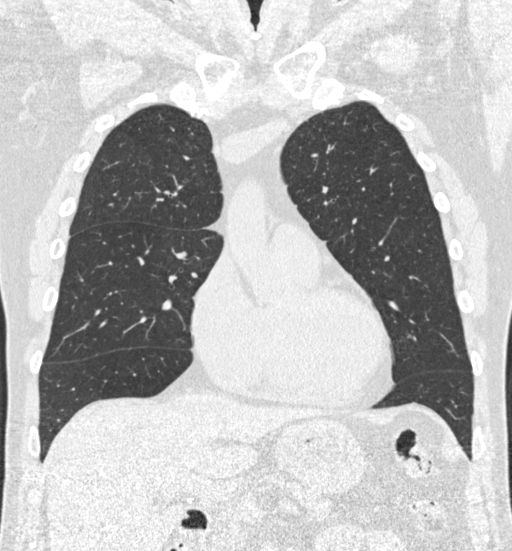
[im 182/304  lung]
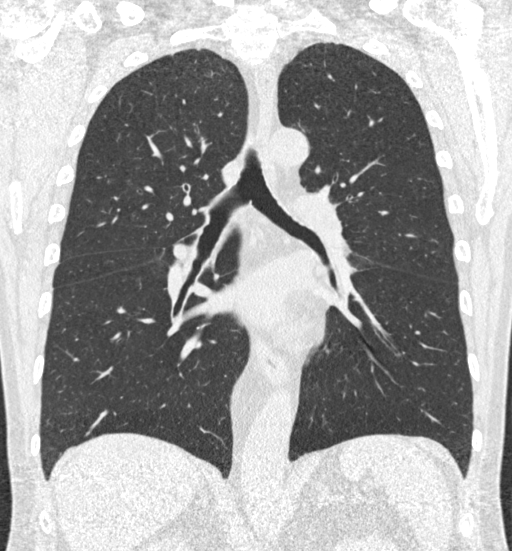

[15 of 40 positions shown; findings below may reference images not displayed]

FINDINGS: Cardiovascular: The heart size appears normal. No pericardial
effusion identified. Aortic atherosclerosis.

Mediastinum/Nodes: No enlarged mediastinal or hilar lymph nodes. No
supraclavicular or axillary lymph nodes. Normal appearance of the
thyroid gland. The trachea appears patent and is midline. Normal
appearance of the esophagus.

Lungs/Pleura: Diffuse bronchial wall thickening. Centrilobular
emphysema. No airspace consolidation, atelectasis, or pneumothorax.
Again seen are several small lung nodules. The largest is in the
posteromedial right upper lobe with a mean derived diameter is
mm. These are not significantly changed from previous exam.

Upper Abdomen: No acute abnormality within the imaged portions of
the upper abdomen.

Musculoskeletal: No chest wall mass or suspicious bone lesions
identified.
IMPRESSION: 1. Lung-RADS 2, benign appearance or behavior. Continue annual
screening with low-dose chest CT without contrast in 12 months.
2. Emphysema and aortic atherosclerosis.

Aortic Atherosclerosis (28KPK-PI6.6) and Emphysema ([id number].3).

## 2021-11-16 ENCOUNTER — Other Ambulatory Visit: Payer: Self-pay

## 2021-11-16 ENCOUNTER — Telehealth: Payer: Self-pay | Admitting: Acute Care

## 2021-11-16 DIAGNOSIS — Z122 Encounter for screening for malignant neoplasm of respiratory organs: Secondary | ICD-10-CM

## 2021-11-16 DIAGNOSIS — Z87891 Personal history of nicotine dependence: Secondary | ICD-10-CM

## 2021-11-18 NOTE — Telephone Encounter (Signed)
Brian Hull scheduled for patient

## 2021-11-27 ENCOUNTER — Ambulatory Visit
Admission: RE | Admit: 2021-11-27 | Discharge: 2021-11-27 | Disposition: A | Discharge status: Home / Self Care | Payer: Medicare Other | Source: Ambulatory Visit | Attending: Acute Care | Admitting: Acute Care

## 2021-11-27 DIAGNOSIS — Z122 Encounter for screening for malignant neoplasm of respiratory organs: Secondary | ICD-10-CM | POA: Insufficient documentation

## 2021-11-27 DIAGNOSIS — Z87891 Personal history of nicotine dependence: Secondary | ICD-10-CM | POA: Diagnosis not present

## 2021-11-30 ENCOUNTER — Other Ambulatory Visit: Payer: Self-pay | Admitting: Acute Care

## 2021-11-30 DIAGNOSIS — Z122 Encounter for screening for malignant neoplasm of respiratory organs: Secondary | ICD-10-CM

## 2021-11-30 DIAGNOSIS — Z87891 Personal history of nicotine dependence: Secondary | ICD-10-CM

## 2021-12-14 ENCOUNTER — Telehealth: Payer: Self-pay | Admitting: Cardiovascular Disease

## 2021-12-14 NOTE — Telephone Encounter (Signed)
Pt c/o BP issue: STAT if pt c/o blurred vision, one-sided weakness or slurred speech  1. What are your last 5 BP readings?  84/41 933/50 126/70 last week  102/62 129/65 today   2. Are you having any other symptoms (ex. Dizziness, headache, blurred vision, passed out)? Feels lightheaded, fatigued and nauseous.   3. What is your BP issue? She wants to know if he can stop taking his amlodipine, she states he is about 10lbs less than what he use to be.

## 2021-12-17 NOTE — Telephone Encounter (Signed)
Brian Merritts, MD    Would cut the norvasc in 1/2 (5 mg)  Have not seen him in 4 years  Needs f/u with provider  Thx  TG    Called and spoke with patient's wife, per DPR.   Went over recommendations.   Wife reports that patient has not taken the amlodipine for 2-3 days now, and they have been checking his BP every morning. His SBP has been in the 120s-130s, and they feel that with his weight loss, he may not need the amlodipine anymore.   I again advised that Dr. Rockey Situ recommends he cut it in half, but stated that if they felt more comfortable right now just checking his BP every morning and holding it, we could not make him take it and they could discuss it with Dr. Rockey Situ further at his scheduled f/u in October.   Pt's wife verbalized understanding of everything discussed and voiced appreciation for the call.

## 2021-12-24 DIAGNOSIS — D485 Neoplasm of uncertain behavior of skin: Secondary | ICD-10-CM | POA: Diagnosis not present

## 2021-12-24 DIAGNOSIS — D2261 Melanocytic nevi of right upper limb, including shoulder: Secondary | ICD-10-CM | POA: Diagnosis not present

## 2021-12-24 DIAGNOSIS — Z85828 Personal history of other malignant neoplasm of skin: Secondary | ICD-10-CM | POA: Diagnosis not present

## 2021-12-24 DIAGNOSIS — L570 Actinic keratosis: Secondary | ICD-10-CM | POA: Diagnosis not present

## 2021-12-24 DIAGNOSIS — C44619 Basal cell carcinoma of skin of left upper limb, including shoulder: Secondary | ICD-10-CM | POA: Diagnosis not present

## 2021-12-24 DIAGNOSIS — D225 Melanocytic nevi of trunk: Secondary | ICD-10-CM | POA: Diagnosis not present

## 2021-12-24 DIAGNOSIS — D2272 Melanocytic nevi of left lower limb, including hip: Secondary | ICD-10-CM | POA: Diagnosis not present

## 2022-02-15 NOTE — Progress Notes (Unsigned)
Cardiology Office Note  Date:  02/15/2022   ID:  Brian Haueter., DOB Apr 01, 1951, MRN [id number]  PCP:  Tonia Ghent, MD   No chief complaint on file.   HPI:  Brian Hull is a 71 year old male with past medical history of Former smoker 71 yo  To 2018, now using snuff, previously used vapor cigarettes Diabetes type 2, HBA1C 6.7,  Occasional shortness of breath Who presents by referral from Dr. Damita Dunnings for consultation of his coronary calcification on CT and shortness of breath, chest pain  Last seen in clinic by myself July 2019 Seen by one of our providers October 2022  He reports that he received a letter in the mail from oncology concerning coronary calcifications  Reports that is relatively active but no regular exercise program Rare episodes of chest pain.routinely and spot typically with exertion Feels they are somewhat atypical  Some shortness of breath but not on a flat surface with exertion sometimes happens with hills and stairs. Stable symptoms not getting worse Denies any significant cough, PND orthopnea or leg edema  Rare night sweats 3 times a year will soak the bed Etiology unclear  CT scan chest May 2019 and November 2018 Images pulled up in the office and reviewed with him in detail We could not find much significant coronary calcification or even aortic atherosclerosis, no carotid calcification Both CT scans were reviewed in detail Radiology report result was reviewed and again did not detail any aortic atherosclerosis or coronary calcification  EKG personally reviewed by myself on todays visit Shows normal sinus rhythm rate 67 bpm no significant ST or T-wave changes  Recent Vital Signs   There were no vitals taken for this visit.   Past Medical History:  Diagnosis Date   Amputation, thumb, traumatic 1968   tip amputated    Barrett's esophagus 05/08/02, 02/15/05, 2009   EGD- Barrett's esoph, HH, gastritis, diverticulitis   Basal cell cancer  05/2000   of nose, face (seen at [facility] 2015)   Diabetes mellitus without complication (HCC)    Diet controlled   Emphysema lung ([place])    Esophageal reflux    History of colon polyps    Hypertension    controlled on meds   Lipoma of unspecified site    PONV (postoperative nausea and vomiting)    as a 71 yr old   Pure hypercholesterolemia    Tobacco use disorder    uses snuff   Unspecified gastritis and gastroduodenitis without mention of hemorrhage    Wears dentures    upper   Wrist fracture, left    as a child    PMH:   has a past medical history of Amputation, thumb, traumatic (1968), Barrett's esophagus (05/08/02, 02/15/05, 2009), Basal cell cancer (05/2000), Diabetes mellitus without complication ([place]), Emphysema lung ([place]), Esophageal reflux, History of colon polyps, Hypertension, Lipoma of unspecified site, PONV (postoperative nausea and vomiting), Pure hypercholesterolemia, Tobacco use disorder, Unspecified gastritis and gastroduodenitis without mention of hemorrhage, Wears dentures, and Wrist fracture, left.  PSH:    Past Surgical History:  Procedure Laterality Date   BROW LIFT Right 01/30/2021   Procedure: BLEPHAROPLASTY UPPER EYELID; W/EXCESS SKIN BLEPHAROPTOSIS REPAIR; RESECT SKIN RIGHT;  Surgeon: Karle Starch, MD;  Location: [facility];  Service: Ophthalmology;  Laterality: Right;   CATARACT EXTRACTION     2012   CHOLECYSTECTOMY, LAPAROSCOPIC  12/20/06   [facility]   EYE SURGERY     trauma to right eye as a child,  growth removed from eye 2 years later   Eyelid surgery Bilateral 06/2020   LAMINECTOMY  1984   L 4/5   UPPER GI ENDOSCOPY  9/205    Current Outpatient Medications  Medication Sig Dispense Refill   Accu-Chek Softclix Lancets lancets USE TO TEST UP 4 TIMES A DAY (E11.9- NOT USING INSULIN) 100 each 11   amLODipine (NORVASC) 10 MG tablet Take 1 tablet (10 mg total) by mouth daily. 180 tablet 3   blood glucose meter kit and supplies Dispense based on  patient and insurance preference. Use up to four times daily as directed. (FOR ICD-10 E10.9, E11.9). 1 each 0   glucose blood (ACCU-CHEK GUIDE) test strip USE TO TEST UP TO 4 TIMES A DAY E11.9 (NOT USE INSULIN) (INS ONLY COVERS EVERY 90 DAYS) 100 strip 11   pantoprazole (PROTONIX) 40 MG tablet TAKE 1 TABLET BY MOUTH EVERY DAY 90 tablet 3   pravastatin (PRAVACHOL) 10 MG tablet TAKE 1 TABLET BY MOUTH EVERY DAY 90 tablet 3   No current facility-administered medications for this visit.     Allergies:   Azithromycin, [name], [name]   Social History:  The patient  reports that he quit smoking about 4 years ago. His smoking use included cigarettes. He has a 55.00 pack-year smoking history. His smokeless tobacco use includes snuff. He reports that he does not drink alcohol and does not use drugs.   Family History:   family history includes Alzheimer's disease in his mother; Cancer in his brother, father, and sister; Colon cancer in his father; Colon polyps in his brother, sister, and sister; Diabetes in his brother, father, and mother; Heart disease in his father and mother; Hypertension in his father, sister, and sister; Stroke in his father.    Review of Systems: Review of Systems  Constitutional: Negative.   Respiratory:  Positive for shortness of breath.   Cardiovascular: Negative.   Gastrointestinal: Negative.   Musculoskeletal: Negative.   Neurological: Negative.   Psychiatric/Behavioral: Negative.    All other systems reviewed and are negative.    PHYSICAL EXAM: VS:  There were no vitals taken for this visit. , BMI There is no height or weight on file to calculate BMI. GEN: Well nourished, well developed, in no acute distress  HEENT: normal  Neck: no JVD, carotid bruits, or masses Cardiac: RRR; no murmurs, rubs, or gallops,no edema  Respiratory:  clear to auscultation bilaterally, normal work of breathing GI: soft, nontender, nondistended, + BS MS: no deformity or atrophy   Skin: warm and dry, no rash Neuro:  Strength and sensation are intact Psych: euthymic mood, full affect    Recent Labs: 03/23/2021: BUN 23; Creatinine, Ser 1.32; Potassium 4.6; Sodium 141    Lipid Panel Lab Results  Component Value Date   CHOL 113 10/07/2020   HDL 41.60 10/07/2020   LDLCALC 40 10/07/2020   TRIG 159.0 (H) 10/07/2020      Wt Readings from Last 3 Encounters:  09/22/21 206 lb (93.4 kg)  03/23/21 212 lb (96.2 kg)  03/17/21 212 lb (96.2 kg)       ASSESSMENT AND PLAN:  Centrilobular emphysema (HCC) Long smoking history, stopped last year CT scans with minimal nodules Does not feel that he needs inhalers at this time  Coronary artery disease involving native heart without angina pectoris, unspecified vessel or lesion type - Plan: EKG 12-Lead There was mention of coronary calcification on left are from oncology concerning his CT scans No mention of coronary calcification  or aortic atherosclerosis in the reports of the CTs We have pulled up images and reviewed these in detail today and again no aortic atherosclerosis of carotid calcification or no significan tcoronary calcification LDL in the 40s No further workup needed  Diabetes mellitus without complication ([place]) - Plan: EKG 12-Lead We have encouraged continued exercise, careful diet management in an effort to lose weight. Hemoglobin A1c 6.5  Gastroesophageal reflux disease, esophagitis presence not specified Reports stable disease  History of hypertension Currently not on medications Recommended weight loss  NICOTINE ADDICTION Stop smoking last year, now using other nicotine supplements   Disposition:   F/U as needed   Total encounter time more than 60 minutes  Greater than 50% was spent in counseling and coordination of care with the patient    No orders of the defined types were placed in this encounter.    Signed, Esmond Plants, M.D., Ph.D. 02/15/2022  [place], [place]

## 2022-02-16 ENCOUNTER — Ambulatory Visit: Payer: Medicare Other | Attending: Cardiovascular Disease | Admitting: Cardiovascular Disease

## 2022-02-16 ENCOUNTER — Encounter: Payer: Self-pay | Admitting: Cardiovascular Disease

## 2022-02-16 VITALS — BP 128/60 | HR 54 | Ht 71.0 in | Wt 204.8 lb

## 2022-02-16 DIAGNOSIS — I10 Essential (primary) hypertension: Secondary | ICD-10-CM | POA: Insufficient documentation

## 2022-02-16 DIAGNOSIS — I2510 Atherosclerotic heart disease of native coronary artery without angina pectoris: Secondary | ICD-10-CM | POA: Insufficient documentation

## 2022-02-16 DIAGNOSIS — R0609 Other forms of dyspnea: Secondary | ICD-10-CM | POA: Diagnosis not present

## 2022-02-16 DIAGNOSIS — Z720 Tobacco use: Secondary | ICD-10-CM | POA: Insufficient documentation

## 2022-02-16 DIAGNOSIS — N189 Chronic kidney disease, unspecified: Secondary | ICD-10-CM | POA: Diagnosis not present

## 2022-02-16 DIAGNOSIS — E119 Type 2 diabetes mellitus without complications: Secondary | ICD-10-CM | POA: Diagnosis not present

## 2022-02-16 MED ORDER — AMLODIPINE BESYLATE 5 MG PO TABS: 5.0000 mg | ORAL_TABLET | Freq: Every day | ORAL | 3 refills | Status: DC

## 2022-02-16 NOTE — Patient Instructions (Addendum)
Medication Instructions:  Continue amlodipine 5 mg daily Monitor Blood pressure  If you need a refill on your cardiac medications before your next appointment, please call your pharmacy.   Lab work: No new labs needed  Testing/Procedures: No new testing needed  Follow-Up: At [facility], you and your health needs are our priority.  As part of our continuing mission to provide you with exceptional heart care, we have created designated Provider Care Teams.  These Care Teams include your primary Cardiologist (physician) and Advanced Practice Providers (APPs -  Physician Assistants and Nurse Practitioners) who all work together to provide you with the care you need, when you need it.  You will need a follow up appointment as needed Providers on your designated Care Team:   Brian Hodgkins, NP Brian Faith, PA-C Brian Hull, [place]  COVID-19 Vaccine Information can be found at: [email or web address] For questions related to vaccine distribution or appointments, please email [email or web address] or call [phone].

## 2022-02-22 DIAGNOSIS — C44619 Basal cell carcinoma of skin of left upper limb, including shoulder: Secondary | ICD-10-CM | POA: Diagnosis not present

## 2022-02-25 DIAGNOSIS — E119 Type 2 diabetes mellitus without complications: Secondary | ICD-10-CM | POA: Diagnosis not present

## 2022-02-25 LAB — HM DIABETES EYE EXAM

## 2022-03-08 DIAGNOSIS — H00012 Hordeolum externum right lower eyelid: Secondary | ICD-10-CM | POA: Diagnosis not present

## 2022-03-09 DIAGNOSIS — K5901 Slow transit constipation: Secondary | ICD-10-CM | POA: Diagnosis not present

## 2022-03-09 DIAGNOSIS — Z86010 Personal history of colonic polyps: Secondary | ICD-10-CM | POA: Diagnosis not present

## 2022-03-09 DIAGNOSIS — K219 Gastro-esophageal reflux disease without esophagitis: Secondary | ICD-10-CM | POA: Diagnosis not present

## 2022-03-18 ENCOUNTER — Ambulatory Visit (INDEPENDENT_AMBULATORY_CARE_PROVIDER_SITE_OTHER): Payer: Medicare Other

## 2022-03-18 VITALS — Ht 71.0 in | Wt 204.0 lb

## 2022-03-18 DIAGNOSIS — Z0000 Encounter for general adult medical examination without abnormal findings: Secondary | ICD-10-CM | POA: Diagnosis not present

## 2022-03-18 DIAGNOSIS — Z1211 Encounter for screening for malignant neoplasm of colon: Secondary | ICD-10-CM

## 2022-03-18 NOTE — Addendum Note (Signed)
Addended by: Dionisio David on: 03/18/2022 03:13 PM   Modules accepted: Orders

## 2022-03-18 NOTE — Patient Instructions (Signed)
Brian Hull , Thank you for taking time to come for your Medicare Wellness Visit. I appreciate your ongoing commitment to your health goals. Please review the following plan we discussed and let me know if I can assist you in the future.   Screening recommendations/referrals: Colonoscopy: 03/01/17 Recommended yearly ophthalmology/optometry visit for glaucoma screening and checkup Recommended yearly dental visit for hygiene and checkup  Vaccinations: Influenza vaccine: 03/23/21 Pneumococcal vaccine: 03/04/16 Tdap vaccine: had one in October Shingles vaccine: Zostavax 05/21/11   Shingrix  10/13/21, 12/13/21   Covid-19: 07/24/19, 08/14/19  Advanced directives: no  Conditions/risks identified: none  Next appointment: Follow up in one year for your annual wellness visit. 03/21/23 @ 2:45 pm by phone  Preventive Care 65 Years and Older, Male Preventive care refers to lifestyle choices and visits with your health care provider that can promote health and wellness. What does preventive care include? A yearly physical exam. This is also called an annual well check. Dental exams once or twice a year. Routine eye exams. Ask your health care provider how often you should have your eyes checked. Personal lifestyle choices, including: Daily care of your teeth and gums. Regular physical activity. Eating a healthy diet. Avoiding tobacco and drug use. Limiting alcohol use. Practicing safe sex. Taking low doses of aspirin every day. Taking vitamin and mineral supplements as recommended by your health care provider. What happens during an annual well check? The services and screenings done by your health care provider during your annual well check will depend on your age, overall health, lifestyle risk factors, and family history of disease. Counseling  Your health care provider may ask you questions about your: Alcohol use. Tobacco use. Drug use. Emotional well-being. Home and relationship  well-being. Sexual activity. Eating habits. History of falls. Memory and ability to understand (cognition). Work and work Statistician. Screening  You may have the following tests or measurements: Height, weight, and BMI. Blood pressure. Lipid and cholesterol levels. These may be checked every 5 years, or more frequently if you are over 59 years old. Skin check. Lung cancer screening. You may have this screening every year starting at age 35 if you have a 30-pack-year history of smoking and currently smoke or have quit within the past 15 years. Fecal occult blood test (FOBT) of the stool. You may have this test every year starting at age 23. Flexible sigmoidoscopy or colonoscopy. You may have a sigmoidoscopy every 5 years or a colonoscopy every 10 years starting at age 70. Prostate cancer screening. Recommendations will vary depending on your family history and other risks. Hepatitis C blood test. Hepatitis B blood test. Sexually transmitted disease (STD) testing. Diabetes screening. This is done by checking your blood sugar (glucose) after you have not eaten for a while (fasting). You may have this done every 1-3 years. Abdominal aortic aneurysm (AAA) screening. You may need this if you are a current or former smoker. Osteoporosis. You may be screened starting at age 86 if you are at high risk. Talk with your health care provider about your test results, treatment options, and if necessary, the need for more tests. Vaccines  Your health care provider may recommend certain vaccines, such as: Influenza vaccine. This is recommended every year. Tetanus, diphtheria, and acellular pertussis (Tdap, Td) vaccine. You may need a Td booster every 10 years. Zoster vaccine. You may need this after age 30. Pneumococcal 13-valent conjugate (PCV13) vaccine. One dose is recommended after age 18. Pneumococcal polysaccharide (PPSV23) vaccine. One dose is  recommended after age 60. Talk to your health care  provider about which screenings and vaccines you need and how often you need them. This information is not intended to replace advice given to you by your health care provider. Make sure you discuss any questions you have with your health care provider. Document Released: 05/23/2015 Document Revised: 01/14/2016 Document Reviewed: 02/25/2015 Elsevier Interactive Patient Education  2017 [name] Prevention in the Home Falls can cause injuries. They can happen to people of all ages. There are many things you can do to make your home safe and to help prevent falls. What can I do on the outside of my home? Regularly fix the edges of walkways and driveways and fix any cracks. Remove anything that might make you trip as you walk through a door, such as a raised step or threshold. Trim any bushes or trees on the path to your home. Use bright outdoor lighting. Clear any walking paths of anything that might make someone trip, such as rocks or tools. Regularly check to see if handrails are loose or broken. Make sure that both sides of any steps have handrails. Any raised decks and porches should have guardrails on the edges. Have any leaves, snow, or ice cleared regularly. Use sand or salt on walking paths during winter. Clean up any spills in your garage right away. This includes oil or grease spills. What can I do in the bathroom? Use night lights. Install grab bars by the toilet and in the tub and shower. Do not use towel bars as grab bars. Use non-skid mats or decals in the tub or shower. If you need to sit down in the shower, use a plastic, non-slip stool. Keep the floor dry. Clean up any water that spills on the floor as soon as it happens. Remove soap buildup in the tub or shower regularly. Attach bath mats securely with double-sided non-slip rug tape. Do not have throw rugs and other things on the floor that can make you trip. What can I do in the bedroom? Use night lights. Make  sure that you have a light by your bed that is easy to reach. Do not use any sheets or blankets that are too big for your bed. They should not hang down onto the floor. Have a firm chair that has side arms. You can use this for support while you get dressed. Do not have throw rugs and other things on the floor that can make you trip. What can I do in the kitchen? Clean up any spills right away. Avoid walking on wet floors. Keep items that you use a lot in easy-to-reach places. If you need to reach something above you, use a strong step stool that has a grab bar. Keep electrical cords out of the way. Do not use floor polish or wax that makes floors slippery. If you must use wax, use non-skid floor wax. Do not have throw rugs and other things on the floor that can make you trip. What can I do with my stairs? Do not leave any items on the stairs. Make sure that there are handrails on both sides of the stairs and use them. Fix handrails that are broken or loose. Make sure that handrails are as long as the stairways. Check any carpeting to make sure that it is firmly attached to the stairs. Fix any carpet that is loose or worn. Avoid having throw rugs at the top or bottom of the stairs. If  you do have throw rugs, attach them to the floor with carpet tape. Make sure that you have a light switch at the top of the stairs and the bottom of the stairs. If you do not have them, ask someone to add them for you. What else can I do to help prevent falls? Wear shoes that: Do not have high heels. Have rubber bottoms. Are comfortable and fit you well. Are closed at the toe. Do not wear sandals. If you use a stepladder: Make sure that it is fully opened. Do not climb a closed stepladder. Make sure that both sides of the stepladder are locked into place. Ask someone to hold it for you, if possible. Clearly mark and make sure that you can see: Any grab bars or handrails. First and last steps. Where the  edge of each step is. Use tools that help you move around (mobility aids) if they are needed. These include: Canes. Walkers. Scooters. Crutches. Turn on the lights when you go into a dark area. Replace any light bulbs as soon as they burn out. Set up your furniture so you have a clear path. Avoid moving your furniture around. If any of your floors are uneven, fix them. If there are any pets around you, be aware of where they are. Review your medicines with your doctor. Some medicines can make you feel dizzy. This can increase your chance of falling. Ask your doctor what other things that you can do to help prevent falls. This information is not intended to replace advice given to you by your health care provider. Make sure you discuss any questions you have with your health care provider. Document Released: 02/20/2009 Document Revised: 10/02/2015 Document Reviewed: 05/31/2014 Elsevier Interactive Patient Education  2017 [employer].

## 2022-03-18 NOTE — Progress Notes (Signed)
Virtual Visit via Telephone Note  I connected with  Brian Hull. on 03/18/22 at  9:15 AM EST by telephone and verified that I am speaking with the correct person using two identifiers.  Location: Patient: home Provider: [place] Persons participating in the virtual visit: [place]   I discussed the limitations, risks, security and privacy concerns of performing an evaluation and management service by telephone and the availability of in person appointments. The patient expressed understanding and agreed to proceed.  Interactive audio and video telecommunications were attempted between this nurse and patient, however failed, due to patient having technical difficulties OR patient did not have access to video capability.  We continued and completed visit with audio only.  Some vital signs may be absent or patient reported.   Dionisio David, LPN  Subjective:   Brian Hull. is a 71 y.o. male who presents for Medicare Annual/Subsequent preventive examination.  Review of Systems     Cardiac Risk Factors include: advanced age (>29mn, >>60women);diabetes mellitus;hypertension;male gender     Objective:    There were no vitals filed for this visit. There is no height or weight on file to calculate BMI.     03/18/2022    9:17 AM 03/17/2021    9:49 AM 03/14/2020    9:48 AM 03/14/2019    2:46 PM 03/10/2018    2:27 PM  Advanced Directives  Does Patient Have a Medical Advance Directive? _0   Does patient want to make changes to medical advance directive?   No - Patient declined    Would patient like information on creating a medical advance directive? No - Patient declined Yes (MAU/Ambulatory/Procedural Areas - Information given)  No - Patient declined Yes (MAU/Ambulatory/Procedural Areas - Information given)    Current Medications (verified) Outpatient Encounter Medications as of 03/18/2022  Medication Sig   Accu-Chek Softclix Lancets  lancets USE TO TEST UP 4 TIMES A DAY (E11.9- NOT USING INSULIN)   amLODipine (NORVASC) 5 MG tablet Take 1 tablet (5 mg total) by mouth daily.   blood glucose meter kit and supplies Dispense based on patient and insurance preference. Use up to four times daily as directed. (FOR ICD-10 E10.9, E11.9).   glucose blood (ACCU-CHEK GUIDE) test strip USE TO TEST UP TO 4 TIMES A DAY E11.9 (NOT USE INSULIN) (INS ONLY COVERS EVERY 90 DAYS)   pantoprazole (PROTONIX) 40 MG tablet TAKE 1 TABLET BY MOUTH EVERY DAY   pravastatin (PRAVACHOL) 10 MG tablet TAKE 1 TABLET BY MOUTH EVERY DAY   No facility-administered encounter medications on file as of 03/18/2022.    Allergies (verified) Azithromycin, [name], [name]   History: Past Medical History:  Diagnosis Date   Amputation, thumb, traumatic 1968   tip amputated    Barrett's esophagus 05/08/02, 02/15/05, 2009   EGD- Barrett's esoph, HH, gastritis, diverticulitis   Basal cell cancer 05/2000   of nose, face (seen at [facility] 2015)   Diabetes mellitus without complication (HCC)    Diet controlled   [place]    Esophageal reflux    History of colon polyps    Hypertension    controlled on meds   Lipoma of unspecified site    PONV (postoperative nausea and vomiting)    as a 71yr old   Pure hypercholesterolemia    Tobacco use disorder    uses snuff   Unspecified gastritis and gastroduodenitis without mention of hemorrhage  Wears dentures    upper   Wrist fracture, left    as a child   Past Surgical History:  Procedure Laterality Date   BROW LIFT Right 01/30/2021   Procedure: BLEPHAROPLASTY UPPER EYELID; W/EXCESS SKIN BLEPHAROPTOSIS REPAIR; RESECT SKIN RIGHT;  Surgeon: Fowler, Amy M, MD;  Location: [facility];  Service: Ophthalmology;  Laterality: Right;   CATARACT EXTRACTION     2012   CHOLECYSTECTOMY, LAPAROSCOPIC  12/20/06   [place]   EYE SURGERY     trauma to right eye as a child, growth removed from eye 2 years  later   Eyelid surgery Bilateral 06/2020   LAMINECTOMY  1984   L 4/5   UPPER GI ENDOSCOPY  9/205   Family History  Problem Relation Age of Onset   Diabetes Mother    Heart disease Mother        MI   Alzheimer's disease Mother        progressing   Cancer Father        possible colon   Diabetes Father    Heart disease Father        CHF   Stroke Father    Hypertension Father    Colon cancer Father        possible dx   Hypertension Sister    Colon polyps Sister    Cancer Sister        breast cancer   Cancer Brother        lung- smoker   Diabetes Brother    Colon polyps Brother    Hypertension Sister    Colon polyps Sister    Depression Neg Hx    Alcohol abuse Neg Hx    Drug abuse Neg Hx    Prostate cancer Neg Hx    Social History   Socioeconomic History   Marital status: Married    Spouse name: Not on file   Number of children: 3   Years of education: Not on file   Highest education level: Not on file  Occupational History   Occupation: retired fire fighter, on disability for back pain  Tobacco Use   Smoking status: Former    Packs/day: 1.00    Years: 55.00    Total pack years: 55.00    Types: Cigarettes    Quit date: 04/10/2017    Years since quitting: 4.9   Smokeless tobacco: Current    Types: Snuff  Vaping Use   Vaping Use: Former  Substance and Sexual Activity   Alcohol use: No    Alcohol/week: 0.0 standard drinks of alcohol   Drug use: No   Sexual activity: Not on file  Other Topics Concern   Not on file  Social History Narrative   3 children initially- 2 sons locally, one daughter died at age 4 from tetrology of fallot.   [employer] 1970-72, domestic   Married 1970   Social Determinants of Health   Financial Resource Strain: Low Risk  (03/18/2022)   Overall Financial Resource Strain (CARDIA)    Difficulty of Paying Living Expenses: Not hard at all  Food Insecurity: No Food Insecurity (03/18/2022)   Hunger Vital Sign    Worried About Running Out  of Food in the Last Year: Never true    Ran Out of Food in the Last Year: Never true  Transportation Needs: No Transportation Needs (03/18/2022)   PRAPARE - Transportation    Lack of Transportation (Medical): No    Lack of Transportation (Non-Medical): No    Physical Activity: Insufficiently Active (03/18/2022)   Exercise Vital Sign    Days of Exercise per Week: 3 days    Minutes of Exercise per Session: 30 min  Stress: No Stress Concern Present (03/18/2022)   [employer] - Occupational Stress Questionnaire    Feeling of Stress : Not at all  Social Connections: Moderately Isolated (03/18/2022)   Social Connection and Isolation Panel [NHANES]    Frequency of Communication with Friends and Family: Never    Frequency of Social Gatherings with Friends and Family: Once a week    Attends Religious Services: More than 4 times per year    Active Member of Clubs or Organizations: No    Attends Club or Organization Meetings: Never    Marital Status: Married    Tobacco Counseling Ready to quit: Not Answered Counseling given: Not Answered   Clinical Intake:  Pre-visit preparation completed: Yes  Pain : No/denies pain     Nutritional Risks: None Diabetes: Yes CBG done?: No Did pt. bring in CBG monitor from home?: No  How often do you need to have someone help you when you read instructions, pamphlets, or other written materials from your doctor or pharmacy?: 1 - Never  Diabetic?yes Nutrition Risk Assessment:  Has the patient had any N/V/D within the last 2 months?  Yes  Does the patient have any non-healing wounds?  No  Has the patient had any unintentional weight loss or weight gain?  No   Diabetes:  Is the patient diabetic?  Yes  If diabetic, was a CBG obtained today?  No  Did the patient bring in their glucometer from home?  No  How often do you monitor your CBG's? Three times/week.   Financial Strains and Diabetes Management:  Are you having  any financial strains with the device, your supplies or your medication? No .  Does the patient want to be seen by Chronic Care Management for management of their diabetes?  No  Would the patient like to be referred to a Nutritionist or for Diabetic Management?  No   Diabetic Exams:  Diabetic Eye Exam: Completed 02/23/21- had exam this year already 02/25/22.  Pt has been advised about the importance in completing this exam.   Diabetic Foot Exam: Completed 03/23/21. Pt has been advised about the importance in completing this exam.   Interpreter Needed?: No  Information entered by :: Lorrie Barnes, LPN   Activities of Daily Living    03/18/2022    9:18 AM  In your present state of health, do you have any difficulty performing the following activities:  Hearing? 0  Vision? 0  Difficulty concentrating or making decisions? 0  Walking or climbing stairs? 0  Dressing or bathing? 0  Doing errands, shopping? 0  Preparing Food and eating ? N  Using the Toilet? N  In the past six months, have you accidently leaked urine? N  Do you have problems with loss of bowel control? N  Managing your Medications? N  Managing your Finances? N  Housekeeping or managing your Housekeeping? N    Patient Care Team: Duncan, Graham S, MD as PCP - General Gollan, Timothy J, MD as PCP - Cardiology (Cardiology)  Indicate any recent Medical Services you may have received from other than [facility] providers in the past year (date may be approximate).     Assessment:   This is a routine wellness examination for Jermani.  Hearing/Vision screen Hearing Screening - Comments:: No aids Vision Screening -   Comments:: No glasses  Dietary issues and exercise activities discussed: Current Exercise Habits: Home exercise routine, Time (Minutes): 30, Frequency (Times/Week): 3, Weekly Exercise (Minutes/Week): 90, Intensity: Mild   Goals Addressed             This Visit's Progress    DIET - EAT MORE FRUITS AND  VEGETABLES         Depression Screen    03/18/2022    9:15 AM 03/17/2021    9:53 AM 03/14/2020    9:50 AM 03/14/2019    2:48 PM 03/10/2018    2:26 PM 03/08/2017    8:59 AM 03/04/2016   10:02 AM  PHQ 2/9 Scores  PHQ - 2 Score 0 0 0 0 0 0 0  PHQ- 9 Score 0  0 0 0      Fall Risk    03/18/2022    9:17 AM 03/17/2021    9:51 AM 03/14/2020    9:50 AM 04/03/2019    1:54 PM 03/14/2019    2:48 PM  [place] in the past year? 0 0 0 0 0  Comment    Emmi Telephone Survey: data to providers prior to load   Number falls in past yr: 0 0 0  0  Injury with Fall? 0 0 0  0  Risk for fall due to : No Fall Risks No Fall Risks No Fall Risks    Follow up Falls prevention discussed;Falls evaluation completed  Falls evaluation completed;Falls prevention discussed  Falls evaluation completed;Falls prevention discussed    FALL RISK PREVENTION PERTAINING TO THE HOME:  Any stairs in or around the home? Yes  If so, are there any without handrails? No  Home free of loose throw rugs in walkways, pet beds, electrical cords, etc? Yes  Adequate lighting in your home to reduce risk of falls? Yes   ASSISTIVE DEVICES UTILIZED TO PREVENT FALLS:  Life alert? No  Use of a cane, walker or w/c? No  Grab bars in the bathroom? No  Shower chair or bench in shower? No  Elevated toilet seat or a handicapped toilet? No     Cognitive Function:    03/14/2020    9:53 AM 03/14/2019    2:52 PM 03/10/2018    2:27 PM  MMSE - Mini Mental State Exam  Orientation to time _0 Orientation to Place _1 Registration _2 Attention/ Calculation 5 5 0  Recall _3 Language- name 2 objects   0  Language- repeat _4 Language- follow 3 step command   3  Language- read & follow direction   0  Write a sentence   0  Copy design   0  Total score   20        03/18/2022    9:21 AM  6CIT Screen  What Year? 0 points  What month? 0 points  What time? 0 points  Count back from 20 0 points  Months in  reverse 0 points  Repeat phrase 0 points  Total Score 0 points    Immunizations Immunization History  Administered Date(s) Administered   Fluad Quad(high Dose 65+) 01/09/2019, 03/17/2020, 03/23/2021   Influenza Split 02/14/2012, 02/15/2014   Influenza,inj,Quad PF,6+ Mos 02/27/2013, 03/03/2015, 03/04/2016, 03/08/2017, 02/09/2018   PFIZER(Purple Top)SARS-COV-2 Vaccination 07/24/2019, 08/14/2019   Pneumococcal Conjugate-13 03/03/2015   Pneumococcal Polysaccharide-23 03/04/2016   Td 05/11/1996, 01/25/2007   Td (Adult),5 Lf Tetanus Toxid, Preservative  Free 01/26/2022   Zoster Recombinat (Shingrix) 10/13/2021, 12/13/2021   Zoster, Live 05/21/2011    TDAP status: Up to date  Flu Vaccine status: Due, Education has been provided regarding the importance of this vaccine. Advised may receive this vaccine at local pharmacy or Health Dept. Aware to provide a copy of the vaccination record if obtained from local pharmacy or Health Dept. Verbalized acceptance and understanding.  Pneumococcal vaccine status: Up to date  Covid-19 vaccine status: Completed vaccines  Qualifies for Shingles Vaccine? Yes   Zostavax completed Yes   Shingrix Completed?: Yes  Screening Tests Health Maintenance  Topic Date Due   COVID-19 Vaccine (3 - Pfizer risk series) 09/11/2019   INFLUENZA VACCINE  12/08/2021   COLONOSCOPY (Pts 45-27yr Insurance coverage will need to be confirmed)  03/01/2022   OPHTHALMOLOGY EXAM  02/23/2022   Diabetic kidney evaluation - GFR measurement  03/23/2022   Diabetic kidney evaluation - Urine ACR  03/23/2022   TETANUS/TDAP  03/14/2024 (Originally 01/24/2017)   FOOT EXAM  03/23/2022   HEMOGLOBIN A1C  03/25/2022   Lung Cancer Screening  11/28/2022   Medicare Annual Wellness (AWV)  03/19/2023   Pneumonia Vaccine 71 Years old  Completed   Hepatitis C Screening  Completed   Zoster Vaccines- Shingrix  Completed   HPV VACCINES  Aged Out    Health Maintenance  Health Maintenance  Due  Topic Date Due   COVID-19 Vaccine (3 - Pfizer risk series) 09/11/2019   INFLUENZA VACCINE  12/08/2021   COLONOSCOPY (Pts 45-416yrInsurance coverage will need to be confirmed)  03/01/2022   OPHTHALMOLOGY EXAM  02/23/2022   Diabetic kidney evaluation - GFR measurement  03/23/2022   Diabetic kidney evaluation - Urine ACR  03/23/2022    Colorectal cancer screening: Type of screening: Colonoscopy. Completed 03/01/17. Repeat every 5 years - has been scheduled for January 2024  Lung Cancer Screening: (Low Dose CT Chest recommended if Age 73-80ears, 30 pack-year currently smoking OR have quit w/in 15years.) does not qualify.   Additional Screening: ordered 11/27/21 lung ca screen  Hepatitis C Screening: does qualify; Completed 09/01/15  Vision Screening: Recommended annual ophthalmology exams for early detection of glaucoma and other disorders of the eye. Is the patient up to date with their annual eye exam?  Yes  Who is the provider or what is the name of the office in which the patient attends annual eye exams? [facility] If pt is not established with a provider, would they like to be referred to a provider to establish care? No .   Dental Screening: Recommended annual dental exams for proper oral hygiene  Community Resource Referral / Chronic Care Management: CRR required this visit?  No   CCM required this visit?  No      Plan:     I have personally reviewed and noted the following in the patient's chart:   Medical and social history Use of alcohol, tobacco or illicit drugs  Current medications and supplements including opioid prescriptions. Patient is not currently taking opioid prescriptions. Functional ability and status Nutritional status Physical activity Advanced directives List of other physicians Hospitalizations, surgeries, and ER visits in previous 12 months Vitals Screenings to include cognitive, depression, and falls Referrals and appointments  In  addition, I have reviewed and discussed with patient certain preventive protocols, quality metrics, and best practice recommendations. A written personalized care plan for preventive services as well as general preventive health recommendations were provided to patient.     Chamberlain Steinborn  S Barnes, LPN   03/18/2022   Nurse Notes: none    

## 2022-03-22 ENCOUNTER — Other Ambulatory Visit (INDEPENDENT_AMBULATORY_CARE_PROVIDER_SITE_OTHER): Payer: Medicare Other

## 2022-03-22 DIAGNOSIS — Z125 Encounter for screening for malignant neoplasm of prostate: Secondary | ICD-10-CM | POA: Diagnosis not present

## 2022-03-22 DIAGNOSIS — E119 Type 2 diabetes mellitus without complications: Secondary | ICD-10-CM

## 2022-03-22 LAB — CBC WITH DIFFERENTIAL/PLATELET
Basophils Absolute: 0.1 10*3/uL (ref 0.0–0.1)
Basophils Relative: 1.3 % (ref 0.0–3.0)
Eosinophils Absolute: 0.2 10*3/uL (ref 0.0–0.7)
Eosinophils Relative: 3 % (ref 0.0–5.0)
HCT: 42.7 % (ref 39.0–52.0)
Hemoglobin: 14.2 g/dL (ref 13.0–17.0)
Lymphocytes Relative: 27.5 % (ref 12.0–46.0)
Lymphs Abs: 2.2 10*3/uL (ref 0.7–4.0)
MCHC: 33.2 g/dL (ref 30.0–36.0)
MCV: 91.9 fl (ref 78.0–100.0)
Monocytes Absolute: 0.6 10*3/uL (ref 0.1–1.0)
Monocytes Relative: 7.7 % (ref 3.0–12.0)
Neutro Abs: 4.9 10*3/uL (ref 1.4–7.7)
Neutrophils Relative %: 60.5 % (ref 43.0–77.0)
Platelets: 173 10*3/uL (ref 150.0–400.0)
RBC: 4.65 Mil/uL (ref 4.22–5.81)
RDW: 13.2 % (ref 11.5–15.5)
WBC: 8 10*3/uL (ref 4.0–10.5)

## 2022-03-22 LAB — COMPREHENSIVE METABOLIC PANEL
ALT: 20 U/L (ref 0–53)
AST: 16 U/L (ref 0–37)
Albumin: 4.2 g/dL (ref 3.5–5.2)
Alkaline Phosphatase: 35 U/L — ABNORMAL LOW (ref 39–117)
BUN: 18 mg/dL (ref 6–23)
CO2: 27 mEq/L (ref 19–32)
Calcium: 8.9 mg/dL (ref 8.4–10.5)
Chloride: 107 mEq/L (ref 96–112)
Creatinine, Ser: 1.18 mg/dL (ref 0.40–1.50)
GFR: 61.49 mL/min (ref >60.00)
Glucose, Bld: 144 mg/dL — ABNORMAL HIGH (ref 70–99)
Potassium: 4.3 mEq/L (ref 3.5–5.1)
Sodium: 141 mEq/L (ref 135–145)
Total Bilirubin: 0.6 mg/dL (ref 0.2–1.2)
Total Protein: 6.8 g/dL (ref 6.0–8.3)

## 2022-03-22 LAB — LIPID PANEL
Cholesterol: 125 mg/dL (ref 0–200)
HDL: 46 mg/dL (ref >39.00)
LDL Cholesterol: 64 mg/dL (ref 0–99)
NonHDL: 78.87
Total CHOL/HDL Ratio: 3
Triglycerides: 72 mg/dL (ref 0.0–149.0)
VLDL: 14.4 mg/dL (ref 0.0–40.0)

## 2022-03-22 LAB — HEMOGLOBIN A1C: Hgb A1c MFr Bld: 7 % — ABNORMAL HIGH (ref 4.6–6.5)

## 2022-03-22 LAB — PSA, MEDICARE: PSA: 1.9 ng/ml (ref 0.10–4.00)

## 2022-03-23 ENCOUNTER — Ambulatory Visit: Payer: Medicare Other | Admitting: Family Medicine

## 2022-03-23 LAB — MICROALBUMIN / CREATININE URINE RATIO
Creatinine,U: 179.3 mg/dL
Microalb Creat Ratio: 0.4 mg/g (ref 0.0–30.0)
Microalb, Ur: <0.7 mg/dL (ref 0.0–1.9)

## 2022-03-25 ENCOUNTER — Ambulatory Visit (INDEPENDENT_AMBULATORY_CARE_PROVIDER_SITE_OTHER): Payer: Medicare Other | Admitting: Family Medicine

## 2022-03-25 ENCOUNTER — Encounter: Payer: Self-pay | Admitting: Family Medicine

## 2022-03-25 VITALS — BP 128/68 | HR 69 | Temp 97.2°F | Ht 71.0 in | Wt 207.0 lb

## 2022-03-25 DIAGNOSIS — I10 Essential (primary) hypertension: Secondary | ICD-10-CM | POA: Diagnosis not present

## 2022-03-25 DIAGNOSIS — E119 Type 2 diabetes mellitus without complications: Secondary | ICD-10-CM

## 2022-03-25 DIAGNOSIS — Z7189 Other specified counseling: Secondary | ICD-10-CM

## 2022-03-25 DIAGNOSIS — K22719 Barrett's esophagus with dysplasia, unspecified: Secondary | ICD-10-CM

## 2022-03-25 DIAGNOSIS — Z23 Encounter for immunization: Secondary | ICD-10-CM

## 2022-03-25 DIAGNOSIS — Z0000 Encounter for general adult medical examination without abnormal findings: Secondary | ICD-10-CM

## 2022-03-25 DIAGNOSIS — I2510 Atherosclerotic heart disease of native coronary artery without angina pectoris: Secondary | ICD-10-CM | POA: Diagnosis not present

## 2022-03-25 MED ORDER — PRAVASTATIN SODIUM 10 MG PO TABS: 10.0000 mg | ORAL_TABLET | Freq: Every day | ORAL | 3 refills | Status: DC

## 2022-03-25 MED ORDER — PANTOPRAZOLE SODIUM 40 MG PO TBEC: 40.0000 mg | DELAYED_RELEASE_TABLET | Freq: Every day | ORAL | 3 refills | Status: DC

## 2022-03-25 NOTE — Patient Instructions (Addendum)
If your get lightheaded at all, then cut amlodipine back to 2.'5mg'$  a day (1/2 tab of '5mg'$ ). Take care.  Glad to see you. Recheck A1c in about 6 months at a visit.

## 2022-03-25 NOTE — Progress Notes (Signed)
Diabetes:  No meds.   Hypoglycemic episodes: no Hyperglycemic episodes: no Feet problems: no Blood Sugars averaging: ~130 eye exam within last year: yes  Barrett's.  Still on PPI.  Swallowing well.  Rationale for PPI use discussed with patient.  He has CT scanning of lungs ongoing, yearly.   Shingles prev done Tetanus 2023.   PNA up to date Flu 2023 covid vaccine 2021.   PSA wnl 2023.   Advance directive- wife designated if patient were incapacitated. Colonoscopy 2018.    Hypertension:    Using medication without problems or lightheadedness:  yes Chest pain with exertion:no Edema:no Short of breath:no  He is dealing with R knee and hip pain after working in the yard.  He is getting better but it was a hassle in the meantime.    He is going to see eye clinic about changes on the R lower eyelid, recurrent styes.  He is using warm compresses.  D/w pt.    PMH and SH reviewed  Meds, vitals, and allergies reviewed.   ROS: Per HPI unless specifically indicated in ROS section   GEN: nad, alert and oriented HEENT: mucous membranes moist, stye right lower eyelid. NECK: supple w/o LA CV: rrr. PULM: ctab, no inc wob ABD: soft, +bs EXT: no edema SKIN: no acute rash R knee crepitus on ROM  Diabetic foot exam: Normal inspection No skin breakdown No calluses  Normal DP pulses Normal sensation to light touch and monofilament Nails normal

## 2022-03-28 NOTE — Assessment & Plan Note (Signed)
Advance directive- wife designated if patient were incapacitated.  

## 2022-03-28 NOTE — Assessment & Plan Note (Signed)
Continue amlodipine.  Continue work on diet and exercise.  Continue pravastatin.

## 2022-03-28 NOTE — Assessment & Plan Note (Signed)
He has CT scanning of lungs ongoing, yearly.   Shingles prev done Tetanus 2023.   PNA up to date Flu 2023 covid vaccine 2021.   PSA wnl 2023.   Advance directive- wife designated if patient were incapacitated. Colonoscopy 2018.

## 2022-03-28 NOTE — Assessment & Plan Note (Signed)
Labs discussed with patient.  No new medications this point.  Continue work on diet and exercise.

## 2022-03-28 NOTE — Assessment & Plan Note (Signed)
History of, continue PPI.

## 2022-04-15 ENCOUNTER — Other Ambulatory Visit: Payer: Self-pay | Admitting: Family Medicine

## 2022-06-07 DIAGNOSIS — Z85828 Personal history of other malignant neoplasm of skin: Secondary | ICD-10-CM | POA: Diagnosis not present

## 2022-06-07 DIAGNOSIS — D2262 Melanocytic nevi of left upper limb, including shoulder: Secondary | ICD-10-CM | POA: Diagnosis not present

## 2022-06-07 DIAGNOSIS — L570 Actinic keratosis: Secondary | ICD-10-CM | POA: Diagnosis not present

## 2022-06-07 DIAGNOSIS — C44629 Squamous cell carcinoma of skin of left upper limb, including shoulder: Secondary | ICD-10-CM | POA: Diagnosis not present

## 2022-06-07 DIAGNOSIS — D225 Melanocytic nevi of trunk: Secondary | ICD-10-CM | POA: Diagnosis not present

## 2022-06-07 DIAGNOSIS — X32XXXA Exposure to sunlight, initial encounter: Secondary | ICD-10-CM | POA: Diagnosis not present

## 2022-06-07 DIAGNOSIS — D2261 Melanocytic nevi of right upper limb, including shoulder: Secondary | ICD-10-CM | POA: Diagnosis not present

## 2022-06-07 DIAGNOSIS — D485 Neoplasm of uncertain behavior of skin: Secondary | ICD-10-CM | POA: Diagnosis not present

## 2022-06-09 ENCOUNTER — Ambulatory Visit: Payer: Medicare Other | Admitting: Anesthesiology

## 2022-06-09 ENCOUNTER — Ambulatory Visit
Admission: RE | Admit: 2022-06-09 | Discharge: 2022-06-09 | Disposition: A | Discharge status: Home / Self Care | Payer: Medicare Other | Attending: Internal Medicine | Admitting: Internal Medicine

## 2022-06-09 ENCOUNTER — Encounter
Admission: RE | Disposition: A | Discharge status: Home / Self Care | Payer: Self-pay | Source: Home / Self Care | Attending: Internal Medicine

## 2022-06-09 ENCOUNTER — Encounter: Payer: Self-pay | Admitting: Internal Medicine

## 2022-06-09 DIAGNOSIS — J449 Chronic obstructive pulmonary disease, unspecified: Secondary | ICD-10-CM | POA: Insufficient documentation

## 2022-06-09 DIAGNOSIS — K219 Gastro-esophageal reflux disease without esophagitis: Secondary | ICD-10-CM | POA: Insufficient documentation

## 2022-06-09 DIAGNOSIS — K635 Polyp of colon: Secondary | ICD-10-CM | POA: Insufficient documentation

## 2022-06-09 DIAGNOSIS — K648 Other hemorrhoids: Secondary | ICD-10-CM | POA: Diagnosis not present

## 2022-06-09 DIAGNOSIS — Z87891 Personal history of nicotine dependence: Secondary | ICD-10-CM | POA: Diagnosis not present

## 2022-06-09 DIAGNOSIS — Z1211 Encounter for screening for malignant neoplasm of colon: Secondary | ICD-10-CM | POA: Diagnosis not present

## 2022-06-09 DIAGNOSIS — K621 Rectal polyp: Secondary | ICD-10-CM | POA: Insufficient documentation

## 2022-06-09 DIAGNOSIS — K640 First degree hemorrhoids: Secondary | ICD-10-CM | POA: Diagnosis not present

## 2022-06-09 DIAGNOSIS — Z800 Family history of malignant neoplasm of digestive organs: Secondary | ICD-10-CM | POA: Insufficient documentation

## 2022-06-09 DIAGNOSIS — I10 Essential (primary) hypertension: Secondary | ICD-10-CM | POA: Diagnosis not present

## 2022-06-09 DIAGNOSIS — D126 Benign neoplasm of colon, unspecified: Secondary | ICD-10-CM | POA: Diagnosis not present

## 2022-06-09 DIAGNOSIS — E119 Type 2 diabetes mellitus without complications: Secondary | ICD-10-CM | POA: Diagnosis not present

## 2022-06-09 DIAGNOSIS — Z86010 Personal history of colonic polyps: Secondary | ICD-10-CM | POA: Insufficient documentation

## 2022-06-09 DIAGNOSIS — D128 Benign neoplasm of rectum: Secondary | ICD-10-CM | POA: Diagnosis not present

## 2022-06-09 HISTORY — PX: COLONOSCOPY: SHX5424

## 2022-06-09 SURGERY — COLONOSCOPY
Anesthesia: General

## 2022-06-09 MED ORDER — SODIUM CHLORIDE 0.9 % IV SOLN: INTRAVENOUS | Status: DC

## 2022-06-09 MED ORDER — LIDOCAINE HCL (CARDIAC) PF 100 MG/5ML IV SOSY
PREFILLED_SYRINGE | INTRAVENOUS | Status: DC | PRN
  Administered 2022-06-09: 50 mg via INTRAVENOUS

## 2022-06-09 MED ORDER — PROPOFOL 500 MG/50ML IV EMUL
INTRAVENOUS | Status: DC | PRN
  Administered 2022-06-09: 100 mg via INTRAVENOUS
  Administered 2022-06-09: 200 ug/kg/min via INTRAVENOUS

## 2022-06-09 NOTE — Interval H&P Note (Signed)
History and Physical Interval Note:  06/09/2022 2:02 PM  Brian Hull.  has presented today for surgery, with the diagnosis of Hx of adenomatous polyp of colon (Z86.010) Slow transit constipation (K59.01).  The various methods of treatment have been discussed with the patient and family. After consideration of risks, benefits and other options for treatment, the patient has consented to  Procedure(s): COLONOSCOPY (N/A) as a surgical intervention.  The patient's history has been reviewed, patient examined, no change in status, stable for surgery.  I have reviewed the patient's chart and labs.  Questions were answered to the patient's satisfaction.     [place], Brian Hull

## 2022-06-09 NOTE — H&P (Signed)
Outpatient short stay form Pre-procedure 06/09/2022 2:00 PM Brian Hull K. [name], M.D.  Primary Physician: Elsie Stain, M.D.  Reason for visit:  Hx of adenomatous colon polyps, 2018.  History of present illness:                            Patient presents for colonoscopy for a personal hx of colon polyps. The patient denies abdominal pain, abnormal weight loss or rectal bleeding.  03/01/2017: Colonoscopy: Indication: Personal history of colon polyps, family history of colon cancer in first-degree relative: Impression 1 diminutive polyp, internal hemorrhoids. Pathology: Tubular adenoma. Repeat colonoscopy 02/2022.    Current Facility-Administered Medications:    0.9 %  sodium chloride infusion, , Intravenous, Continuous, Fisher, Benay Pike, MD, Last Rate: 20 mL/hr at 06/09/22 1312, New Bag at 06/09/22 1312  Medications Prior to Admission  Medication Sig Dispense Refill Last Dose   amLODipine (NORVASC) 5 MG tablet Take 1 tablet (5 mg total) by mouth daily. 90 tablet 3 06/08/2022   pantoprazole (PROTONIX) 40 MG tablet Take 1 tablet (40 mg total) by mouth daily. 90 tablet 3 06/08/2022   pravastatin (PRAVACHOL) 10 MG tablet TAKE 1 TABLET BY MOUTH EVERY DAY 90 tablet 3 06/08/2022   Accu-Chek Softclix Lancets lancets USE TO TEST UP 4 TIMES A DAY (E11.9- NOT USING INSULIN) 100 each 11    blood glucose meter kit and supplies Dispense based on patient and insurance preference. Use up to four times daily as directed. (FOR ICD-10 E10.9, E11.9). 1 each 0    glucose blood (ACCU-CHEK GUIDE) test strip USE TO TEST UP TO 4 TIMES A DAY E11.9 (NOT USE INSULIN) (INS ONLY COVERS EVERY 90 DAYS) 100 strip 11      Allergies  Allergen Reactions   Azithromycin Rash   [name] Other (See Comments)    Edema at higher doses.  Not a true allergy o/w.     Tolmetin     Other reaction(s): Other (See Comments) Edema at higher doses.  Not a true allergy o/w.       Past Medical History:  Diagnosis Date   Amputation,  thumb, traumatic 1968   tip amputated    Barrett's esophagus 05/08/02, 02/15/05, 2009   EGD- Barrett's esoph, HH, gastritis, diverticulitis   Basal cell cancer 05/2000   of nose, face (seen at [facility] 2015)   Diabetes mellitus without complication (HCC)    Diet controlled   Emphysema lung ([place])    Esophageal reflux    History of colon polyps    Hypertension    controlled on meds   Lipoma of unspecified site    PONV (postoperative nausea and vomiting)    as a 72 yr old   Pure hypercholesterolemia    Tobacco use disorder    uses snuff   Unspecified gastritis and gastroduodenitis without mention of hemorrhage    Wears dentures    upper   Wrist fracture, left    as a child    Review of systems:  Otherwise negative.    Physical Exam  Gen: Alert, oriented. Appears stated age.  HEENT: [place]/AT. PERRLA. Lungs: CTA, no wheezes. CV: RR nl S1, S2. Abd: soft, benign, no masses. BS+ Ext: No edema. Pulses 2+    Planned procedures: Proceed with colonoscopy. The patient understands the nature of the planned procedure, indications, risks, alternatives and potential complications including but not limited to bleeding, infection, perforation, damage to internal organs and possible oversedation/side effects from  anesthesia. The patient agrees and gives consent to proceed.  Please refer to procedure notes for findings, recommendations and patient disposition/instructions.     Brian Hull K. [name], M.D. Gastroenterology 06/09/2022  2:00 PM

## 2022-06-09 NOTE — Transfer of Care (Signed)
Immediate Anesthesia Transfer of Care Note  Patient: Brian Hull.  Procedure(s) Performed: COLONOSCOPY  Patient Location: PACU  Anesthesia Type:General  Level of Consciousness: drowsy  Airway & Oxygen Therapy: Patient Spontanous Breathing  Post-op Assessment: Report given to RN and Post -op Vital signs reviewed and stable  Post vital signs: Reviewed and stable  Last Vitals:  Vitals Value Taken Time  BP    Temp    Pulse    Resp    SpO2      Last Pain:  Vitals:   06/09/22 1255  TempSrc: Temporal  PainSc: 0-No pain         Complications: No notable events documented.

## 2022-06-09 NOTE — Op Note (Signed)
[facility] Patient Name: Henrik Orihuela Procedure Date: 06/09/2022 2:02 PM MRN: [id number] Account #: [id number] Date of Birth: 1950/07/27 Admit Type: Outpatient Age: 72 Room: [facility] ENDO ROOM 2 Gender: Male Note Status: Finalized Instrument Name: Park Meo [id number] Procedure:             Colonoscopy Indications:           High risk colon cancer surveillance: Personal history                         of non-advanced adenoma Providers:             Benay Pike. [name] MD, MD Referring MD:          Benay Pike. [name] MD, MD (Referring MD), Elveria Rising.                         Damita Dunnings, MD (Referring MD) Medicines:             Propofol per Anesthesia Complications:         No immediate complications. Procedure:             Pre-Anesthesia Assessment:                        - The risks and benefits of the procedure and the                         sedation options and risks were discussed with the                         patient. All questions were answered and informed                         consent was obtained.                        - Patient identification and proposed procedure were                         verified prior to the procedure by the nurse. The                         procedure was verified in the procedure room.                        - ASA Grade Assessment: III - A patient with severe                         systemic disease.                        - After reviewing the risks and benefits, the patient                         was deemed in satisfactory condition to undergo the                         procedure.                        After obtaining  informed consent, the colonoscope was                         passed under direct vision. Throughout the procedure,                         the patient's blood pressure, pulse, and oxygen                         saturations were monitored continuously. The                         Colonoscope was  introduced through the anus and                         advanced to the the cecum, identified by appendiceal                         orifice and ileocecal valve. The colonoscopy was                         performed without difficulty. The patient tolerated                         the procedure well. The quality of the bowel                         preparation was good. The ileocecal valve, appendiceal                         orifice, and rectum were photographed. Findings:      The perianal and digital rectal examinations were normal. Pertinent       negatives include normal sphincter tone and no palpable rectal lesions.      Non-bleeding internal hemorrhoids were found during retroflexion. The       hemorrhoids were Grade I (internal hemorrhoids that do not prolapse).      Three sessile polyps were found in the rectum. The polyps were       diminutive in size. These polyps were removed with a cold biopsy       forceps. Resection and retrieval were complete.      A 5 mm polyp was found in the transverse colon. The polyp was sessile.       The polyp was removed with a piecemeal technique using a cold biopsy       forceps. Resection and retrieval were complete.      The exam was otherwise without abnormality. Impression:            - Non-bleeding internal hemorrhoids.                        - Three diminutive polyps in the rectum, removed with                         a cold biopsy forceps. Resected and retrieved.                        - One 5 mm polyp in the transverse colon, removed  piecemeal using a cold biopsy forceps. Resected and                         retrieved.                        - The examination was otherwise normal. Recommendation:        - Patient has a contact number available for                         emergencies. The signs and symptoms of potential                         delayed complications were discussed with the patient.                          Return to normal activities tomorrow. Written                         discharge instructions were provided to the patient.                        - Resume previous diet.                        - Continue present medications.                        - Await pathology results.                        - If polyps are benign or adenomatous without                         dysplasia, I will advise NO further colonoscopy due to                         advanced age and/or severe comorbidity.                        - Return to GI office PRN.                        - The findings and recommendations were discussed with                         the patient. Procedure Code(s):     --- Professional ---                        [phone], Colonoscopy, flexible; with biopsy, single or                         multiple Diagnosis Code(s):     --- Professional ---                        K64.0, First degree hemorrhoids                        D12.3, Benign neoplasm of transverse colon (hepatic  flexure or splenic flexure)                        D12.8, Benign neoplasm of rectum                        Z86.010, Personal history of colonic polyps CPT copyright 2022 [employer]. All rights reserved. The codes documented in this report are preliminary and upon coder review may  be revised to meet current compliance requirements. Efrain Sella MD, MD 06/09/2022 2:23:42 PM This report has been signed electronically. Number of Addenda: 0 Note Initiated On: 06/09/2022 2:02 PM Scope Withdrawal Time: 0 hours 5 minutes 45 seconds  Total Procedure Duration: 0 hours 8 minutes 50 seconds  Estimated Blood Loss:  Estimated blood loss: none.      [facility]

## 2022-06-09 NOTE — Anesthesia Preprocedure Evaluation (Addendum)
Anesthesia Evaluation  Patient identified by MRN, date of birth, ID band Patient awake    Reviewed: Allergy & Precautions, NPO status , Patient's Chart, lab work & pertinent test results  History of Anesthesia Complications (+) PONV and history of anesthetic complications  Airway Mallampati: II   Neck ROM: Full    Dental  (+) Upper Dentures   Pulmonary COPD, former smoker   Pulmonary exam normal breath sounds clear to auscultation       Cardiovascular hypertension, (-) CAD Normal cardiovascular exam Rhythm:Regular Rate:Normal     Neuro/Psych negative neurological ROS     GI/Hepatic ,GERD (Barrett esophagus)  Controlled,,  Endo/Other  diabetes, Well Controlled, Type 2    Renal/GU negative Renal ROS     Musculoskeletal   Abdominal Normal abdominal exam  (+)   Peds  Hematology negative hematology ROS (+)   Anesthesia Other Findings Current snuff use  Reproductive/Obstetrics                             Anesthesia Physical Anesthesia Plan  ASA: 3  Anesthesia Plan: General   Post-op Pain Management:    Induction: Intravenous  PONV Risk Score and Plan: 2 and TIVA, Treatment may vary due to age or medical condition and Propofol infusion  Airway Management Planned: Nasal Cannula  Additional Equipment:   Intra-op Plan:   Post-operative Plan:   Informed Consent: I have reviewed the patients History and Physical, chart, labs and discussed the procedure including the risks, benefits and alternatives for the proposed anesthesia with the patient or authorized representative who has indicated his/her understanding and acceptance.       Plan Discussed with: CRNA  Anesthesia Plan Comments: (Avoid midazolam -- pt reports postoperative visual hallucinations postoperatively.)        Anesthesia Quick Evaluation

## 2022-06-10 ENCOUNTER — Encounter: Payer: Self-pay | Admitting: Internal Medicine

## 2022-06-10 NOTE — Anesthesia Postprocedure Evaluation (Signed)
Anesthesia Post Note  Patient: Brian Hull.  Procedure(s) Performed: COLONOSCOPY  Patient location during evaluation: PACU Anesthesia Type: General Level of consciousness: awake and alert Pain management: pain level controlled Vital Signs Assessment: post-procedure vital signs reviewed and stable Respiratory status: spontaneous breathing, nonlabored ventilation and respiratory function stable Cardiovascular status: blood pressure returned to baseline and stable Postop Assessment: no apparent nausea or vomiting Anesthetic complications: no   There were no known notable events for this encounter.   Last Vitals:  Vitals:   06/09/22 1424 06/09/22 1454  BP: (!) 114/59 (!) 150/69  Pulse: 64   Resp: 19   Temp: (!) 36.3 C   SpO2: 98%     Last Pain:  Vitals:   06/10/22 0732  TempSrc:   PainSc: 0-No pain                 [place] Ouch

## 2022-06-11 LAB — SURGICAL PATHOLOGY

## 2022-07-13 DIAGNOSIS — L308 Other specified dermatitis: Secondary | ICD-10-CM | POA: Diagnosis not present

## 2022-07-13 DIAGNOSIS — D485 Neoplasm of uncertain behavior of skin: Secondary | ICD-10-CM | POA: Diagnosis not present

## 2022-07-13 DIAGNOSIS — H02403 Unspecified ptosis of bilateral eyelids: Secondary | ICD-10-CM | POA: Diagnosis not present

## 2022-07-13 DIAGNOSIS — D4989 Neoplasm of unspecified behavior of other specified sites: Secondary | ICD-10-CM | POA: Diagnosis not present

## 2022-07-13 DIAGNOSIS — L989 Disorder of the skin and subcutaneous tissue, unspecified: Secondary | ICD-10-CM | POA: Diagnosis not present

## 2022-07-15 DIAGNOSIS — D0462 Carcinoma in situ of skin of left upper limb, including shoulder: Secondary | ICD-10-CM | POA: Diagnosis not present

## 2022-07-15 DIAGNOSIS — C44629 Squamous cell carcinoma of skin of left upper limb, including shoulder: Secondary | ICD-10-CM | POA: Diagnosis not present

## 2022-09-16 DIAGNOSIS — B358 Other dermatophytoses: Secondary | ICD-10-CM | POA: Diagnosis not present

## 2022-09-16 DIAGNOSIS — H01006 Unspecified blepharitis left eye, unspecified eyelid: Secondary | ICD-10-CM | POA: Diagnosis not present

## 2022-09-16 DIAGNOSIS — H01003 Unspecified blepharitis right eye, unspecified eyelid: Secondary | ICD-10-CM | POA: Diagnosis not present

## 2022-09-23 ENCOUNTER — Ambulatory Visit (INDEPENDENT_AMBULATORY_CARE_PROVIDER_SITE_OTHER): Payer: Medicare Other | Admitting: Family Medicine

## 2022-09-23 VITALS — BP 120/82 | HR 84 | Temp 97.8°F | Ht 71.0 in | Wt 207.0 lb

## 2022-09-23 DIAGNOSIS — E119 Type 2 diabetes mellitus without complications: Secondary | ICD-10-CM | POA: Diagnosis not present

## 2022-09-23 DIAGNOSIS — H00019 Hordeolum externum unspecified eye, unspecified eyelid: Secondary | ICD-10-CM | POA: Diagnosis not present

## 2022-09-23 DIAGNOSIS — M25561 Pain in right knee: Secondary | ICD-10-CM

## 2022-09-23 LAB — POCT GLYCOSYLATED HEMOGLOBIN (HGB A1C): Hemoglobin A1C: 6.9 % — AB (ref 4.0–5.6)

## 2022-09-23 MED ORDER — ACCU-CHEK GUIDE VI STRP: ORAL_STRIP | 11 refills | Status: DC

## 2022-09-23 MED ORDER — ACCU-CHEK SOFTCLIX LANCETS MISC: 11 refills | Status: DC

## 2022-09-23 MED ORDER — DICLOFENAC SODIUM 1 % EX GEL: 2.0000 g | Freq: Four times a day (QID) | CUTANEOUS | Status: AC | PRN

## 2022-09-23 NOTE — Progress Notes (Signed)
R knee pain.  He twisted his knee last year.  He can feel/hear crepitus with ROM.  Chronic issue.  Worse in the last month, after getting on a ladder.  R knee was swelling prev but not red.  Rare use of nsaids.  No falls.  Gait is affected.   He has lateral medial and anterior pain.  Using blue emu cream for about 1 year.  No pain at rest but pain walking.    Diabetes:  No meds.   Hypoglycemic episodes: no  Hyperglycemic episodes: no Feet problems: no Blood Sugars averaging: ~130-150s eye exam within last year: yes, [facility].   A1c done at [facility].  6.9.  He is on antibiotics per outside clinic re: styes on R eyelids.    Meds, vitals, and allergies reviewed.   ROS: Per HPI unless specifically indicated in ROS section   GEN: nad, alert and oriented HEENT: ncat, right lids with styes noted baseline. NECK: supple w/o LA CV: rrr. PULM: ctab, no inc wob ABD: soft, +bs EXT: no edema SKIN: well perfused.   R knee puffy, ttp medial joint line.  ACL MCL LCL feel solid, patella not ttp.  + crepitus on ROM

## 2022-09-23 NOTE — Patient Instructions (Addendum)
Go to the lab on the way out.   If you have mychart we'll likely use that to update you.    Try using diclofenac gel in the meantime.  See if that helps the pain.  Ice for 5 minutes at a time as needed.   Take care.  Glad to see you.  It may be reasonable to see Dr. Patsy Lager.    Plan on recheck ~November 2024, yearly visit with labs ahead of time.

## 2022-09-24 ENCOUNTER — Ambulatory Visit (INDEPENDENT_AMBULATORY_CARE_PROVIDER_SITE_OTHER)
Admission: RE | Admit: 2022-09-24 | Discharge: 2022-09-24 | Disposition: A | Discharge status: Home / Self Care | Payer: Medicare Other | Source: Ambulatory Visit | Attending: Family Medicine | Admitting: Family Medicine

## 2022-09-24 DIAGNOSIS — M25561 Pain in right knee: Secondary | ICD-10-CM | POA: Diagnosis not present

## 2022-09-24 DIAGNOSIS — M25461 Effusion, right knee: Secondary | ICD-10-CM | POA: Diagnosis not present

## 2022-09-26 DIAGNOSIS — M25561 Pain in right knee: Secondary | ICD-10-CM | POA: Insufficient documentation

## 2022-09-26 DIAGNOSIS — H00019 Hordeolum externum unspecified eye, unspecified eyelid: Secondary | ICD-10-CM | POA: Insufficient documentation

## 2022-09-26 NOTE — Assessment & Plan Note (Signed)
A1c done at OV.  6.9. Continue work on diet and exercise as best he can.  Recheck periodically.  No change in medications today.  Taking pravastatin at baseline.

## 2022-09-26 NOTE — Assessment & Plan Note (Addendum)
See notes on imaging. Reasonable to try using diclofenac gel in the meantime.  He can see if that helps the pain.  Ice for 5 minutes at a time as needed.   It may be reasonable to see Dr. Patsy Lager if not better with diclofenac gel.

## 2022-09-26 NOTE — Assessment & Plan Note (Signed)
Per outside clinic.  I will defer. 

## 2022-09-30 ENCOUNTER — Other Ambulatory Visit: Payer: Self-pay | Admitting: Family Medicine

## 2022-09-30 DIAGNOSIS — R937 Abnormal findings on diagnostic imaging of other parts of musculoskeletal system: Secondary | ICD-10-CM

## 2022-10-01 ENCOUNTER — Ambulatory Visit (INDEPENDENT_AMBULATORY_CARE_PROVIDER_SITE_OTHER)
Admission: RE | Admit: 2022-10-01 | Discharge: 2022-10-01 | Disposition: A | Discharge status: Home / Self Care | Payer: Medicare Other | Source: Ambulatory Visit | Attending: Family Medicine | Admitting: Family Medicine

## 2022-10-01 ENCOUNTER — Other Ambulatory Visit: Payer: BLUE CROSS/BLUE SHIELD

## 2022-10-01 DIAGNOSIS — R937 Abnormal findings on diagnostic imaging of other parts of musculoskeletal system: Secondary | ICD-10-CM | POA: Diagnosis not present

## 2022-10-01 DIAGNOSIS — M25562 Pain in left knee: Secondary | ICD-10-CM | POA: Diagnosis not present

## 2022-10-14 DIAGNOSIS — H01003 Unspecified blepharitis right eye, unspecified eyelid: Secondary | ICD-10-CM | POA: Diagnosis not present

## 2022-10-14 DIAGNOSIS — H01006 Unspecified blepharitis left eye, unspecified eyelid: Secondary | ICD-10-CM | POA: Diagnosis not present

## 2022-11-30 ENCOUNTER — Ambulatory Visit
Admission: RE | Admit: 2022-11-30 | Discharge: 2022-11-30 | Disposition: A | Discharge status: Home / Self Care | Payer: Medicare Other | Source: Ambulatory Visit | Attending: Family Medicine | Admitting: Family Medicine

## 2022-11-30 DIAGNOSIS — Z122 Encounter for screening for malignant neoplasm of respiratory organs: Secondary | ICD-10-CM | POA: Diagnosis not present

## 2022-11-30 DIAGNOSIS — Z87891 Personal history of nicotine dependence: Secondary | ICD-10-CM | POA: Diagnosis not present

## 2022-12-08 ENCOUNTER — Other Ambulatory Visit: Payer: Self-pay

## 2022-12-08 DIAGNOSIS — Z122 Encounter for screening for malignant neoplasm of respiratory organs: Secondary | ICD-10-CM

## 2022-12-08 DIAGNOSIS — Z87891 Personal history of nicotine dependence: Secondary | ICD-10-CM

## 2023-01-24 DIAGNOSIS — D2262 Melanocytic nevi of left upper limb, including shoulder: Secondary | ICD-10-CM | POA: Diagnosis not present

## 2023-01-24 DIAGNOSIS — Z85828 Personal history of other malignant neoplasm of skin: Secondary | ICD-10-CM | POA: Diagnosis not present

## 2023-01-24 DIAGNOSIS — D2261 Melanocytic nevi of right upper limb, including shoulder: Secondary | ICD-10-CM | POA: Diagnosis not present

## 2023-01-24 DIAGNOSIS — D2271 Melanocytic nevi of right lower limb, including hip: Secondary | ICD-10-CM | POA: Diagnosis not present

## 2023-01-24 DIAGNOSIS — B078 Other viral warts: Secondary | ICD-10-CM | POA: Diagnosis not present

## 2023-01-24 DIAGNOSIS — D485 Neoplasm of uncertain behavior of skin: Secondary | ICD-10-CM | POA: Diagnosis not present

## 2023-01-24 DIAGNOSIS — B369 Superficial mycosis, unspecified: Secondary | ICD-10-CM | POA: Diagnosis not present

## 2023-01-24 DIAGNOSIS — L570 Actinic keratosis: Secondary | ICD-10-CM | POA: Diagnosis not present

## 2023-01-24 DIAGNOSIS — L089 Local infection of the skin and subcutaneous tissue, unspecified: Secondary | ICD-10-CM | POA: Diagnosis not present

## 2023-02-04 ENCOUNTER — Telehealth: Payer: Self-pay | Admitting: Family Medicine

## 2023-02-04 NOTE — Telephone Encounter (Signed)
Prescription Request  02/04/2023  LOV: 09/23/2022  What is the name of the medication or equipment? amLODipine (NORVASC) 5 MG tablet, no longer seeing cardiology  Have you contacted your pharmacy to request a refill? No   Which pharmacy would you like this sent to?  [id number] [place], [place] - [address] [place] [address] Phone: [phone] Fax: [phone]    Patient notified that their request is being sent to the clinical staff for review and that they should receive a response within 2 business days.   Please advise at [facility] [phone]

## 2023-02-04 NOTE — Telephone Encounter (Signed)
Given by Dr. Mariah Milling in the past but no longer seeing him. Okay to send?

## 2023-02-06 MED ORDER — AMLODIPINE BESYLATE 5 MG PO TABS: 5.0000 mg | ORAL_TABLET | Freq: Every day | ORAL | 3 refills | Status: DC

## 2023-02-06 NOTE — Telephone Encounter (Signed)
Sent. Thanks.   

## 2023-02-06 NOTE — Addendum Note (Signed)
Addended by: Joaquim Nam on: 02/06/2023 12:07 PM   Modules accepted: Orders

## 2023-02-14 DIAGNOSIS — B488 Other specified mycoses: Secondary | ICD-10-CM | POA: Diagnosis not present

## 2023-02-28 DIAGNOSIS — E119 Type 2 diabetes mellitus without complications: Secondary | ICD-10-CM | POA: Diagnosis not present

## 2023-03-20 ENCOUNTER — Other Ambulatory Visit: Payer: Self-pay | Admitting: Family Medicine

## 2023-03-20 DIAGNOSIS — E119 Type 2 diabetes mellitus without complications: Secondary | ICD-10-CM

## 2023-03-21 ENCOUNTER — Ambulatory Visit (INDEPENDENT_AMBULATORY_CARE_PROVIDER_SITE_OTHER): Payer: Medicare Other

## 2023-03-21 ENCOUNTER — Other Ambulatory Visit (INDEPENDENT_AMBULATORY_CARE_PROVIDER_SITE_OTHER): Payer: Medicare Other

## 2023-03-21 VITALS — Ht 71.0 in | Wt 199.0 lb

## 2023-03-21 DIAGNOSIS — E119 Type 2 diabetes mellitus without complications: Secondary | ICD-10-CM | POA: Diagnosis not present

## 2023-03-21 DIAGNOSIS — Z0000 Encounter for general adult medical examination without abnormal findings: Secondary | ICD-10-CM | POA: Diagnosis not present

## 2023-03-21 DIAGNOSIS — Z125 Encounter for screening for malignant neoplasm of prostate: Secondary | ICD-10-CM

## 2023-03-21 LAB — COMPREHENSIVE METABOLIC PANEL
ALT: 20 U/L (ref 0–53)
AST: 17 U/L (ref 0–37)
Albumin: 4.1 g/dL (ref 3.5–5.2)
Alkaline Phosphatase: 43 U/L (ref 39–117)
BUN: 19 mg/dL (ref 6–23)
CO2: 26 meq/L (ref 19–32)
Calcium: 9 mg/dL (ref 8.4–10.5)
Chloride: 106 meq/L (ref 96–112)
Creatinine, Ser: 1.31 mg/dL (ref 0.40–1.50)
GFR: 53.86 mL/min — ABNORMAL LOW (ref >60.00)
Glucose, Bld: 151 mg/dL — ABNORMAL HIGH (ref 70–99)
Potassium: 4.5 meq/L (ref 3.5–5.1)
Sodium: 142 meq/L (ref 135–145)
Total Bilirubin: 0.7 mg/dL (ref 0.2–1.2)
Total Protein: 6.7 g/dL (ref 6.0–8.3)

## 2023-03-21 LAB — MICROALBUMIN / CREATININE URINE RATIO
Creatinine,U: 410.2 mg/dL
Microalb Creat Ratio: 0.5 mg/g (ref 0.0–30.0)
Microalb, Ur: 1.9 mg/dL (ref 0.0–1.9)

## 2023-03-21 LAB — LIPID PANEL
Cholesterol: 121 mg/dL (ref 0–200)
HDL: 41.9 mg/dL (ref >39.00)
LDL Cholesterol: 65 mg/dL (ref 0–99)
NonHDL: 79.57
Total CHOL/HDL Ratio: 3
Triglycerides: 72 mg/dL (ref 0.0–149.0)
VLDL: 14.4 mg/dL (ref 0.0–40.0)

## 2023-03-21 LAB — CBC WITH DIFFERENTIAL/PLATELET
Basophils Absolute: 0.1 10*3/uL (ref 0.0–0.1)
Basophils Relative: 1 % (ref 0.0–3.0)
Eosinophils Absolute: 0.3 10*3/uL (ref 0.0–0.7)
Eosinophils Relative: 3.6 % (ref 0.0–5.0)
HCT: 43.7 % (ref 39.0–52.0)
Hemoglobin: 14.5 g/dL (ref 13.0–17.0)
Lymphocytes Relative: 26.8 % (ref 12.0–46.0)
Lymphs Abs: 2.4 10*3/uL (ref 0.7–4.0)
MCHC: 33.3 g/dL (ref 30.0–36.0)
MCV: 93.2 fL (ref 78.0–100.0)
Monocytes Absolute: 0.7 10*3/uL (ref 0.1–1.0)
Monocytes Relative: 7.5 % (ref 3.0–12.0)
Neutro Abs: 5.5 10*3/uL (ref 1.4–7.7)
Neutrophils Relative %: 61.1 % (ref 43.0–77.0)
Platelets: 188 10*3/uL (ref 150.0–400.0)
RBC: 4.69 Mil/uL (ref 4.22–5.81)
RDW: 13.2 % (ref 11.5–15.5)
WBC: 9 10*3/uL (ref 4.0–10.5)

## 2023-03-21 LAB — PSA, MEDICARE: PSA: 2.74 ng/mL (ref 0.10–4.00)

## 2023-03-21 LAB — HEMOGLOBIN A1C: Hgb A1c MFr Bld: 7 % — ABNORMAL HIGH (ref 4.6–6.5)

## 2023-03-21 NOTE — Patient Instructions (Signed)
Brian Hull , Thank you for taking time to come for your Medicare Wellness Visit. I appreciate your ongoing commitment to your health goals. Please review the following plan we discussed and let me know if I can assist you in the future.   Referrals/Orders/Follow-Ups/Clinician Recommendations: none  This is a list of the screening recommended for you and due dates:  Health Maintenance  Topic Date Due   Flu Shot  12/09/2022   Eye exam for diabetics  02/26/2023   Complete foot exam   03/26/2023   Hemoglobin A1C  09/18/2023   Screening for Lung Cancer  11/30/2023   Yearly kidney function blood test for diabetes  03/20/2024   Yearly kidney health urinalysis for diabetes  03/20/2024   Medicare Annual Wellness Visit  03/20/2024   Colon Cancer Screening  06/10/2027   DTaP/Tdap/Td vaccine (4 - Tdap) 01/27/2032   Pneumonia Vaccine  Completed   Hepatitis C Screening  Completed   Zoster (Shingles) Vaccine  Completed   HPV Vaccine  Aged Out   COVID-19 Vaccine  Discontinued    Advanced directives: (Provided) Advance directive discussed with you today. I have provided a copy for you to complete at home and have notarized. Once this is complete, please bring a copy in to our office so we can scan it into your chart.   Next Medicare Annual Wellness Visit scheduled for next year: Yes 03/22/24 @ 3:40pm telephone

## 2023-03-21 NOTE — Progress Notes (Signed)
Subjective:   Brian Hull. is a 72 y.o. male who presents for Medicare Annual/Subsequent preventive examination.  Visit Complete: Virtual I connected with  Brian Hull. on 03/21/23 by a audio enabled telemedicine application and verified that I am speaking with the correct person using two identifiers.  Patient Location: Home  Provider Location: Home Office  I discussed the limitations of evaluation and management by telemedicine. The patient expressed understanding and agreed to proceed.  Vital Signs: Because this visit was a virtual/telehealth visit, some criteria may be missing or patient reported. Any vitals not documented were not able to be obtained and vitals that have been documented are patient reported.  Patient Medicare AWV questionnaire was completed by the patient on (not done); I have confirmed that all information answered by patient is correct and no changes since this date.  Cardiac Risk Factors include: advanced age (>31men, >43 women);diabetes mellitus;hypertension;male gender;sedentary lifestyle     Objective:    Today's Vitals   03/21/23 1531  Weight: 199 lb (90.3 kg)  Height: 5\' 11"  (1.803 m)  PainSc: 1    Body mass index is 27.75 kg/m.     03/21/2023    3:54 PM 06/09/2022   12:57 PM 03/18/2022    9:17 AM 03/17/2021    9:49 AM 03/14/2020    9:48 AM 03/14/2019    2:46 PM 03/10/2018    2:27 PM  Advanced Directives  Does Patient Have a Medical Advance Directive? No No No No No No No  Does patient want to make changes to medical advance directive?     No - Patient declined    Would patient like information on creating a medical advance directive?   No - Patient declined Yes (MAU/Ambulatory/Procedural Areas - Information given)  No - Patient declined Yes (MAU/Ambulatory/Procedural Areas - Information given)    Current Medications (verified) Outpatient Encounter Medications as of 03/21/2023  Medication Sig   Accu-Chek Softclix Lancets lancets  Use daily as needed to check sugar.  Dx E11.9.   amLODipine (NORVASC) 5 MG tablet Take 1 tablet (5 mg total) by mouth daily.   blood glucose meter kit and supplies Dispense based on patient and insurance preference. Use up to four times daily as directed. (FOR ICD-10 E10.9, E11.9).   diclofenac Sodium (VOLTAREN) 1 % GEL Apply 2 g topically 4 (four) times daily as needed.   glucose blood (ACCU-CHEK GUIDE) test strip Use to check sugar daily as needed.  E11.9.   pantoprazole (PROTONIX) 40 MG tablet Take 1 tablet (40 mg total) by mouth daily.   pravastatin (PRAVACHOL) 10 MG tablet TAKE 1 TABLET BY MOUTH EVERY DAY   doxycycline (VIBRA-TABS) 100 MG tablet Take 100 mg by mouth 2 (two) times daily. (Patient not taking: Reported on 03/21/2023)   neomycin-polymyxin b-dexamethasone (MAXITROL) 3.5-10000-0.1 OINT SMARTSIG:1 Sparingly In Eye(s) Twice Daily (Patient not taking: Reported on 03/21/2023)   No facility-administered encounter medications on file as of 03/21/2023.    Allergies (verified) Azithromycin, [name], [name]   History: Past Medical History:  Diagnosis Date   Amputation, thumb, traumatic 1968   tip amputated    Barrett's esophagus 05/08/02, 02/15/05, 2009   EGD- Barrett's esoph, HH, gastritis, diverticulitis   Basal cell cancer 05/2000   of nose, face (seen at [facility] 2015)   Diabetes mellitus without complication (HCC)    Diet controlled   Emphysema lung (HCC)    Esophageal reflux    History of colon polyps  Hypertension    controlled on meds   Lipoma of unspecified site    PONV (postoperative nausea and vomiting)    as a 72 yr old   Pure hypercholesterolemia    Tobacco use disorder    uses snuff   Unspecified gastritis and gastroduodenitis without mention of hemorrhage    Wears dentures    upper   Wrist fracture, left    as a child   Past Surgical History:  Procedure Laterality Date   BROW LIFT Right 01/30/2021   Procedure: BLEPHAROPLASTY UPPER EYELID; W/EXCESS  SKIN BLEPHAROPTOSIS REPAIR; RESECT SKIN RIGHT;  Surgeon: Imagene Riches, MD;  Location: [facility];  Service: Ophthalmology;  Laterality: Right;   CATARACT EXTRACTION     2012   CHOLECYSTECTOMY, LAPAROSCOPIC  12/20/06   [place]   COLONOSCOPY N/A 06/09/2022   Procedure: COLONOSCOPY;  Surgeon: Toledo, Boykin Nearing, MD;  Location: [facility];  Service: Gastroenterology;  Laterality: N/A;   EYE SURGERY     trauma to right eye as a child, growth removed from eye 2 years later   Eyelid surgery Bilateral 06/2020   LAMINECTOMY  1984   L 4/5   UPPER GI ENDOSCOPY  9/205   Family History  Problem Relation Age of Onset   Diabetes Mother    Heart disease Mother        MI   Alzheimer's disease Mother        progressing   Cancer Father        possible colon   Diabetes Father    Heart disease Father        CHF   Stroke Father    Hypertension Father    Colon cancer Father        possible dx   Hypertension Sister    Colon polyps Sister    Cancer Sister        breast cancer   Cancer Brother        lung- smoker   Diabetes Brother    Colon polyps Brother    Hypertension Sister    Colon polyps Sister    Depression Neg Hx    Alcohol abuse Neg Hx    Drug abuse Neg Hx    Prostate cancer Neg Hx    Social History   Socioeconomic History   Marital status: Married    Spouse name: Not on file   Number of children: 3   Years of education: Not on file   Highest education level: Some college, no degree  Occupational History   Occupation: retired Theatre stage manager, on disability for back pain  Tobacco Use   Smoking status: Former    Current packs/day: 0.00    Average packs/day: 1 pack/day for 55.0 years (55.0 ttl pk-yrs)    Types: Cigarettes    Start date: 04/10/1962    Quit date: 04/10/2017    Years since quitting: 5.9   Smokeless tobacco: Current    Types: Snuff  Vaping Use   Vaping status: Former  Substance and Sexual Activity   Alcohol use: No    Alcohol/week: 0.0 standard  drinks of alcohol   Drug use: No   Sexual activity: Not on file  Other Topics Concern   Not on file  Social History Narrative   3 children initially- 2 sons locally, one daughter died at age 45 from tetrology of fallot.   [employer] [phone], domestic   Married 1970   Social Determinants of Corporate investment banker Strain: Low  Risk  (03/21/2023)   Overall Financial Resource Strain (CARDIA)    Difficulty of Paying Living Expenses: Not hard at all  Food Insecurity: No Food Insecurity (03/21/2023)   Hunger Vital Sign    Worried About Running Out of Food in the Last Year: Never true    Ran Out of Food in the Last Year: Never true  Transportation Needs: No Transportation Needs (03/21/2023)   PRAPARE - Administrator, Civil Service (Medical): No    Lack of Transportation (Non-Medical): No  Physical Activity: Inactive (03/21/2023)   Exercise Vital Sign    Days of Exercise per Week: 0 days    Minutes of Exercise per Session: 0 min  Stress: No Stress Concern Present (03/21/2023)   [employer] of Occupational Health - Occupational Stress Questionnaire    Feeling of Stress : Not at all  Social Connections: Moderately Integrated (03/21/2023)   Social Connection and Isolation Panel [NHANES]    Frequency of Communication with Friends and Family: More than three times a week    Frequency of Social Gatherings with Friends and Family: Once a week    Attends Religious Services: More than 4 times per year    Active Member of [employer] or Organizations: No    Attends Engineer, structural: Never    Marital Status: Married    Tobacco Counseling Ready to quit: Not Answered Counseling given: Not Answered  Clinical Intake:  Pre-visit preparation completed: No  Pain : 0-10 Pain Score: 1  Pain Type: Chronic pain Pain Location: Knee Pain Orientation: Right Pain Descriptors / Indicators: Aching Pain Onset: More than a month ago Pain Frequency: Intermittent Pain Relieving  Factors: voltaren gel  Pain Relieving Factors: voltaren gel  BMI - recorded: 27.75 Nutritional Status: BMI 25 -29 Overweight Nutritional Risks: None Diabetes: Yes CBG done?: Yes (BS 147 this morning at home) CBG resulted in Enter/ Edit results?: No Did pt. bring in CBG monitor from home?: No  How often do you need to have someone help you when you read instructions, pamphlets, or other written materials from your doctor or pharmacy?: 1 - Never  Interpreter Needed?: No  Comments: lives with wife Information entered by :: B.Katheryn Culliton,LPN  Activities of Daily Living    03/21/2023    3:55 PM  In your present state of health, do you have any difficulty performing the following activities:  Hearing? 1  Vision? 0  Difficulty concentrating or making decisions? 0  Walking or climbing stairs? 0  Dressing or bathing? 0  Doing errands, shopping? 0  Preparing Food and eating ? N  Using the Toilet? N  In the past six months, have you accidently leaked urine? N  Do you have problems with loss of bowel control? N  Managing your Medications? N  Managing your Finances? N  Housekeeping or managing your Housekeeping? N    Patient Care Team: Joaquim Nam, MD as PCP - General Mariah Milling Tollie Pizza, MD as PCP - Cardiology (Cardiology)  Indicate any recent Medical Services you may have received from other than [facility] providers in the past year (date may be approximate).     Assessment:   This is a routine wellness examination for Brian Hull.  Hearing/Vision screen Hearing Screening - Comments:: Pt says hearing is getting worse Audiology referral Vision Screening - Comments:: Pt says his vision is real good;cataracts surgery in both eyes [facility] in October    Goals Addressed   None    Depression Screen  03/21/2023    3:50 PM 09/23/2022   11:29 AM 03/18/2022    9:15 AM 03/17/2021    9:53 AM 03/14/2020    9:50 AM 03/14/2019    2:48 PM 03/10/2018    2:26 PM  PHQ 2/9 Scores   PHQ - 2 Score 0 0 0 0 0 0 0  PHQ- 9 Score  0 0  0 0 0    Fall Risk    03/21/2023    3:41 PM 09/23/2022   11:29 AM 03/18/2022    9:17 AM 03/17/2021    9:51 AM 03/14/2020    9:50 AM  Fall Risk   Falls in the past year? 0 0 0 0 0  Number falls in past yr: 0 0 0 0 0  Injury with Fall? 0 0 0 0 0  Risk for fall due to : No Fall Risks No Fall Risks No Fall Risks No Fall Risks No Fall Risks  Follow up Education provided;Falls prevention discussed Falls evaluation completed Falls prevention discussed;Falls evaluation completed  Falls evaluation completed;Falls prevention discussed    MEDICARE RISK AT HOME: Medicare Risk at Home Any stairs in or around the home?: Yes If so, are there any without handrails?: Yes Home free of loose throw rugs in walkways, pet beds, electrical cords, etc?: Yes Adequate lighting in your home to reduce risk of falls?: Yes Life alert?: No Use of a cane, walker or w/c?: No Grab bars in the bathroom?: No Shower chair or bench in shower?: No Elevated toilet seat or a handicapped toilet?: Yes  TIMED UP AND GO:  Was the test performed?  No    Cognitive Function:    03/14/2020    9:53 AM 03/14/2019    2:52 PM 03/10/2018    2:27 PM  MMSE - Mini Mental State Exam  Orientation to time 5 5 5   Orientation to Place 5 5 5   Registration 3 3 3   Attention/ Calculation 5 5 0  Recall 3 3 3   Language- name 2 objects   0  Language- repeat 1 1 1   Language- follow 3 step command   3  Language- read & follow direction   0  Write a sentence   0  Copy design   0  Total score   20        03/21/2023    4:03 PM 03/18/2022    9:21 AM  6CIT Screen  What Year? 0 points 0 points  What month? 0 points 0 points  What time? 0 points 0 points  Count back from 20 0 points 0 points  Months in reverse 0 points 0 points  Repeat phrase 0 points 0 points  Total Score 0 points 0 points    Immunizations Immunization History  Administered Date(s) Administered   Fluad Quad(high  Dose 65+) 01/09/2019, 03/17/2020, 03/23/2021, 03/25/2022   Influenza Split 02/14/2012, 02/15/2014   Influenza,inj,Quad PF,6+ Mos 02/27/2013, 03/03/2015, 03/04/2016, 03/08/2017, 02/09/2018   PFIZER(Purple Top)SARS-COV-2 Vaccination 07/24/2019, 08/14/2019   Pneumococcal Conjugate-13 03/03/2015   Pneumococcal Polysaccharide-23 03/04/2016   Td 05/11/1996, 01/25/2007   Td (Adult),5 Lf Tetanus Toxid, Preservative Free 01/26/2022   Zoster Recombinant(Shingrix) 10/13/2021, 12/13/2021   Zoster, Live 05/21/2011    TDAP status: Up to date  Flu Vaccine status: Due, Education has been provided regarding the importance of this vaccine. Advised may receive this vaccine at local pharmacy or Health Dept. Aware to provide a copy of the vaccination record if obtained from local pharmacy or Health Dept. Verbalized  acceptance and understanding.  Pneumococcal vaccine status: Up to date  Covid-19 vaccine status: Completed vaccines  Qualifies for Shingles Vaccine? Yes   Zostavax completed Yes   Shingrix Completed?: Yes  Screening Tests Health Maintenance  Topic Date Due   INFLUENZA VACCINE  12/09/2022   OPHTHALMOLOGY EXAM  02/26/2023   FOOT EXAM  03/26/2023   HEMOGLOBIN A1C  09/18/2023   Lung Cancer Screening  11/30/2023   Diabetic kidney evaluation - eGFR measurement  03/20/2024   Diabetic kidney evaluation - Urine ACR  03/20/2024   Medicare Annual Wellness (AWV)  03/20/2024   Colonoscopy  06/10/2027   DTaP/Tdap/Td (4 - Tdap) 01/27/2032   Pneumonia Vaccine 70+ Years old  Completed   Hepatitis C Screening  Completed   Zoster Vaccines- Shingrix  Completed   HPV VACCINES  Aged Out   COVID-19 Vaccine  Discontinued    Health Maintenance  Health Maintenance Due  Topic Date Due   INFLUENZA VACCINE  12/09/2022   OPHTHALMOLOGY EXAM  02/26/2023    Colorectal cancer screening: Type of screening: Colonoscopy. Completed 06/09/22. Repeat every 5 years  Lung Cancer Screening: (Low Dose CT Chest  recommended if Age 39-80 years, 20 pack-year currently smoking OR have quit w/in 15years.) does not qualify.   Lung Cancer Screening Referral: no  Additional Screening:  Hepatitis C Screening: does not qualify; Completed   Vision Screening: Recommended annual ophthalmology exams for early detection of glaucoma and other disorders of the eye. Is the patient up to date with their annual eye exam?  Yes  Who is the provider or what is the name of the office in which the patient attends annual eye exams? Dr Jean Rosenthal If pt is not established with a provider, would they like to be referred to a provider to establish care? No .   Dental Screening: Recommended annual dental exams for proper oral hygiene  Diabetic Foot Exam: Diabetic Foot Exam: Completed 03/2022  Community Resource Referral / Chronic Care Management: CRR required this visit?  No   CCM required this visit?  No   Plan:     I have personally reviewed and noted the following in the patient's chart:   Medical and social history Use of alcohol, tobacco or illicit drugs  Current medications and supplements including opioid prescriptions. Patient is not currently taking opioid prescriptions. Functional ability and status Nutritional status Physical activity Advanced directives List of other physicians Hospitalizations, surgeries, and ER visits in previous 12 months Vitals Screenings to include cognitive, depression, and falls Referrals and appointments  In addition, I have reviewed and discussed with patient certain preventive protocols, quality metrics, and best practice recommendations. A written personalized care plan for preventive services as well as general preventive health recommendations were provided to patient.   Sue Lush, LPN   [id number]   After Visit Summary: (MyChart) Due to this being a telephonic visit, the after visit summary with patients personalized plan was offered to patient via MyChart    Nurse Notes: The patient states he is doing alright other than the last two week s he has been experiencing SOB upon exertion. He relays he had the same symptom/experience two years ago. He was sent for work-up (which he says was negative). He has no other concerns or questions at this time.

## 2023-03-28 ENCOUNTER — Ambulatory Visit (INDEPENDENT_AMBULATORY_CARE_PROVIDER_SITE_OTHER): Payer: Medicare Other | Admitting: Family Medicine

## 2023-03-28 ENCOUNTER — Encounter: Payer: Self-pay | Admitting: Family Medicine

## 2023-03-28 VITALS — BP 118/60 | HR 94 | Temp 97.7°F | Ht 71.0 in | Wt 204.2 lb

## 2023-03-28 DIAGNOSIS — K22719 Barrett's esophagus with dysplasia, unspecified: Secondary | ICD-10-CM | POA: Diagnosis not present

## 2023-03-28 DIAGNOSIS — Z0000 Encounter for general adult medical examination without abnormal findings: Secondary | ICD-10-CM

## 2023-03-28 DIAGNOSIS — J449 Chronic obstructive pulmonary disease, unspecified: Secondary | ICD-10-CM

## 2023-03-28 DIAGNOSIS — R0602 Shortness of breath: Secondary | ICD-10-CM | POA: Diagnosis not present

## 2023-03-28 DIAGNOSIS — I10 Essential (primary) hypertension: Secondary | ICD-10-CM

## 2023-03-28 DIAGNOSIS — Z23 Encounter for immunization: Secondary | ICD-10-CM

## 2023-03-28 DIAGNOSIS — E119 Type 2 diabetes mellitus without complications: Secondary | ICD-10-CM | POA: Diagnosis not present

## 2023-03-28 DIAGNOSIS — Z7189 Other specified counseling: Secondary | ICD-10-CM

## 2023-03-28 LAB — BRAIN NATRIURETIC PEPTIDE: Pro B Natriuretic peptide (BNP): 109 pg/mL — ABNORMAL HIGH (ref 0.0–100.0)

## 2023-03-28 MED ORDER — ALBUTEROL SULFATE HFA 108 (90 BASE) MCG/ACT IN AERS: 1.0000 | INHALATION_SPRAY | Freq: Four times a day (QID) | RESPIRATORY_TRACT | 2 refills | Status: AC | PRN

## 2023-03-28 MED ORDER — BUDESONIDE-FORMOTEROL FUMARATE 80-4.5 MCG/ACT IN AERO: 2.0000 | INHALATION_SPRAY | Freq: Two times a day (BID) | RESPIRATORY_TRACT | 3 refills | Status: DC

## 2023-03-28 NOTE — Patient Instructions (Signed)
Use symbicort twice a day and rinse after use.   Albuterol used only if needed.    Update me in about 1 week, sooner if needed.   Go to the lab on the way out.   If you have mychart we'll likely use that to update you.    Take care.  Glad to see you.

## 2023-03-28 NOTE — Progress Notes (Unsigned)
Diabetes:  No meds.   Hypoglycemic episodes: no Hyperglycemic episodes: no Feet problems: no Blood Sugars averaging: ~140s, occ higher.   eye exam within last year: yes- Dr. Jean Rosenthal at [facility] in [place].   Labs d/w pt.     Barrett's.  Still on PPI.  Usually swallowing well.  Rationale for PPI use discussed with patient.   He has CT scanning of lungs ongoing, yearly.   Shingles prev done Tetanus 2023.   PNA up to date Flu 2024 covid vaccine 2021.   PSA wnl 2024 Advance directive- wife designated if patient were incapacitated. Colonoscopy 2024     Hypertension:               Using medication without problems or lightheadedness:  yes Chest pain with exertion:no Edema:no Short of breath:noted in the last 3 weeks. With exertion.   Unclear if seasonal trigger for respiratory sx.    CT chest this year: Cardiovascular: Heart size is normal. There is no significant pericardial fluid, thickening or pericardial calcification. Aortic atherosclerosis. No definite coronary artery calcifications.  R shoulder and neck pain.  Muscle tightness locally, on the R side.  Fell asleep watching TV then it started.  Voltarel gel helped.  Discussed gently stretching for likely musculoskeletal source.  Intermittent abx ointment use, not daily.   Meds, vitals, and allergies reviewed.   ROS: Per HPI unless specifically indicated in ROS section   GEN: nad, alert and oriented HEENT: ncat NECK: supple w/o LA CV: rrr.  PULM: ctab, no inc wob ABD: soft, +bs EXT: no edema SKIN: no acute rash Tightness at the superior portion of the right trapezius.  No rash.  Diabetic foot exam: Normal inspection No skin breakdown No calluses  Normal DP pulses Normal sensation to light touch and monofilament Nails normal

## 2023-03-30 NOTE — Assessment & Plan Note (Signed)
He has CT scanning of lungs ongoing, yearly.   Shingles prev done Tetanus 2023.   PNA up to date Flu 2024 covid vaccine 2021.   PSA wnl 2024 Advance directive- wife designated if patient were incapacitated. Colonoscopy 2024

## 2023-03-30 NOTE — Assessment & Plan Note (Signed)
Still on PPI.  Usually swallowing well.  Rationale for PPI use discussed with patient. Continue PPI.

## 2023-03-30 NOTE — Assessment & Plan Note (Addendum)
He has had some shortness of breath over the last 3 months and this could have been seasonal/environmental allergy.  Discussed options.  Recent CT chest shows reassuring findings regarding his heart.  Check BNP.  See notes on labs.  Reasonable to use symbicort twice a day and rinse after use.  Albuterol used only if needed.   Update me in about 1 week, sooner if needed.  He agrees to plan.

## 2023-03-30 NOTE — Assessment & Plan Note (Signed)
Continue amlodipine.  Labs discussed with patient.

## 2023-03-30 NOTE — Assessment & Plan Note (Signed)
Advance directive- wife designated if patient were incapacitated.  

## 2023-03-30 NOTE — Assessment & Plan Note (Addendum)
Labs d/w pt.   Recheck periodically.  No medications changes at this point.  Continue work on diet and exercise.  Continue pravastatin.

## 2023-03-31 ENCOUNTER — Other Ambulatory Visit: Payer: Self-pay | Admitting: Family Medicine

## 2023-03-31 NOTE — Telephone Encounter (Signed)
Notify pt.  Symbicort wasn't approved, reasonable to try spiriva.  Rx sent.  Thanks.

## 2023-04-01 NOTE — Telephone Encounter (Signed)
Patient called in stating that the spiriva is costing 486 dollars even with  his insurance.She said that he is not going to get filled,and he will just hold off on anything other than the albuterol and see if his issue is seasonal because he really only have episodes when he's out working in the yard.

## 2023-04-01 NOTE — Telephone Encounter (Signed)
Patient notified

## 2023-04-03 NOTE — Telephone Encounter (Signed)
Noted. Thanks.  Will await update from patient.

## 2023-04-04 ENCOUNTER — Encounter: Payer: Self-pay | Admitting: Internal Medicine

## 2023-04-04 ENCOUNTER — Ambulatory Visit (INDEPENDENT_AMBULATORY_CARE_PROVIDER_SITE_OTHER): Payer: Medicare Other | Admitting: Internal Medicine

## 2023-04-04 ENCOUNTER — Ambulatory Visit
Admission: RE | Admit: 2023-04-04 | Discharge: 2023-04-04 | Disposition: A | Discharge status: Home / Self Care | Payer: Medicare Other | Source: Ambulatory Visit | Attending: Internal Medicine | Admitting: Internal Medicine

## 2023-04-04 VITALS — BP 118/70 | HR 89 | Temp 98.4°F | Ht 71.0 in | Wt 203.0 lb

## 2023-04-04 DIAGNOSIS — M7989 Other specified soft tissue disorders: Secondary | ICD-10-CM | POA: Insufficient documentation

## 2023-04-04 DIAGNOSIS — Z86718 Personal history of other venous thrombosis and embolism: Secondary | ICD-10-CM | POA: Insufficient documentation

## 2023-04-04 DIAGNOSIS — I82412 Acute embolism and thrombosis of left femoral vein: Secondary | ICD-10-CM | POA: Diagnosis not present

## 2023-04-04 DIAGNOSIS — I82432 Acute embolism and thrombosis of left popliteal vein: Secondary | ICD-10-CM | POA: Diagnosis not present

## 2023-04-04 MED ORDER — APIXABAN (ELIQUIS) VTE STARTER PACK (10MG AND 5MG): ORAL_TABLET | ORAL | 0 refills | Status: DC

## 2023-04-04 NOTE — Assessment & Plan Note (Signed)
Findings more consistent with ruptured Baker's cyst (no tenderness or distal swelling) but could be DVT Will get stat duplex scan Start eliquis and consider hypercoaguable work up if positive

## 2023-04-04 NOTE — Addendum Note (Signed)
Addended by: Tillman Abide I on: 04/04/2023 04:55 PM   Modules accepted: Orders

## 2023-04-04 NOTE — Progress Notes (Signed)
Subjective:    Patient ID: [name], male    DOB: [birth year], 72 y.o.   MRN: [id number]  HPI Here with wife due to left calf tightness  Yesterday after church--noticed left leg "just didn't feel right" Wife checked---and it was swollen and tight Notes in mostly in the back--but also discomfort in the shin Nothing above the knee--but has some pulling sensation along medial distal leg  No recent injury No history of blood clots--but his father had them (on warfarin) No recent travel Keeps legs up when in recliner  Current Outpatient Medications on File Prior to Visit  Medication Sig Dispense Refill   Accu-Chek Softclix Lancets lancets Use daily as needed to check sugar.  Dx E11.9. 100 each 11   albuterol (VENTOLIN HFA) 108 (90 Base) MCG/ACT inhaler Inhale 1-2 puffs into the lungs every 6 (six) hours as needed for wheezing or shortness of breath. 8 g 2   amLODipine (NORVASC) 5 MG tablet Take 1 tablet (5 mg total) by mouth daily. 90 tablet 3   blood glucose meter kit and supplies Dispense based on patient and insurance preference. Use up to four times daily as directed. (FOR ICD-10 E10.9, E11.9). 1 each 0   diclofenac Sodium (VOLTAREN) 1 % GEL Apply 2 g topically 4 (four) times daily as needed.     glucose blood (ACCU-CHEK GUIDE) test strip Use to check sugar daily as needed.  E11.9. 100 strip 11   neomycin-polymyxin b-dexamethasone (MAXITROL) 3.5-10000-0.1 OINT      pantoprazole (PROTONIX) 40 MG tablet Take 1 tablet (40 mg total) by mouth daily. 90 tablet 3   pravastatin (PRAVACHOL) 10 MG tablet TAKE 1 TABLET BY MOUTH EVERY DAY 90 tablet 3   Tiotropium Bromide Monohydrate (SPIRIVA RESPIMAT) 2.5 MCG/ACT AERS Inhale 2 puffs into the lungs daily. (Patient not taking: Reported on 04/04/2023) 4 g 1   No current facility-administered medications on file prior to visit.    Allergies  Allergen Reactions   Azithromycin Rash   [name] Other (See Comments)    Edema at higher doses.   Not a true allergy o/w.     Tolmetin     Other reaction(s): Other (See Comments) Edema at higher doses.  Not a true allergy o/w.      Past Medical History:  Diagnosis Date   Amputation, thumb, traumatic 1968   tip amputated    Barrett's esophagus 05/08/02, 02/15/05, 2009   EGD- Barrett's esoph, HH, gastritis, diverticulitis   Basal cell cancer 05/2000   of nose, face (seen at [facility] 2015)   Diabetes mellitus without complication (HCC)    Diet controlled   Emphysema lung (HCC)    Esophageal reflux    History of colon polyps    Hypertension    controlled on meds   Lipoma of unspecified site    PONV (postoperative nausea and vomiting)    as a 72 yr old   Pure hypercholesterolemia    Tobacco use disorder    uses snuff   Unspecified gastritis and gastroduodenitis without mention of hemorrhage    Wears dentures    upper   Wrist fracture, left    as a child    Past Surgical History:  Procedure Laterality Date   BROW LIFT Right 01/30/2021   Procedure: BLEPHAROPLASTY UPPER EYELID; W/EXCESS SKIN BLEPHAROPTOSIS REPAIR; RESECT SKIN RIGHT;  Surgeon: Imagene Riches, MD;  Location: [facility];  Service: Ophthalmology;  Laterality: Right;   CATARACT EXTRACTION  2012   CHOLECYSTECTOMY, LAPAROSCOPIC  12/20/06   [place]   COLONOSCOPY N/A 06/09/2022   Procedure: COLONOSCOPY;  Surgeon: Toledo, Boykin Nearing, MD;  Location: [facility];  Service: Gastroenterology;  Laterality: N/A;   EYE SURGERY     trauma to right eye as a child, growth removed from eye 2 years later   Eyelid surgery Bilateral 06/2020   LAMINECTOMY  1984   L 4/5   UPPER GI ENDOSCOPY  9/205    Family History  Problem Relation Age of Onset   Diabetes Mother    Heart disease Mother        MI   Alzheimer's disease Mother        progressing   Cancer Father        possible colon   Diabetes Father    Heart disease Father        CHF   Stroke Father    Hypertension Father    Colon cancer Father         possible dx   Hypertension Sister    Colon polyps Sister    Cancer Sister        breast cancer   Cancer Brother        lung- smoker   Diabetes Brother    Colon polyps Brother    Hypertension Sister    Colon polyps Sister    Depression Neg Hx    Alcohol abuse Neg Hx    Drug abuse Neg Hx    Prostate cancer Neg Hx     Social History   Socioeconomic History   Marital status: Married    Spouse name: Not on file   Number of children: 3   Years of education: Not on file   Highest education level: Some college, no degree  Occupational History   Occupation: retired Theatre stage manager, on disability for back pain  Tobacco Use   Smoking status: Former    Current packs/day: 0.00    Average packs/day: 1 pack/day for 55.0 years (55.0 ttl pk-yrs)    Types: Cigarettes    Start date: 04/10/1962    Quit date: 04/10/2017    Years since quitting: 5.9   Smokeless tobacco: Current    Types: Snuff  Vaping Use   Vaping status: Former  Substance and Sexual Activity   Alcohol use: No    Alcohol/week: 0.0 standard drinks of alcohol   Drug use: No   Sexual activity: Not on file  Other Topics Concern   Not on file  Social History Narrative   3 children initially- 2 sons locally, one daughter died at age 70 from tetrology of fallot.   [employer] [phone], domestic   Married 1970   Social Determinants of Health   Financial Resource Strain: Low Risk  (03/21/2023)   Overall Financial Resource Strain (CARDIA)    Difficulty of Paying Living Expenses: Not hard at all  Food Insecurity: No Food Insecurity (03/21/2023)   Hunger Vital Sign    Worried About Running Out of Food in the Last Year: Never true    Ran Out of Food in the Last Year: Never true  Transportation Needs: No Transportation Needs (03/21/2023)   PRAPARE - Administrator, Civil Service (Medical): No    Lack of Transportation (Non-Medical): No  Physical Activity: Inactive (03/21/2023)   Exercise Vital Sign    Days of Exercise per  Week: 0 days    Minutes of Exercise per Session: 0 min  Stress: No Stress Concern  Present (03/21/2023)   [employer] of Occupational Health - Occupational Stress Questionnaire    Feeling of Stress : Not at all  Social Connections: Moderately Integrated (03/21/2023)   Social Connection and Isolation Panel [NHANES]    Frequency of Communication with Friends and Family: More than three times a week    Frequency of Social Gatherings with Friends and Family: Once a week    Attends Religious Services: More than 4 times per year    Active Member of [employer] or Organizations: No    Attends Banker Meetings: Never    Marital Status: Married  Catering manager Violence: Not At Risk (03/21/2023)   Humiliation, Afraid, Rape, and Kick questionnaire    Fear of Current or Ex-Partner: No    Emotionally Abused: No    Physically Abused: No    Sexually Abused: No   Review of Systems No chest pain Some SOB--which seems to be improving with inhaler (exertional) Not taking spiriva--was $480     Objective:   Physical Exam Constitutional:      Appearance: Normal appearance.  Cardiovascular:     Rate and Rhythm: Normal rate and regular rhythm.     Heart sounds: No murmur heard.    No gallop.  Pulmonary:     Effort: Pulmonary effort is normal.     Breath sounds: Normal breath sounds. No wheezing or rales.  Musculoskeletal:     Cervical back: Neck supple.     Comments: No knee swelling No ankle/foot swelling Left calf has 2+ tense swelling without redness or tenderness Right calf is normal  Lymphadenopathy:     Cervical: No cervical adenopathy.  Neurological:     Mental Status: He is alert.            Assessment & Plan:

## 2023-04-04 NOTE — Assessment & Plan Note (Addendum)
Unprovoked DVT of femoral, common femoral, popliteal and calf veins Called wife with him in the background after getting the report from the radiiologist Will start eliquis started pack Referral to hematology to evaluate since unprovoked Will set up follow up with Dr Para March for next week To ER if chest pain/SOB  Eliquis/xarelto very expensive--may be able to dabigatran after the initial Rx

## 2023-04-11 ENCOUNTER — Inpatient Hospital Stay: Payer: Medicare Other

## 2023-04-11 ENCOUNTER — Encounter: Payer: Self-pay | Admitting: Oncology

## 2023-04-11 ENCOUNTER — Inpatient Hospital Stay: Payer: Medicare Other | Attending: Oncology | Admitting: Oncology

## 2023-04-11 VITALS — BP 132/68 | HR 85 | Temp 98.3°F | Resp 18 | Wt 202.3 lb

## 2023-04-11 DIAGNOSIS — Z79899 Other long term (current) drug therapy: Secondary | ICD-10-CM | POA: Insufficient documentation

## 2023-04-11 DIAGNOSIS — I82412 Acute embolism and thrombosis of left femoral vein: Secondary | ICD-10-CM

## 2023-04-11 DIAGNOSIS — I2510 Atherosclerotic heart disease of native coronary artery without angina pectoris: Secondary | ICD-10-CM | POA: Insufficient documentation

## 2023-04-11 DIAGNOSIS — K219 Gastro-esophageal reflux disease without esophagitis: Secondary | ICD-10-CM | POA: Diagnosis not present

## 2023-04-11 DIAGNOSIS — Z7901 Long term (current) use of anticoagulants: Secondary | ICD-10-CM | POA: Diagnosis not present

## 2023-04-11 DIAGNOSIS — Z87891 Personal history of nicotine dependence: Secondary | ICD-10-CM | POA: Diagnosis not present

## 2023-04-11 DIAGNOSIS — Z800 Family history of malignant neoplasm of digestive organs: Secondary | ICD-10-CM

## 2023-04-11 DIAGNOSIS — I82432 Acute embolism and thrombosis of left popliteal vein: Secondary | ICD-10-CM | POA: Diagnosis not present

## 2023-04-11 DIAGNOSIS — I10 Essential (primary) hypertension: Secondary | ICD-10-CM | POA: Diagnosis not present

## 2023-04-11 DIAGNOSIS — E119 Type 2 diabetes mellitus without complications: Secondary | ICD-10-CM | POA: Insufficient documentation

## 2023-04-11 DIAGNOSIS — E7800 Pure hypercholesterolemia, unspecified: Secondary | ICD-10-CM | POA: Insufficient documentation

## 2023-04-11 DIAGNOSIS — R918 Other nonspecific abnormal finding of lung field: Secondary | ICD-10-CM

## 2023-04-11 MED ORDER — APIXABAN 5 MG PO TABS: 5.0000 mg | ORAL_TABLET | Freq: Two times a day (BID) | ORAL | 3 refills | Status: DC

## 2023-04-11 NOTE — Addendum Note (Signed)
Addended by: Corene Cornea on: 04/11/2023 01:12 PM   Modules accepted: Orders

## 2023-04-11 NOTE — Progress Notes (Signed)
Hematology/Oncology Consult note [facility] [phone] [phone]  Patient Care Team: Joaquim Nam, MD as PCP - General Mariah Milling Tollie Pizza, MD as PCP - Cardiology (Cardiology) Pa, [facility]   Name of the patient: Brian Hull  [id number]  1950/05/22    Reason for referral-acute DVT   Referring physician-Dr. Para March  Date of visit: 04/11/23   History of presenting illness- Patient is a 72 year old male with a past medical history significant for hypertension COPD GERD who underwent left lower extremity Doppler for left calf swelling on 04/04/2023.  Doppler showed acute DVT involving the left common femoral popliteal and calf veins.  Prior to that he had a CT chest lung cancer screening program which did not show any concerning findings for malignancy. Patient with started on Eliquis for his DVT and has been referred to [place] for further management.  He had sudden onset left calf swelling 7 days ago.  A day after patient saw his PCP and underwent Doppler which showed about DVT.  No prior history of DVT.  Family history significant for DVT in his brother who was also diagnosed with polycythemia vera.  Family history significant for pancreatic cancer in his uncle and breast cancer in his sister.  He denies any changes in his appetite and weight recently.  Denies any abdominal pain  ECOG PS- 0  Pain scale- 0   Review of systems- Review of Systems  Constitutional:  Negative for chills, fever, malaise/fatigue and weight loss.  HENT:  Negative for congestion, ear discharge and nosebleeds.   Eyes:  Negative for blurred vision.  Respiratory:  Negative for cough, hemoptysis, sputum production, shortness of breath and wheezing.   Cardiovascular:  Negative for chest pain, palpitations, orthopnea and claudication.  Gastrointestinal:  Negative for abdominal pain, blood in stool, constipation, diarrhea, heartburn, melena, nausea and  vomiting.  Genitourinary:  Negative for dysuria, flank pain, frequency, hematuria and urgency.  Musculoskeletal:  Negative for back pain, joint pain and myalgias.  Skin:  Negative for rash.  Neurological:  Negative for dizziness, tingling, focal weakness, seizures, weakness and headaches.  Endo/Heme/Allergies:  Does not bruise/bleed easily.  Psychiatric/Behavioral:  Negative for depression and suicidal ideas. The patient does not have insomnia.     Allergies  Allergen Reactions   Azithromycin Rash   [name] Other (See Comments)    Edema at higher doses.  Not a true allergy o/w.     Tolmetin     Other reaction(s): Other (See Comments) Edema at higher doses.  Not a true allergy o/w.      Patient Active Problem List   Diagnosis Date Noted   Calf swelling 04/04/2023   Acute deep vein thrombosis (DVT) of left femoral vein (HCC) 04/04/2023   Stye 09/26/2022   Right knee pain 09/26/2022   SOBOE (shortness of breath on exertion) 10/08/2020   Hiatal hernia 10/07/2020   Hypertension 10/07/2020   Rhinitis 06/22/2020   Vision changes 09/17/2018   Health care maintenance 03/12/2018   Back pain 03/12/2018   Coronary artery disease involving native heart without angina pectoris 10/19/2017   Edema 11/01/2014   Advance care planning 03/01/2014   Facial pain 02/27/2013   Basal cell carcinoma of skin of unspecified parts of face 11/01/2012   Diabetes mellitus without complication (HCC) 02/14/2011   Medicare annual wellness visit, subsequent 02/14/2011   Barrett's esophagus 12/13/2010   COPD (chronic obstructive pulmonary disease) (HCC) 10/23/2010   FATIGUE 01/22/2010   NICOTINE ADDICTION 01/16/2007  History of colonic polyps 01/16/2007   GERD, SEVERE 11/08/2006     Past Medical History:  Diagnosis Date   Amputation, thumb, traumatic 1968   tip amputated    Barrett's esophagus 05/08/02, 02/15/05, 2009   EGD- Barrett's esoph, HH, gastritis, diverticulitis   Basal cell cancer 05/2000    of nose, face (seen at [facility] 2015)   Diabetes mellitus without complication (HCC)    Diet controlled   Emphysema lung (HCC)    Esophageal reflux    History of colon polyps    Hypertension    controlled on meds   Lipoma of unspecified site    PONV (postoperative nausea and vomiting)    as a 72 yr old   Pure hypercholesterolemia    Tobacco use disorder    uses snuff   Unspecified gastritis and gastroduodenitis without mention of hemorrhage    Wears dentures    upper   Wrist fracture, left    as a child     Past Surgical History:  Procedure Laterality Date   BROW LIFT Right 01/30/2021   Procedure: BLEPHAROPLASTY UPPER EYELID; W/EXCESS SKIN BLEPHAROPTOSIS REPAIR; RESECT SKIN RIGHT;  Surgeon: Imagene Riches, MD;  Location: [facility];  Service: Ophthalmology;  Laterality: Right;   CATARACT EXTRACTION     2012   CHOLECYSTECTOMY, LAPAROSCOPIC  12/20/06   [place]   COLONOSCOPY N/A 06/09/2022   Procedure: COLONOSCOPY;  Surgeon: Toledo, Boykin Nearing, MD;  Location: [facility];  Service: Gastroenterology;  Laterality: N/A;   EYE SURGERY     trauma to right eye as a child, growth removed from eye 2 years later   Eyelid surgery Bilateral 06/2020   LAMINECTOMY  1984   L 4/5   UPPER GI ENDOSCOPY  9/205    Social History   Socioeconomic History   Marital status: Married    Spouse name: Not on file   Number of children: 3   Years of education: Not on file   Highest education level: Some college, no degree  Occupational History   Occupation: retired Theatre stage manager, on disability for back pain  Tobacco Use   Smoking status: Former    Current packs/day: 0.00    Average packs/day: 1 pack/day for 55.0 years (55.0 ttl pk-yrs)    Types: Cigarettes    Start date: 04/10/1962    Quit date: 04/10/2017    Years since quitting: 6.0   Smokeless tobacco: Current    Types: Snuff  Vaping Use   Vaping status: Former  Substance and Sexual Activity   Alcohol use: No    Alcohol/week:  0.0 standard drinks of alcohol   Drug use: No   Sexual activity: Not on file  Other Topics Concern   Not on file  Social History Narrative   3 children initially- 2 sons locally, one daughter died at age 5 from tetrology of fallot.   [employer] [phone], domestic   Married 1970   Social Determinants of Health   Financial Resource Strain: Low Risk  (03/21/2023)   Overall Financial Resource Strain (CARDIA)    Difficulty of Paying Living Expenses: Not hard at all  Food Insecurity: No Food Insecurity (03/21/2023)   Hunger Vital Sign    Worried About Running Out of Food in the Last Year: Never true    Ran Out of Food in the Last Year: Never true  Transportation Needs: No Transportation Needs (03/21/2023)   PRAPARE - Administrator, Civil Service (Medical): No    Lack  of Transportation (Non-Medical): No  Physical Activity: Inactive (03/21/2023)   Exercise Vital Sign    Days of Exercise per Week: 0 days    Minutes of Exercise per Session: 0 min  Stress: No Stress Concern Present (03/21/2023)   [employer] of Occupational Health - Occupational Stress Questionnaire    Feeling of Stress : Not at all  Social Connections: Moderately Integrated (03/21/2023)   Social Connection and Isolation Panel [NHANES]    Frequency of Communication with Friends and Family: More than three times a week    Frequency of Social Gatherings with Friends and Family: Once a week    Attends Religious Services: More than 4 times per year    Active Member of [employer] or Organizations: No    Attends Banker Meetings: Never    Marital Status: Married  Catering manager Violence: Not At Risk (03/21/2023)   Humiliation, Afraid, Rape, and Kick questionnaire    Fear of Current or Ex-Partner: No    Emotionally Abused: No    Physically Abused: No    Sexually Abused: No     Family History  Problem Relation Age of Onset   Diabetes Mother    Heart disease Mother        MI   Alzheimer's disease  Mother        progressing   Cancer Father        possible colon   Diabetes Father    Heart disease Father        CHF   Stroke Father    Hypertension Father    Colon cancer Father        possible dx   Hypertension Sister    Colon polyps Sister    Cancer Sister        breast cancer   Cancer Brother        lung- smoker   Diabetes Brother    Colon polyps Brother    Hypertension Sister    Colon polyps Sister    Depression Neg Hx    Alcohol abuse Neg Hx    Drug abuse Neg Hx    Prostate cancer Neg Hx      Current Outpatient Medications:    Accu-Chek Softclix Lancets lancets, Use daily as needed to check sugar.  Dx E11.9., Disp: 100 each, Rfl: 11   albuterol (VENTOLIN HFA) 108 (90 Base) MCG/ACT inhaler, Inhale 1-2 puffs into the lungs every 6 (six) hours as needed for wheezing or shortness of breath., Disp: 8 g, Rfl: 2   amLODipine (NORVASC) 5 MG tablet, Take 1 tablet (5 mg total) by mouth daily., Disp: 90 tablet, Rfl: 3   APIXABAN (ELIQUIS) VTE STARTER PACK (10MG  AND 5MG ), Take as directed on package: start with two-5mg  tablets twice daily for 7 days. On day 8, switch to one-5mg  tablet twice daily., Disp: 74 each, Rfl: 0   blood glucose meter kit and supplies, Dispense based on patient and insurance preference. Use up to four times daily as directed. (FOR ICD-10 E10.9, E11.9)., Disp: 1 each, Rfl: 0   diclofenac Sodium (VOLTAREN) 1 % GEL, Apply 2 g topically 4 (four) times daily as needed., Disp: , Rfl:    glucose blood (ACCU-CHEK GUIDE) test strip, Use to check sugar daily as needed.  E11.9., Disp: 100 strip, Rfl: 11   neomycin-polymyxin b-dexamethasone (MAXITROL) 3.5-10000-0.1 OINT, , Disp: , Rfl:    pantoprazole (PROTONIX) 40 MG tablet, Take 1 tablet (40 mg total) by mouth daily., Disp: 90 tablet, Rfl:  3   pravastatin (PRAVACHOL) 10 MG tablet, TAKE 1 TABLET BY MOUTH EVERY DAY, Disp: 90 tablet, Rfl: 3   Physical exam: There were no vitals filed for this visit. Physical  Exam Cardiovascular:     Rate and Rhythm: Normal rate and regular rhythm.     Heart sounds: Normal heart sounds.  Pulmonary:     Effort: Pulmonary effort is normal.     Breath sounds: Normal breath sounds.  Abdominal:     General: Bowel sounds are normal.     Palpations: Abdomen is soft.  Musculoskeletal:     Comments: Left lower extremity more swollen than the right  Skin:    General: Skin is warm and dry.  Neurological:     Mental Status: He is alert and oriented to person, place, and time.           Latest Ref Rng & Units 03/21/2023    7:47 AM  CMP  Glucose 70 - 99 mg/dL 161   BUN 6 - 23 mg/dL 19   Creatinine 0.96 - 1.50 mg/dL 0.45   Sodium 409 - 811 mEq/L 142   Potassium 3.5 - 5.1 mEq/L 4.5   Chloride 96 - 112 mEq/L 106   CO2 19 - 32 mEq/L 26   Calcium 8.4 - 10.5 mg/dL 9.0   Total Protein 6.0 - 8.3 g/dL 6.7   Total Bilirubin 0.2 - 1.2 mg/dL 0.7   Alkaline Phos 39 - 117 U/L 43   AST 0 - 37 U/L 17   ALT 0 - 53 U/L 20       Latest Ref Rng & Units 03/21/2023    7:47 AM  CBC  WBC 4.0 - 10.5 K/uL 9.0   Hemoglobin 13.0 - 17.0 g/dL 91.4   Hematocrit 78.2 - 52.0 % 43.7   Platelets 150.0 - 400.0 K/uL 188.0     No images are attached to the encounter.  US Venous Img Lower Unilateral Left (DVT)  Result Date: 04/04/2023 CLINICAL DATA:  Sudden onset left calf swelling for 1 day EXAM: LEFT LOWER EXTREMITY VENOUS DOPPLER ULTRASOUND TECHNIQUE: Gray-scale sonography with graded compression, as well as color Doppler and duplex ultrasound were performed to evaluate the lower extremity deep venous systems from the level of the common femoral vein and including the common femoral, femoral, profunda femoral, popliteal and calf veins including the posterior tibial, peroneal and gastrocnemius veins when visible. The superficial great saphenous vein was also interrogated. Spectral Doppler was utilized to evaluate flow at rest and with distal augmentation maneuvers in the common  femoral, femoral and popliteal veins. COMPARISON:  None available FINDINGS: Contralateral Common Femoral Vein: Respiratory phasicity is normal and symmetric with the symptomatic side. No evidence of thrombus. Normal compressibility. Common Femoral Vein: Distal segment is noncompressible and filled with hypoechoic thrombus consistent with acute DVT. Saphenofemoral Junction: No evidence of thrombus. Normal compressibility and flow on color Doppler imaging. Profunda Femoral Vein: No evidence of thrombus. Normal compressibility and flow on color Doppler imaging. Femoral Vein: Nearly occlusive thrombus seen throughout the left femoral vein resulting in decreased flow and compressibility. Popliteal Vein: Nearly occlusive thrombus seen throughout the left popliteal vein resulting in decreased flow and compressibility. Calf Veins: Posterior tibial, peroneal, and gastrocnemius veins are thrombosed with decreased color flow and compressibility. Superficial Great Saphenous Vein: No evidence of thrombus. Normal compressibility. Other Findings:  None. IMPRESSION: Acute DVT of the left common femoral, femoral, popliteal, and calf veins. Electronically Signed   By: Mauri Reading  Mir  M.D.   On: 04/04/2023 15:57    Assessment and plan- Patient is a 72 y.o. male referred for left lower extremity DVT:  Discussed with the patient that he had extensive left lower extremity DVT involving the femoral popliteal and tibial veins on his left side.  Based on history this appears to be unprovoked.  He will remain on Eliquis at least for 6 months.  Discussed that Eliquis does not dissolve his existing DVT but prevents new DVT from forming.  CT chest did not show any evidence of lung cancer.  He has extensive family history of pancreatic as well as breast cancer.  Discussed that malignancy sometimes can be a cause of DVT but were unable to get his CT abdomen approved at this time based on about indications.  I will obtain hypercoagulable workup  at this time including protein C protein S, Antithrombin III levels, factor V Leiden and prothrombin gene testing as well as testing for antiphospholipid antibody syndrome.  I will see him back in 2 weeks time either in person or video visit to discuss the results of the blood work.  Sending him a refill of his Eliquis at this time as well.   Thank you for this kind referral and the opportunity to participate in the care of this patient   Visit Diagnosis 1. Acute deep vein thrombosis (DVT) of femoral vein of left lower extremity (HCC)     Dr. Owens Shark, MD, MPH [facility] at [facility] [phone] 04/11/2023

## 2023-04-12 LAB — PROTEIN S PANEL
Protein S Activity: 114 % (ref 63–140)
Protein S Ag, Free: 120 % (ref 61–136)
Protein S Ag, Total: 128 % (ref 60–150)

## 2023-04-12 LAB — DRVVT MIX: dRVVT Mix: 65.5 s — ABNORMAL HIGH (ref 0.0–40.4)

## 2023-04-12 LAB — DRVVT CONFIRM: dRVVT Confirm: 1.3 {ratio} — ABNORMAL HIGH (ref 0.8–1.2)

## 2023-04-12 LAB — LUPUS ANTICOAGULANT
DRVVT: 113.2 s — ABNORMAL HIGH (ref 0.0–47.0)
PTT Lupus Anticoagulant: 32.9 s (ref 0.0–43.5)
Thrombin Time: 17.7 s (ref 0.0–23.0)
dPT Confirm Ratio: 1.05 {ratio} (ref 0.00–1.34)
dPT: 53 s — ABNORMAL HIGH (ref 0.0–47.6)

## 2023-04-12 LAB — HEX PHASE PHOSPHOLIPID REFLEX

## 2023-04-12 LAB — CARDIOLIPIN ANTIBODIES, IGG, IGM, IGA
Anticardiolipin IgA: <9 [APL'U]/mL (ref 0–11)
Anticardiolipin IgG: <9 [GPL'U]/mL (ref 0–14)
Anticardiolipin IgM: <9 [MPL'U]/mL (ref 0–12)

## 2023-04-12 LAB — BETA-2-GLYCOPROTEIN I ABS, IGG/M/A
Beta-2 Glyco I IgG: <9 GPI IgG units (ref 0–20)
Beta-2-Glycoprotein I IgA: <9 GPI IgA units (ref 0–25)
Beta-2-Glycoprotein I IgM: <9 GPI IgM units (ref 0–32)

## 2023-04-12 LAB — HEXAGONAL PHASE PHOSPHOLIPID: Hex Phosph Neut Test: 3 s (ref 0–11)

## 2023-04-12 LAB — ANTITHROMBIN III: AntiThromb III Func: 108 % (ref 75–120)

## 2023-04-13 LAB — PROTEIN C, TOTAL: Protein C, Total: 96 % (ref 60–150)

## 2023-04-18 LAB — PROTHROMBIN GENE MUTATION

## 2023-04-18 LAB — FACTOR 5 LEIDEN

## 2023-04-22 ENCOUNTER — Telehealth: Payer: Self-pay | Admitting: Family Medicine

## 2023-04-22 NOTE — Telephone Encounter (Signed)
I wouldn't expect this med to cause the swelling.  If SOB, CP, more pain, then needs eval sooner.  Would keep elevated in the meantime.  Thanks.

## 2023-04-22 NOTE — Telephone Encounter (Signed)
Spoke with patient and his spouse to advise of Dr. Armanda Heritage note. They are aware and will call us if anything else is needed.

## 2023-04-22 NOTE — Telephone Encounter (Signed)
Sending note to Dr Para March and Para March pool per access note pt has appt with hepatologist on 04/25/23.

## 2023-04-22 NOTE — Telephone Encounter (Signed)
FYI: This call has been transferred to Access Nurse. Once the result note has been entered staff can address the message at that time.  Patient called in with the following symptoms:  Red Word: right leg/foot swollen, history of blood clots    Please advise at Mobile [phone] (mobile)  Message is routed to Provider Pool and [facility] Triage   Pt's wife, Aurea Graff, called stating the pt has been experiencing a swollen right leg/foot. Aurea Graff states the pt recently had a extreme case of blood clots in his right leg, per his hematologist. Aurea Graff states the pt is currently taking apixaban (ELIQUIS) 5 MG TABS tablet. No other symptoms. No available appts in our office. Transferred Aurea Graff & pt to access nurse. Sending note to [place] pool & triage pool

## 2023-04-29 ENCOUNTER — Inpatient Hospital Stay: Payer: Medicare Other | Admitting: Oncology

## 2023-04-29 DIAGNOSIS — Z7901 Long term (current) use of anticoagulants: Secondary | ICD-10-CM

## 2023-04-29 DIAGNOSIS — Z86718 Personal history of other venous thrombosis and embolism: Secondary | ICD-10-CM

## 2023-04-29 DIAGNOSIS — Z5181 Encounter for therapeutic drug level monitoring: Secondary | ICD-10-CM

## 2023-04-29 NOTE — Progress Notes (Signed)
I connected with Greggory Stallion on 04/29/23 at 10:30 AM EST by video enabled telemedicine visit and verified that I am speaking with the correct person using two identifiers.   I discussed the limitations, risks, security and privacy concerns of performing an evaluation and management service by telemedicine and the availability of in-person appointments. I also discussed with the patient that there may be a patient responsible charge related to this service. The patient expressed understanding and agreed to proceed.  Other persons participating in the visit and their role in the encounter: Patient's wife  Patient's location:  home Provider's location:  work  Diagnosis unprovoked left lower extremity DVT  Chief Complaint: Discuss hypercoagulable workup results  History of present illness: Patient is a 72 year old male with a past medical history significant for hypertension COPD GERD who underwent left lower extremity Doppler for left calf swelling on 04/04/2023.  Doppler showed acute DVT involving the left common femoral popliteal and calf veins.  Prior to that he had a CT chest lung cancer screening program which did not show any concerning findings for malignancy. Patient with started on Eliquis for his DVT and has been referred to [place] for further management.   He had sudden onset left calf swelling 7 days ago.  A day after patient saw his PCP and underwent Doppler which showed about DVT.  No prior history of DVT.  Family history significant for DVT in his brother who was also diagnosed with polycythemia vera.  Family history significant for pancreatic cancer in his uncle and breast cancer in his sister.  He denies any changes in his appetite and weight recently.  Denies any abdominal pain  Results of hypercoagulable workup including protein C, protein S, Antithrombin III levels normal.  Testing for factor V Leiden and prothrombin gene mutation negative.  Although patient had a positive DRVVT, hex  phase results were negative for lupus anticoagulant.  Beta-2 glycoprotein and cardiolipin antibodies negative.    Interval history no acute issues since last visit.  Tolerating Eliquis without any significant side effects   ROS  Allergies  Allergen Reactions   Azithromycin Rash   [name] Other (See Comments)    Edema at higher doses.  Not a true allergy o/w.     Tolmetin     Other reaction(s): Other (See Comments) Edema at higher doses.  Not a true allergy o/w.      Past Medical History:  Diagnosis Date   Amputation, thumb, traumatic 1968   tip amputated    Barrett's esophagus 05/08/02, 02/15/05, 2009   EGD- Barrett's esoph, HH, gastritis, diverticulitis   Basal cell cancer 05/2000   of nose, face (seen at [facility] 2015)   Diabetes mellitus without complication (HCC)    Diet controlled   Emphysema lung (HCC)    Esophageal reflux    History of colon polyps    Hypertension    controlled on meds   Lipoma of unspecified site    PONV (postoperative nausea and vomiting)    as a 72 yr old   Pure hypercholesterolemia    Tobacco use disorder    uses snuff   Unspecified gastritis and gastroduodenitis without mention of hemorrhage    Wears dentures    upper   Wrist fracture, left    as a child    Past Surgical History:  Procedure Laterality Date   BROW LIFT Right 01/30/2021   Procedure: BLEPHAROPLASTY UPPER EYELID; W/EXCESS SKIN BLEPHAROPTOSIS REPAIR; RESECT SKIN RIGHT;  Surgeon: Imagene Riches, MD;  Location: [facility];  Service: Ophthalmology;  Laterality: Right;   CATARACT EXTRACTION     2012   CHOLECYSTECTOMY, LAPAROSCOPIC  12/20/06   [place]   COLONOSCOPY N/A 06/09/2022   Procedure: COLONOSCOPY;  Surgeon: Toledo, Boykin Nearing, MD;  Location: [facility];  Service: Gastroenterology;  Laterality: N/A;   EYE SURGERY     trauma to right eye as a child, growth removed from eye 2 years later   Eyelid surgery Bilateral 06/2020   LAMINECTOMY  1984   L 4/5   UPPER GI  ENDOSCOPY  9/205    Social History   Socioeconomic History   Marital status: Married    Spouse name: Not on file   Number of children: 3   Years of education: Not on file   Highest education level: Some college, no degree  Occupational History   Occupation: retired Theatre stage manager, on disability for back pain  Tobacco Use   Smoking status: Former    Current packs/day: 0.00    Average packs/day: 1 pack/day for 55.0 years (55.0 ttl pk-yrs)    Types: Cigarettes    Start date: 04/10/1962    Quit date: 04/10/2017    Years since quitting: 6.0   Smokeless tobacco: Current    Types: Snuff  Vaping Use   Vaping status: Former  Substance and Sexual Activity   Alcohol use: No    Alcohol/week: 0.0 standard drinks of alcohol   Drug use: No   Sexual activity: Not Currently  Other Topics Concern   Not on file  Social History Narrative   3 children initially- 2 sons locally, one daughter died at age 16 from tetrology of fallot.   [employer] [phone], domestic   Married 1970   Social Drivers of Corporate investment banker Strain: Low Risk  (03/21/2023)   Overall Financial Resource Strain (CARDIA)    Difficulty of Paying Living Expenses: Not hard at all  Food Insecurity: No Food Insecurity (04/11/2023)   Hunger Vital Sign    Worried About Running Out of Food in the Last Year: Never true    Ran Out of Food in the Last Year: Never true  Transportation Needs: No Transportation Needs (04/11/2023)   PRAPARE - Administrator, Civil Service (Medical): No    Lack of Transportation (Non-Medical): No  Physical Activity: Inactive (03/21/2023)   Exercise Vital Sign    Days of Exercise per Week: 0 days    Minutes of Exercise per Session: 0 min  Stress: No Stress Concern Present (03/21/2023)   [employer] of Occupational Health - Occupational Stress Questionnaire    Feeling of Stress : Not at all  Social Connections: Moderately Integrated (03/21/2023)   Social Connection and Isolation  Panel [NHANES]    Frequency of Communication with Friends and Family: More than three times a week    Frequency of Social Gatherings with Friends and Family: Once a week    Attends Religious Services: More than 4 times per year    Active Member of [employer] or Organizations: No    Attends Banker Meetings: Never    Marital Status: Married  Catering manager Violence: Not At Risk (04/11/2023)   Humiliation, Afraid, Rape, and Kick questionnaire    Fear of Current or Ex-Partner: No    Emotionally Abused: No    Physically Abused: No    Sexually Abused: No    Family History  Problem Relation Age of Onset   Diabetes Mother    Heart  disease Mother        MI   Alzheimer's disease Mother        progressing   Cancer Father        possible colon   Diabetes Father    Heart disease Father        CHF   Stroke Father    Hypertension Father    Colon cancer Father        possible dx   Hypertension Sister    Colon polyps Sister    Cancer Sister        breast cancer   Cancer Brother        lung- smoker   Diabetes Brother    Colon polyps Brother    Hypertension Sister    Colon polyps Sister    Depression Neg Hx    Alcohol abuse Neg Hx    Drug abuse Neg Hx    Prostate cancer Neg Hx      Current Outpatient Medications:    Accu-Chek Softclix Lancets lancets, Use daily as needed to check sugar.  Dx E11.9., Disp: 100 each, Rfl: 11   albuterol (VENTOLIN HFA) 108 (90 Base) MCG/ACT inhaler, Inhale 1-2 puffs into the lungs every 6 (six) hours as needed for wheezing or shortness of breath., Disp: 8 g, Rfl: 2   amLODipine (NORVASC) 5 MG tablet, Take 1 tablet (5 mg total) by mouth daily., Disp: 90 tablet, Rfl: 3   apixaban (ELIQUIS) 5 MG TABS tablet, Take 1 tablet (5 mg total) by mouth 2 (two) times daily., Disp: 60 tablet, Rfl: 3   blood glucose meter kit and supplies, Dispense based on patient and insurance preference. Use up to four times daily as directed. (FOR ICD-10 E10.9, E11.9).,  Disp: 1 each, Rfl: 0   diclofenac Sodium (VOLTAREN) 1 % GEL, Apply 2 g topically 4 (four) times daily as needed., Disp: , Rfl:    glucose blood (ACCU-CHEK GUIDE) test strip, Use to check sugar daily as needed.  E11.9., Disp: 100 strip, Rfl: 11   neomycin-polymyxin b-dexamethasone (MAXITROL) 3.5-10000-0.1 OINT, , Disp: , Rfl:    pantoprazole (PROTONIX) 40 MG tablet, Take 1 tablet (40 mg total) by mouth daily., Disp: 90 tablet, Rfl: 3   pravastatin (PRAVACHOL) 10 MG tablet, TAKE 1 TABLET BY MOUTH EVERY DAY, Disp: 90 tablet, Rfl: 3  US Venous Img Lower Unilateral Left (DVT) Result Date: 04/04/2023 CLINICAL DATA:  Sudden onset left calf swelling for 1 day EXAM: LEFT LOWER EXTREMITY VENOUS DOPPLER ULTRASOUND TECHNIQUE: Gray-scale sonography with graded compression, as well as color Doppler and duplex ultrasound were performed to evaluate the lower extremity deep venous systems from the level of the common femoral vein and including the common femoral, femoral, profunda femoral, popliteal and calf veins including the posterior tibial, peroneal and gastrocnemius veins when visible. The superficial great saphenous vein was also interrogated. Spectral Doppler was utilized to evaluate flow at rest and with distal augmentation maneuvers in the common femoral, femoral and popliteal veins. COMPARISON:  None available FINDINGS: Contralateral Common Femoral Vein: Respiratory phasicity is normal and symmetric with the symptomatic side. No evidence of thrombus. Normal compressibility. Common Femoral Vein: Distal segment is noncompressible and filled with hypoechoic thrombus consistent with acute DVT. Saphenofemoral Junction: No evidence of thrombus. Normal compressibility and flow on color Doppler imaging. Profunda Femoral Vein: No evidence of thrombus. Normal compressibility and flow on color Doppler imaging. Femoral Vein: Nearly occlusive thrombus seen throughout the left femoral vein resulting in decreased flow and  compressibility. Popliteal Vein: Nearly occlusive thrombus seen throughout the left popliteal vein resulting in decreased flow and compressibility. Calf Veins: Posterior tibial, peroneal, and gastrocnemius veins are thrombosed with decreased color flow and compressibility. Superficial Great Saphenous Vein: No evidence of thrombus. Normal compressibility. Other Findings:  None. IMPRESSION: Acute DVT of the left common femoral, femoral, popliteal, and calf veins. Electronically Signed   By: Acquanetta Belling M.D.   On: 04/04/2023 15:57    No images are attached to the encounter.      Latest Ref Rng & Units 03/21/2023    7:47 AM  CMP  Glucose 70 - 99 mg/dL 841   BUN 6 - 23 mg/dL 19   Creatinine 3.24 - 1.50 mg/dL 4.01   Sodium 027 - 253 mEq/L 142   Potassium 3.5 - 5.1 mEq/L 4.5   Chloride 96 - 112 mEq/L 106   CO2 19 - 32 mEq/L 26   Calcium 8.4 - 10.5 mg/dL 9.0   Total Protein 6.0 - 8.3 g/dL 6.7   Total Bilirubin 0.2 - 1.2 mg/dL 0.7   Alkaline Phos 39 - 117 U/L 43   AST 0 - 37 U/L 17   ALT 0 - 53 U/L 20       Latest Ref Rng & Units 03/21/2023    7:47 AM  CBC  WBC 4.0 - 10.5 K/uL 9.0   Hemoglobin 13.0 - 17.0 g/dL 66.4   Hematocrit 40.3 - 52.0 % 43.7   Platelets 150.0 - 400.0 K/uL 188.0      Observation/objective: Appears in no acute distress over video visit today.  Breathing is nonlabored  Assessment and plan: Patient is a 72 year old male with history of left lower extremity DVT unprovoked currently on Eliquis.  This is a visit to discuss hypercoagulable workup and further management  Results of hypercoagulable workup including protein C protein S Antithrombin III, antiphospholipid antibody syndrome testing as well as factor V Leiden and prothrombin gene mutation negative.  His left lower extremity DVT appears unprovoked.  Patient's father also had PE and had been on lifelong Coumadin.  We discussed pros and cons of stopping Eliquis after 6 months.  Patient is in favor of continuing  Eliquis which I think would be reasonable given that he had an extensive unprovoked lower extremity DVT.  He can take 5 mg twice daily for 6 months up until June 2025 and following that it can be lowered to 2.5 mg twice daily.  Patient can continue to follow-up with his primary care doctor at this time and can be referred to me in the future if questions or concerns arise.  Patient can continue his day-to-day activities including walking up the stairs as tolerated.  If there are any new symptoms such as worsening leg swelling pain shortness of breath or pleuritic chest pain he will need to bring the symptoms to the attention of medical provider.  Consideration will need to be given to stop anticoagulation should he have any bleeding complications.  He can get his labs CBC and CMP checked on a yearly basis through Dr. Para March.  Follow-up instructions: As above  I discussed the assessment and treatment plan with the patient. The patient was provided an opportunity to ask questions and all were answered. The patient agreed with the plan and demonstrated an understanding of the instructions.   The patient was advised to call back or seek an in-person evaluation if the symptoms worsen or if the condition fails to improve as anticipated.  I  provided 11 minutes of non face-to-face telephone visit time during this encounter, and > 50% was spent counseling as documented under my assessment & plan.  Visit Diagnosis: 1. History of DVT (deep vein thrombosis)   2. Encounter for monitoring direct oral anticoagulant therapy     Dr. Owens Shark, MD, MPH [facility] at [facility] Tel- [phone] 04/29/2023 1:06 PM

## 2023-05-02 ENCOUNTER — Ambulatory Visit: Payer: Medicare Other | Admitting: Oncology

## 2023-05-03 ENCOUNTER — Other Ambulatory Visit: Payer: Self-pay | Admitting: Family Medicine

## 2023-05-04 ENCOUNTER — Other Ambulatory Visit: Payer: Self-pay | Admitting: Family Medicine

## 2023-05-09 ENCOUNTER — Other Ambulatory Visit: Payer: Self-pay | Admitting: Family Medicine

## 2023-05-09 NOTE — Telephone Encounter (Signed)
Copied from CRM [id number]. Topic: Clinical - Prescription Issue >> May 09, 2023  9:18 AM Brian Hull wrote: Reason for CRM: Most Recent Primary Care Visit:  Provider: Tillman Abide I  Department: [facility]  Visit Type: OFFICE VISIT  Date: 04/04/2023  Medication: pravastatin (PRAVACHOL) 10 MG tablet  Has the patient contacted their pharmacy? Yes (Agent: If no, request that the patient contact the pharmacy for the refill. If patient does not wish to contact the pharmacy document the reason why and proceed with request.) (Agent: If yes, when and what did the pharmacy advise?) Contact prescriber.  Is this the correct pharmacy for this prescription? Yes If no, delete pharmacy and type the correct one.  This is the patient's preferred pharmacy:  [id number] [place], [place] - [address] [place] [address] Phone: [phone] Fax: [phone]   Has the prescription been filled recently? Yes  Is the patient out of the medication? No   Has the patient been seen for an appointment in the last year OR does the patient have an upcoming appointment? Yes  Can we respond through MyChart? Yes  Agent: Please be advised that Rx refills may take up to 3 business days. We ask that you follow-up with your pharmacy.

## 2023-05-09 NOTE — Telephone Encounter (Signed)
Copied from CRM [id number]. Topic: Clinical - Medication Refill >> May 09, 2023  9:16 AM Isabell A wrote: Most Recent Primary Care Visit:  Provider: Tillman Abide I  Department: [facility]  Visit Type: OFFICE VISIT  Date: 04/04/2023  Medication: ***  Has the patient contacted their pharmacy?  (Agent: If no, request that the patient contact the pharmacy for the refill. If patient does not wish to contact the pharmacy document the reason why and proceed with request.) (Agent: If yes, when and what did the pharmacy advise?)  Is this the correct pharmacy for this prescription?  If no, delete pharmacy and type the correct one.  This is the patient's preferred pharmacy:  [id number] [place], [place] - [address] [place] [address] Phone: [phone] Fax: [phone]   Has the prescription been filled recently?   Is the patient out of the medication?   Has the patient been seen for an appointment in the last year OR does the patient have an upcoming appointment?   Can we respond through MyChart?   Agent: Please be advised that Rx refills may take up to 3 business days. We ask that you follow-up with your pharmacy.

## 2023-05-09 NOTE — Telephone Encounter (Signed)
Med refilled earlier today

## 2023-05-10 ENCOUNTER — Telehealth: Payer: Self-pay

## 2023-05-10 NOTE — Telephone Encounter (Addendum)
 Ok to take plain mucinex (guaifenesin) expectorant with plety of water to help mobilize mucous.  Could also take plain claritin  for runny nose or coricidin HBP brand of medicines which should be ok, just nothing with aspirin or ibuprofen.  If worsening symptoms, return of fever or over 101, or worsening shortness of breath, recommend urgent care evaluation.

## 2023-05-10 NOTE — Telephone Encounter (Signed)
 Called and spoke with patients wife symptoms began yesterday with runny nose, coughing up grey/green mucus, slight sore throat, today woke up with fever (100.3) has since resolved without medication, SOB on exertion, denies chest pains. Patients wife if wanting to know if patient can take mucinex or another OTC remedy to help symptoms while on Eliquis . Please advise.  PCP is out of office, sending to Dr. KANDICE.

## 2023-05-10 NOTE — Telephone Encounter (Signed)
 Copied from CRM [id number]. Topic: Clinical - Medical Advice >> May 10, 2023  8:33 AM Thersia BROCKS wrote: Reason for CRM: Patient wife called in wanting to know if there is something that he can take for his cold since he is on Eliquis . Patients wife wants to know if there is anything over the counter or if patient will need to come in to be seen. Patient wife is requesting a callback

## 2023-05-10 NOTE — Telephone Encounter (Signed)
Called wife on dpr. Reviewed all information and repeated back to me. Will call if any questions. Will to to urgent care/ed if any red words.

## 2023-05-30 ENCOUNTER — Telehealth: Payer: Self-pay

## 2023-05-30 DIAGNOSIS — I82412 Acute embolism and thrombosis of left femoral vein: Secondary | ICD-10-CM

## 2023-05-30 NOTE — Telephone Encounter (Signed)
I checked the hematology notes.  The plan per hematology is for him to take 5 mg twice daily for 6 months up until June 2025 and following that it can be lowered to 2.5 mg twice daily.   He should have refills for the 5mg  dosing at the pharmacy and should be able to continue with that.

## 2023-05-30 NOTE — Telephone Encounter (Signed)
Copied from CRM [id number]. Topic: Clinical - Medication Question >> May 30, 2023  2:18 PM Theodis Sato wrote: Reason for CRM: Patients wife is trying to be proactive with patients Eliquis prescription and wants to know if Dr. Para March will need to see  patient before filling this medication (in the next month) or if she can just call it in because it was patients hematologist who prescribed this medication. Patients wife is requesting a confirmation on this matter.

## 2023-05-31 NOTE — Telephone Encounter (Signed)
Called patients wife and reviewed all information. She verbalized understanding. Will call if any further questions.  

## 2023-08-01 DIAGNOSIS — L821 Other seborrheic keratosis: Secondary | ICD-10-CM | POA: Diagnosis not present

## 2023-08-01 DIAGNOSIS — D2261 Melanocytic nevi of right upper limb, including shoulder: Secondary | ICD-10-CM | POA: Diagnosis not present

## 2023-08-01 DIAGNOSIS — D2272 Melanocytic nevi of left lower limb, including hip: Secondary | ICD-10-CM | POA: Diagnosis not present

## 2023-08-01 DIAGNOSIS — C44319 Basal cell carcinoma of skin of other parts of face: Secondary | ICD-10-CM | POA: Diagnosis not present

## 2023-08-01 DIAGNOSIS — Z85828 Personal history of other malignant neoplasm of skin: Secondary | ICD-10-CM | POA: Diagnosis not present

## 2023-08-01 DIAGNOSIS — D225 Melanocytic nevi of trunk: Secondary | ICD-10-CM | POA: Diagnosis not present

## 2023-08-01 DIAGNOSIS — L570 Actinic keratosis: Secondary | ICD-10-CM | POA: Diagnosis not present

## 2023-08-01 DIAGNOSIS — D2262 Melanocytic nevi of left upper limb, including shoulder: Secondary | ICD-10-CM | POA: Diagnosis not present

## 2023-08-01 DIAGNOSIS — D485 Neoplasm of uncertain behavior of skin: Secondary | ICD-10-CM | POA: Diagnosis not present

## 2023-08-15 ENCOUNTER — Other Ambulatory Visit: Payer: Self-pay | Admitting: Family Medicine

## 2023-08-15 NOTE — Telephone Encounter (Signed)
 Copied from CRM [id number]. Topic: Clinical - Medication Question >> Aug 15, 2023 12:23 PM Nyra Capes wrote: Reason for CRM: Patient wife Aurea Graff calling in requesting medication refill.  Needs 5 MG for 1 more month. Patient wife would like to know if dosage will go down after the 1 month. Medication: apixaban (ELIQUIS) 5 MG TABS tablet

## 2023-08-17 MED ORDER — APIXABAN 2.5 MG PO TABS: 2.5000 mg | ORAL_TABLET | Freq: Two times a day (BID) | ORAL | 5 refills | Status: DC

## 2023-08-17 MED ORDER — APIXABAN 5 MG PO TABS: 5.0000 mg | ORAL_TABLET | Freq: Two times a day (BID) | ORAL | 1 refills | Status: DC

## 2023-08-17 NOTE — Telephone Encounter (Signed)
 Spoke with patients spouse Aurea Graff and advised of the rx and how it was sent. She verbalized understanding

## 2023-08-17 NOTE — Telephone Encounter (Signed)
 The notes from hematology state the plan to take 5 mg twice daily for 6 months up until June 2025 and following that it can be lowered to 2.5 mg twice daily.    I sent the 5mg  rx for now with a refill.   I sent another rx for 2.5mg  tabs to use after that rx is done, ie to start in June.  Thanks.

## 2023-09-20 DIAGNOSIS — C44622 Squamous cell carcinoma of skin of right upper limb, including shoulder: Secondary | ICD-10-CM | POA: Diagnosis not present

## 2023-09-20 DIAGNOSIS — D485 Neoplasm of uncertain behavior of skin: Secondary | ICD-10-CM | POA: Diagnosis not present

## 2023-09-20 DIAGNOSIS — C44319 Basal cell carcinoma of skin of other parts of face: Secondary | ICD-10-CM | POA: Diagnosis not present

## 2023-09-26 ENCOUNTER — Ambulatory Visit: Payer: Medicare Other | Admitting: Family Medicine

## 2023-09-30 ENCOUNTER — Ambulatory Visit (INDEPENDENT_AMBULATORY_CARE_PROVIDER_SITE_OTHER): Admitting: Family Medicine

## 2023-09-30 VITALS — BP 124/64 | HR 68 | Temp 98.0°F | Ht 71.0 in | Wt 203.0 lb

## 2023-09-30 DIAGNOSIS — E119 Type 2 diabetes mellitus without complications: Secondary | ICD-10-CM | POA: Diagnosis not present

## 2023-09-30 DIAGNOSIS — K635 Polyp of colon: Secondary | ICD-10-CM | POA: Insufficient documentation

## 2023-09-30 DIAGNOSIS — I82412 Acute embolism and thrombosis of left femoral vein: Secondary | ICD-10-CM

## 2023-09-30 DIAGNOSIS — K22719 Barrett's esophagus with dysplasia, unspecified: Secondary | ICD-10-CM

## 2023-09-30 DIAGNOSIS — Z86718 Personal history of other venous thrombosis and embolism: Secondary | ICD-10-CM | POA: Diagnosis not present

## 2023-09-30 LAB — POCT GLYCOSYLATED HEMOGLOBIN (HGB A1C): Hemoglobin A1C: 6.7 % — AB (ref 4.0–5.6)

## 2023-09-30 NOTE — Progress Notes (Unsigned)
 Diabetes:  No meds.   Hypoglycemic episodes: no Hyperglycemic episodes: no Feet problems: no Blood Sugars averaging: ~140s.   eye exam within last year: yes A1c 6.7.  Discussed with patient at office visit.  DVT d/w pt.   Patient is in favor of continuing Eliquis  which I think would be reasonable given that he had an extensive unprovoked lower extremity DVT. He can take 5 mg twice daily for 6 months up until June 2025 and following that it can be lowered to 2.5 mg twice daily.  He had 5mg  to use currently with 2.5mg  rx on file. He isn't bleeding.  He still has some swelling.  D/w pt about repeat u/s.    He was able to stop using voltaren  gel since his knee improved.  D/w pt about occ using voltaren  gel but not oral nsaids.    GERD sx improved after removal of gall bladder.  D/w pt about taking PPI given h/o Barrett's.  No dysphagia with foods or liquids.    Meds, vitals, and allergies reviewed.   ROS: Per HPI unless specifically indicated in ROS section   GEN: nad, alert and oriented HEENT: ncat NECK: supple w/o LA CV: rrr. PULM: ctab, no inc wob ABD: soft, +bs EXT: trace LLE edema SKIN: no acute rash  L calf 39cm circ R calf 37cm circ

## 2023-09-30 NOTE — Patient Instructions (Addendum)
 Recheck in about 6 months.  Yearly visit.  Labs ahead of time if possible.   You should get a call about the ultrasound.  Take care.  Glad to see you. Update me as needed.   I would make the change from 5 to 2.5mg  eliquis  as planned.

## 2023-10-03 ENCOUNTER — Other Ambulatory Visit: Payer: Self-pay | Admitting: Family Medicine

## 2023-10-03 DIAGNOSIS — Z86718 Personal history of other venous thrombosis and embolism: Secondary | ICD-10-CM

## 2023-10-03 NOTE — Assessment & Plan Note (Signed)
 A1c 6.7.   No change in meds.  Plan on recheck in about 6 months.  See after visit summary.  Continue work on diet.  Already on statin.

## 2023-10-03 NOTE — Assessment & Plan Note (Signed)
 GERD sx improved after removal of gall bladder.  D/w pt about taking PPI given h/o Barrett's.  No dysphagia with foods or liquids.  I would still continue PPI for now.

## 2023-10-03 NOTE — Assessment & Plan Note (Signed)
 I would make the change from 5 to 2.5mg  eliquis  as planned after 6 months of treatment.  Recheck ultrasound ordered.  Discussed that he still should be able to use Voltaren  gel episodically but I would avoid oral [name].

## 2023-10-07 ENCOUNTER — Ambulatory Visit
Admission: RE | Admit: 2023-10-07 | Discharge: 2023-10-07 | Disposition: A | Discharge status: Home / Self Care | Source: Ambulatory Visit | Attending: Family Medicine | Admitting: Family Medicine

## 2023-10-07 DIAGNOSIS — I82412 Acute embolism and thrombosis of left femoral vein: Secondary | ICD-10-CM | POA: Insufficient documentation

## 2023-10-07 DIAGNOSIS — I82512 Chronic embolism and thrombosis of left femoral vein: Secondary | ICD-10-CM | POA: Diagnosis not present

## 2023-10-07 DIAGNOSIS — I82532 Chronic embolism and thrombosis of left popliteal vein: Secondary | ICD-10-CM | POA: Diagnosis not present

## 2023-10-09 ENCOUNTER — Ambulatory Visit: Payer: Self-pay | Admitting: Family Medicine

## 2023-10-10 ENCOUNTER — Telehealth: Payer: Self-pay

## 2023-10-10 NOTE — Progress Notes (Signed)
 Complex Care Management Note Care Guide Note  10/10/2023 Name: Brian Hull. MRN: [id number] DOB: 01-14-1951   Complex Care Management Outreach Attempts: An unsuccessful telephone outreach was attempted today to offer the patient information about available complex care management services.  Follow Up Plan:  Additional outreach attempts will be made to offer the patient complex care management information and services.   Encounter Outcome:  No Answer  [facility]  [facility], [facility] Health Care Management Assistant Direct Dial: [phone]  Fax: [phone]

## 2023-10-10 NOTE — Progress Notes (Signed)
 Complex Care Management Note  Care Guide Note 10/10/2023 Name: Brian Hull. MRN: [id number] DOB: 22-Aug-1950  Brian Hull. is a 73 y.o. year old male who sees Donnie Galea, MD for primary care. I reached out to Brian B Stratton Jr. by phone today to offer complex care management services.  Brian Hull was given information about Complex Care Management services today including:   The Complex Care Management services include support from the care team which includes your Nurse Care Manager, Clinical Social Worker, or Pharmacist.  The Complex Care Management team is here to help remove barriers to the health concerns and goals most important to you. Complex Care Management services are voluntary, and the patient may decline or stop services at any time by request to their care team member.   Complex Care Management Consent Status: Patient agreed to services and verbal consent obtained.   Follow up plan:  Telephone appointment with complex care management team member scheduled for:  10/13/23 at 10:00 a.m.   Encounter Outcome:  Patient Scheduled  [facility]  [facility], [facility] Health Care Management Assistant Direct Dial: [phone]  Fax: [phone]

## 2023-10-13 ENCOUNTER — Other Ambulatory Visit (INDEPENDENT_AMBULATORY_CARE_PROVIDER_SITE_OTHER): Admitting: Pharmacist

## 2023-10-13 DIAGNOSIS — Z86718 Personal history of other venous thrombosis and embolism: Secondary | ICD-10-CM

## 2023-10-13 NOTE — Patient Instructions (Addendum)
 [name].,   It was a pleasure to speak with you today! As we discussed:?   [employer]HS provides access to a Medicare specialist for all Medicare patients Engineer, production Information Program [facility]) Coordinator). This is a FREE and non-biased service. I recommend speaking with them with a list of your medications as they can provide some insight into different Medicare plans based on your needs.  [place] [email or web address]  Presenter, broadcasting Information Program [facility]) Coordinator Idella Major [facility] [address]     [place]  [phone] (Ask for [facility] help)   Please reach out prior to your next scheduled appointment should you have any questions or concerns.  You may respond directly to this message, or leave me a voicemail at [phone] and I will get back to you shortly.   Thank you!   Brian Hull, PharmD Clinical Pharmacist [facility] [phone]

## 2023-10-13 NOTE — Progress Notes (Signed)
   10/13/2023 Name: Brian B Suchan Jr. MRN: [id number] DOB: 10-06-1950  Subjective  Chief Complaint  Patient presents with   Medication Access    Care Team: Primary Care Provider: Donnie Galea, MD  Reason for visit: ?  Brian Hull. is a 73 y.o. male who presents today for a telephone visit with the pharmacist due to discuss medication access.   Medication Access: ?  Reports that all medications have historically been affordable though they have a large Rx deductible.  Last year on Eliquis , paid their deductible then the monthly copay decreased thereafter.   This year, they paid their deductible though their monthly copay is higher at $118.66. They have been able to afford this cost, though are curious of any ways to get the medicine for cheaper.   Prescription drug coverage: [employer] Medicare ([employer] Direct) Medicare PDP  Patient reports last year Medicare made him choose an Rx plan as to avoid a penalty. At the time was not taking medications and opted for the cheapest plan.  Is interested in possibly looking for different plan for next year.   Summary of Benefit: Rx Deductible $590 Tier 3 (preferred brand): Patient responsible for 21% of drug cost.  Current Patient Assistance: N/A  Patient lives in a household of 2 with income consisting of SSI retirement and pension.  Medicare LIS Eligible: No  Couples Income Limit $2485/month  Assessment and Plan:   1. Medication Access Eliquis  cost is feasible at this time though is expensive ~$118/month. Unfortunately, there are not great options available at this time to lower the cost further. Reviewed options with patient/wife below. They deny concerns with continuing therapy at current cost. However, we reviewed Medicare resources as to assist patient/wife with Medicare Rx plan selection for upcoming open enrollment period as to avoid similar situation next year.  Annual gross income is just below 400% FPL.  Unfortunately, Eliquis  and Xarelto PAP programs have a cutoff of 300% FPL. Regardless, the 3-4% OOP spend requirement would not be feasible as this amount would exceed the max OOP Medicare spend limit of $2000.  No grants available at this time for anticoagulation assistance.  Not eligible for copay card due to government insurance   [place] [employer] Medical laboratory scientific officer [employer] Information Program [facility]) Coordinator Idella Major [facility] [address]     [place]  [phone] (Ask for [facility] help)   Future Appointments  Date Time Provider Department Center  12/01/2023 10:00 AM [facility]CT [facility]  03/22/2024  8:00 AM [facility] LAB [facility] PEC  03/22/2024  3:40 PM [facility] ANNUAL WELLNESS VISIT 1 [facility] PEC  03/29/2024 11:30 AM Donnie Galea, MD [facility] PEC    Daron Ellen, PharmD Clinical Pharmacist [facility] [phone]

## 2023-11-01 DIAGNOSIS — C44622 Squamous cell carcinoma of skin of right upper limb, including shoulder: Secondary | ICD-10-CM | POA: Diagnosis not present

## 2023-11-01 DIAGNOSIS — D2361 Other benign neoplasm of skin of right upper limb, including shoulder: Secondary | ICD-10-CM | POA: Diagnosis not present

## 2023-12-01 ENCOUNTER — Ambulatory Visit
Admission: RE | Admit: 2023-12-01 | Discharge: 2023-12-01 | Disposition: A | Discharge status: Home / Self Care | Source: Ambulatory Visit | Attending: Acute Care | Admitting: Acute Care

## 2023-12-01 DIAGNOSIS — Z122 Encounter for screening for malignant neoplasm of respiratory organs: Secondary | ICD-10-CM | POA: Diagnosis not present

## 2023-12-01 DIAGNOSIS — Z87891 Personal history of nicotine dependence: Secondary | ICD-10-CM | POA: Insufficient documentation

## 2023-12-09 ENCOUNTER — Other Ambulatory Visit: Payer: Self-pay

## 2023-12-09 DIAGNOSIS — Z122 Encounter for screening for malignant neoplasm of respiratory organs: Secondary | ICD-10-CM

## 2023-12-09 DIAGNOSIS — Z87891 Personal history of nicotine dependence: Secondary | ICD-10-CM

## 2024-01-27 ENCOUNTER — Other Ambulatory Visit: Payer: Self-pay | Admitting: Family Medicine

## 2024-02-26 ENCOUNTER — Inpatient Hospital Stay: Admission: RE | Admit: 2024-02-26 | Discharge: 2024-02-26 | Payer: Self-pay | Attending: Emergency Medicine

## 2024-02-26 VITALS — BP 135/77 | HR 73 | Temp 98.3°F | Resp 18

## 2024-02-26 DIAGNOSIS — R300 Dysuria: Secondary | ICD-10-CM | POA: Insufficient documentation

## 2024-02-26 LAB — POCT URINE DIPSTICK
Bilirubin, UA: NEGATIVE
Blood, UA: NEGATIVE
Glucose, UA: NEGATIVE mg/dL
Ketones, POC UA: NEGATIVE mg/dL
Nitrite, UA: NEGATIVE
Spec Grav, UA: 1.02 (ref 1.010–1.025)
Urobilinogen, UA: 2 U/dL — AB
pH, UA: 7 (ref 5.0–8.0)

## 2024-02-26 MED ORDER — CEPHALEXIN 500 MG PO CAPS: 500.0000 mg | ORAL_CAPSULE | Freq: Three times a day (TID) | ORAL | 0 refills | Status: AC

## 2024-02-26 NOTE — ED Triage Notes (Signed)
 Patient to Urgent Care with complaints of urinary frequency/ dysuria/ fevers.  Symptoms x3 days. Pushing fluids.   Reports he used a home UTI test that was positive for nitrite and leukocytes.

## 2024-02-26 NOTE — Discharge Instructions (Addendum)
Take the antibiotic as directed.  The urine culture is pending.  We will call you if it shows the need to change or discontinue your antibiotic.    Follow up with your primary care provider.    

## 2024-02-26 NOTE — ED Provider Notes (Signed)
 Brian Hull    CSN: [id number] Arrival date & time: 02/26/24  1238      History   Chief Complaint Chief Complaint  Patient presents with   Urinary Frequency    Urinary Tract Infection Test Strip from [facility] tested positive for Nitrite and Leukocytes.  Has had fever. - Entered by patient    HPI Brian Hull. is a 73 y.o. male.  Accompanied by his wife, patient presents with 3 to 4-day history of dysuria and urinary frequency.  He reports fever of 101 on the first day of his symptoms; his temperature was 99 last night and is normal this morning.  He has been treating his dysuria with increased water intake.  He took a home UTI test which was positive for leukocytes and nitrite.  He denies history of UTI or BPH.  He reports normal urine stream.  No abdominal pain, hematuria, flank pain.  The history is provided by the patient, the spouse and medical records.    Past Medical History:  Diagnosis Date   Amputation, thumb, traumatic 1968   tip amputated    Barrett's esophagus 05/08/02, 02/15/05, 2009   EGD- Barrett's esoph, HH, gastritis, diverticulitis   Basal cell cancer 05/2000   of nose, face (seen at [facility] 2015)   Diabetes mellitus without complication (HCC)    Diet controlled   Emphysema lung (HCC)    Esophageal reflux    History of colon polyps    Hypertension    controlled on meds   Lipoma of unspecified site    PONV (postoperative nausea and vomiting)    as a 73 yr old   Pure hypercholesterolemia    Tobacco use disorder    uses snuff   Unspecified gastritis and gastroduodenitis without mention of hemorrhage    Wears dentures    upper   Wrist fracture, left    as a child    Patient Active Problem List   Diagnosis Date Noted   Colon polyp 09/30/2023   Calf swelling 04/04/2023   History of DVT (deep vein thrombosis) 04/04/2023   Stye 09/26/2022   Right knee pain 09/26/2022   SOBOE (shortness of breath on exertion) 10/08/2020   Hiatal hernia  10/07/2020   Hypertension 10/07/2020   Rhinitis 06/22/2020   Vision changes 09/17/2018   Health care maintenance 03/12/2018   Back pain 03/12/2018   Coronary artery disease involving native heart without angina pectoris 10/19/2017   Edema 11/01/2014   Advance care planning 03/01/2014   Facial pain 02/27/2013   Basal cell carcinoma of skin of unspecified parts of face 11/01/2012   Diabetes mellitus without complication (HCC) 02/14/2011   Medicare annual wellness visit, subsequent 02/14/2011   Barrett's esophagus 12/13/2010   COPD (chronic obstructive pulmonary disease) (HCC) 10/23/2010   FATIGUE 01/22/2010   NICOTINE ADDICTION 01/16/2007   History of colonic polyps 01/16/2007   GERD, SEVERE 11/08/2006    Past Surgical History:  Procedure Laterality Date   BROW LIFT Right 01/30/2021   Procedure: BLEPHAROPLASTY UPPER EYELID; W/EXCESS SKIN BLEPHAROPTOSIS REPAIR; RESECT SKIN RIGHT;  Surgeon: Ashley Greig HERO, MD;  [facility];  Service: Ophthalmology;  Laterality: Right;   CATARACT EXTRACTION     2012   CHOLECYSTECTOMY, LAPAROSCOPIC  12/20/06   [place]   COLONOSCOPY N/A 06/09/2022   Procedure: COLONOSCOPY;  Surgeon: Toledo, Ladell POUR, MD;  Location: [facility] ENDOSCOPY;  Service: Gastroenterology;  Laterality: N/A;   EYE SURGERY     trauma to right eye  as a child, growth removed from eye 2 years later   Eyelid surgery Bilateral 06/2020   LAMINECTOMY  1984   L 4/5   UPPER GI ENDOSCOPY  9/205       Home Medications    Prior to Admission medications   Medication Sig Start Date End Date Taking? Authorizing Provider  cephALEXin (KEFLEX) 500 MG capsule Take 1 capsule (500 mg total) by mouth 3 (three) times daily for 5 days. 02/26/24 03/02/24 Yes Corlis Burnard DEL, NP  Accu-Chek Softclix Lancets lancets Use daily as needed to check sugar.  Dx E11.9. 09/23/22   Cleatus Arlyss RAMAN, MD  albuterol  (VENTOLIN  HFA) 108 [phone] Base) MCG/ACT inhaler Inhale 1-2 puffs into the lungs every 6  (six) hours as needed for wheezing or shortness of breath. 03/28/23   Cleatus Arlyss RAMAN, MD  amLODipine  (NORVASC ) 5 MG tablet TAKE 1 TABLET (5 MG TOTAL) BY MOUTH DAILY. 01/27/24   Cleatus Arlyss RAMAN, MD  apixaban  (ELIQUIS ) 2.5 MG TABS tablet Take 1 tablet (2.5 mg total) by mouth 2 (two) times daily. Start in June 2025 after completion of 5mg  dosing 08/17/23   Cleatus Arlyss RAMAN, MD  apixaban  (ELIQUIS ) 5 MG TABS tablet Take 1 tablet (5 mg total) by mouth 2 (two) times daily. Patient not taking: Reported on 02/26/2024 08/17/23   Cleatus Arlyss RAMAN, MD  blood glucose meter kit and supplies Dispense based on patient and insurance preference. Use up to four times daily as directed. (FOR ICD-10 E10.9, E11.9). 03/31/20   Cleatus Arlyss RAMAN, MD  diclofenac  Sodium (VOLTAREN ) 1 % GEL Apply 2 g topically 4 (four) times daily as needed. Patient not taking: Reported on 09/30/2023 09/23/22   Cleatus Arlyss RAMAN, MD  glucose blood (ACCU-CHEK GUIDE) test strip Use to check sugar daily as needed.  E11.9. 09/23/22   Cleatus Arlyss RAMAN, MD  neomycin-polymyxin b-dexamethasone  (MAXITROL) 3.5-10000-0.1 OINT  07/25/22   [provider]  pantoprazole  (PROTONIX ) 40 MG tablet TAKE 1 TABLET BY MOUTH DAILY 05/03/23   Cleatus Arlyss RAMAN, MD  pravastatin  (PRAVACHOL ) 10 MG tablet TAKE 1 TABLET BY MOUTH DAILY 05/09/23   Cleatus Arlyss RAMAN, MD    Family History Family History  Problem Relation Age of Onset   Diabetes Mother    Heart disease Mother        MI   Alzheimer's disease Mother        progressing   Cancer Father        possible colon   Diabetes Father    Heart disease Father        CHF   Stroke Father    Hypertension Father    Colon cancer Father        possible dx   Hypertension Sister    Colon polyps Sister    Cancer Sister        breast cancer   Cancer Brother        lung- smoker   Diabetes Brother    Colon polyps Brother    Hypertension Sister    Colon polyps Sister    Depression Neg Hx    Alcohol abuse Neg Hx     Drug abuse Neg Hx    Prostate cancer Neg Hx     Social History Social History   Tobacco Use   Smoking status: Former    Current packs/day: 0.00    Average packs/day: 1 pack/day for 55.0 years (55.0 ttl pk-yrs)    Types: Cigarettes    Start date:  04/10/1962    Quit date: 04/10/2017    Years since quitting: 6.8   Smokeless tobacco: Current    Types: Snuff  Vaping Use   Vaping status: Former  Substance Use Topics   Alcohol use: No    Alcohol/week: 0.0 standard drinks of alcohol   Drug use: No     Allergies   Azithromycin, [name], [name]   Review of Systems Review of Systems  Constitutional:  Positive for fever. Negative for chills.  Gastrointestinal:  Negative for abdominal pain.  Genitourinary:  Positive for dysuria and frequency. Negative for flank pain and hematuria.     Physical Exam Triage Vital Signs ED Triage Vitals  Encounter Vitals Group     BP      Girls Systolic BP Percentile      Girls Diastolic BP Percentile      Boys Systolic BP Percentile      Boys Diastolic BP Percentile      Pulse      Resp      Temp      Temp src      SpO2      Weight      Height      Head Circumference      Peak Flow      Pain Score      Pain Loc      Pain Education      Exclude from Growth Chart    No data found.  Updated Vital Signs BP 135/77   Pulse 73   Temp 98.3 F (36.8 C)   Resp 18   SpO2 97%   Visual Acuity Right Eye Distance:   Left Eye Distance:   Bilateral Distance:    Right Eye Near:   Left Eye Near:    Bilateral Near:     Physical Exam Constitutional:      General: He is not in acute distress. HENT:     Mouth/Throat:     Mouth: Mucous membranes are moist.  Cardiovascular:     Rate and Rhythm: Normal rate and regular rhythm.     Heart sounds: Normal heart sounds.  Pulmonary:     Effort: Pulmonary effort is normal. No respiratory distress.     Breath sounds: Normal breath sounds.  Abdominal:     General: Bowel sounds are  normal.     Palpations: Abdomen is soft.     Tenderness: There is no abdominal tenderness. There is no right CVA tenderness, left CVA tenderness, guarding or rebound.  Neurological:     Mental Status: He is alert.      UC Treatments / Results  Labs (all labs ordered are listed, but only abnormal results are displayed) Labs Reviewed  POCT URINE DIPSTICK - Abnormal; Notable for the following components:      Result Value   Urobilinogen, UA 2.0 (*)    Leukocytes, UA Small (1+) (*)    All other components within normal limits  URINE CULTURE    EKG   Radiology No results found.  Procedures Procedures (including critical care time)  Medications Ordered in UC Medications - No data to display  Initial Impression / Assessment and Plan / UC Course  I have reviewed the triage vital signs and the nursing notes.  Pertinent labs & imaging results that were available during my care of the patient were reviewed by me and considered in my medical decision making (see chart for details).    Dysuria.  Afebrile and vital  signs are stable.  Patient does not have history of UTI or BPH.  His urine stream is normal.  Treating today with cephalexin.  Urine culture pending.  Discussed with patient that we will call him if the culture shows the need to change or discontinue the antibiotic.  Instructed him to follow-up with his PCP.  Education provided on dysuria.  ED precautions given.  He agrees to plan of care.  Final Clinical Impressions(s) / UC Diagnoses   Final diagnoses:  Dysuria     Discharge Instructions      Take the antibiotic as directed.  The urine culture is pending.  We will call you if it shows the need to change or discontinue your antibiotic.    Follow up with your primary care provider.        ED Prescriptions     Medication Sig Dispense Auth. Provider   cephALEXin (KEFLEX) 500 MG capsule Take 1 capsule (500 mg total) by mouth 3 (three) times daily for 5 days. 15  capsule Corlis Burnard DEL, NP      PDMP not reviewed this encounter.   Corlis Burnard DEL, NP 02/26/24 1341

## 2024-02-28 ENCOUNTER — Ambulatory Visit (HOSPITAL_COMMUNITY): Payer: Self-pay

## 2024-02-28 LAB — URINE CULTURE: Culture: 80000 — AB

## 2024-02-28 MED ORDER — SULFAMETHOXAZOLE-TRIMETHOPRIM 800-160 MG PO TABS: 2.0000 | ORAL_TABLET | Freq: Two times a day (BID) | ORAL | 0 refills | Status: AC

## 2024-02-28 NOTE — Telephone Encounter (Signed)
 Discontinue Keflex.  Begin Bactrim double strength 2 tablets twice daily (patient weighs more than 70 kg) for 7 days for treatment of acute urinary tract infection in male patient with Klebsiella aerogenes, a difficult to treat bacteria that has been rapidly developing multidrug resistance.  Prescription sent to pharmacy.  Thank you.

## 2024-03-01 DIAGNOSIS — E119 Type 2 diabetes mellitus without complications: Secondary | ICD-10-CM | POA: Diagnosis not present

## 2024-03-06 DIAGNOSIS — C44612 Basal cell carcinoma of skin of right upper limb, including shoulder: Secondary | ICD-10-CM | POA: Diagnosis not present

## 2024-03-06 DIAGNOSIS — L570 Actinic keratosis: Secondary | ICD-10-CM | POA: Diagnosis not present

## 2024-03-06 DIAGNOSIS — D2271 Melanocytic nevi of right lower limb, including hip: Secondary | ICD-10-CM | POA: Diagnosis not present

## 2024-03-06 DIAGNOSIS — C44329 Squamous cell carcinoma of skin of other parts of face: Secondary | ICD-10-CM | POA: Diagnosis not present

## 2024-03-06 DIAGNOSIS — C44519 Basal cell carcinoma of skin of other part of trunk: Secondary | ICD-10-CM | POA: Diagnosis not present

## 2024-03-06 DIAGNOSIS — D485 Neoplasm of uncertain behavior of skin: Secondary | ICD-10-CM | POA: Diagnosis not present

## 2024-03-06 DIAGNOSIS — D2272 Melanocytic nevi of left lower limb, including hip: Secondary | ICD-10-CM | POA: Diagnosis not present

## 2024-03-06 DIAGNOSIS — D2261 Melanocytic nevi of right upper limb, including shoulder: Secondary | ICD-10-CM | POA: Diagnosis not present

## 2024-03-06 DIAGNOSIS — Z85828 Personal history of other malignant neoplasm of skin: Secondary | ICD-10-CM | POA: Diagnosis not present

## 2024-03-06 DIAGNOSIS — D2262 Melanocytic nevi of left upper limb, including shoulder: Secondary | ICD-10-CM | POA: Diagnosis not present

## 2024-03-12 DIAGNOSIS — H0012 Chalazion right lower eyelid: Secondary | ICD-10-CM | POA: Diagnosis not present

## 2024-03-12 DIAGNOSIS — H02132 Senile ectropion of right lower eyelid: Secondary | ICD-10-CM | POA: Diagnosis not present

## 2024-03-13 ENCOUNTER — Telehealth: Payer: Self-pay

## 2024-03-13 NOTE — Telephone Encounter (Signed)
 He could hold eliquis  for 3 days prior.  Thanks.

## 2024-03-13 NOTE — Telephone Encounter (Signed)
 Copied from CRM #[id number]. Topic: Clinical - Medical Advice >> Mar 13, 2024  4:12 PM Thersia BROCKS wrote: Reason for CRM: Patient has to get minor procedure at [facility] center, has appointment on Friday needs to call Dr.Duncan needs to know how many days does he needs to stop apixaban  (ELIQUIS ) 2.5 MG TABS tablet before the procedure would like a callback regarding this .

## 2024-03-14 NOTE — Telephone Encounter (Signed)
 Spoke w/ Pt's wife- informed to hold Eliquis  3 days prior to procedure.

## 2024-03-16 DIAGNOSIS — H04521 Eversion of right lacrimal punctum: Secondary | ICD-10-CM | POA: Diagnosis not present

## 2024-03-16 DIAGNOSIS — H0012 Chalazion right lower eyelid: Secondary | ICD-10-CM | POA: Diagnosis not present

## 2024-03-16 DIAGNOSIS — H02112 Cicatricial ectropion of right lower eyelid: Secondary | ICD-10-CM | POA: Diagnosis not present

## 2024-03-21 ENCOUNTER — Other Ambulatory Visit: Payer: Self-pay | Admitting: Family Medicine

## 2024-03-21 DIAGNOSIS — E119 Type 2 diabetes mellitus without complications: Secondary | ICD-10-CM

## 2024-03-21 DIAGNOSIS — Z125 Encounter for screening for malignant neoplasm of prostate: Secondary | ICD-10-CM

## 2024-03-22 ENCOUNTER — Other Ambulatory Visit: Payer: Medicare Other

## 2024-03-22 DIAGNOSIS — Z125 Encounter for screening for malignant neoplasm of prostate: Secondary | ICD-10-CM

## 2024-03-22 DIAGNOSIS — E119 Type 2 diabetes mellitus without complications: Secondary | ICD-10-CM | POA: Diagnosis not present

## 2024-03-22 LAB — CBC WITH DIFFERENTIAL/PLATELET
Basophils Absolute: 0.1 K/uL (ref 0.0–0.1)
Basophils Relative: 1.2 % (ref 0.0–3.0)
Eosinophils Absolute: 0.3 K/uL (ref 0.0–0.7)
Eosinophils Relative: 4.1 % (ref 0.0–5.0)
HCT: 41.3 % (ref 39.0–52.0)
Hemoglobin: 13.7 g/dL (ref 13.0–17.0)
Lymphocytes Relative: 31 % (ref 12.0–46.0)
Lymphs Abs: 2.3 K/uL (ref 0.7–4.0)
MCHC: 33.1 g/dL (ref 30.0–36.0)
MCV: 92.7 fl (ref 78.0–100.0)
Monocytes Absolute: 0.7 K/uL (ref 0.1–1.0)
Monocytes Relative: 9.3 % (ref 3.0–12.0)
Neutro Abs: 4.1 K/uL (ref 1.4–7.7)
Neutrophils Relative %: 54.4 % (ref 43.0–77.0)
Platelets: 173 K/uL (ref 150.0–400.0)
RBC: 4.46 Mil/uL (ref 4.22–5.81)
RDW: 13.3 % (ref 11.5–15.5)
WBC: 7.5 K/uL (ref 4.0–10.5)

## 2024-03-22 LAB — COMPREHENSIVE METABOLIC PANEL WITH GFR
ALT: 22 U/L (ref 0–53)
AST: 16 U/L (ref 0–37)
Albumin: 4 g/dL (ref 3.5–5.2)
Alkaline Phosphatase: 34 U/L — ABNORMAL LOW (ref 39–117)
BUN: 23 mg/dL (ref 6–23)
CO2: 29 meq/L (ref 19–32)
Calcium: 8.7 mg/dL (ref 8.4–10.5)
Chloride: 104 meq/L (ref 96–112)
Creatinine, Ser: 1.34 mg/dL (ref 0.40–1.50)
GFR: 52.05 mL/min — ABNORMAL LOW (ref >60.00)
Glucose, Bld: 139 mg/dL — ABNORMAL HIGH (ref 70–99)
Potassium: 4.2 meq/L (ref 3.5–5.1)
Sodium: 141 meq/L (ref 135–145)
Total Bilirubin: 0.4 mg/dL (ref 0.2–1.2)
Total Protein: 6.4 g/dL (ref 6.0–8.3)

## 2024-03-22 LAB — LIPID PANEL
Cholesterol: 127 mg/dL (ref 0–200)
HDL: 45.2 mg/dL (ref >39.00)
LDL Cholesterol: 67 mg/dL (ref 0–99)
NonHDL: 81.63
Total CHOL/HDL Ratio: 3
Triglycerides: 73 mg/dL (ref 0.0–149.0)
VLDL: 14.6 mg/dL (ref 0.0–40.0)

## 2024-03-22 LAB — MICROALBUMIN / CREATININE URINE RATIO
Creatinine,U: 219.5 mg/dL
Microalb Creat Ratio: UNDETERMINED mg/g (ref 0.0–30.0)
Microalb, Ur: <0.7 mg/dL

## 2024-03-22 LAB — HEMOGLOBIN A1C: Hgb A1c MFr Bld: 6.9 % — ABNORMAL HIGH (ref 4.6–6.5)

## 2024-03-22 LAB — PSA, MEDICARE: PSA: 3.25 ng/mL (ref 0.10–4.00)

## 2024-03-25 ENCOUNTER — Ambulatory Visit: Payer: Self-pay | Admitting: Family Medicine

## 2024-03-29 ENCOUNTER — Other Ambulatory Visit: Payer: Self-pay | Admitting: Family Medicine

## 2024-03-29 ENCOUNTER — Ambulatory Visit: Payer: Medicare Other | Admitting: Family Medicine

## 2024-03-29 ENCOUNTER — Encounter: Payer: Self-pay | Admitting: Family Medicine

## 2024-03-29 VITALS — BP 136/76 | HR 64 | Temp 97.9°F | Ht 70.75 in | Wt 201.4 lb

## 2024-03-29 DIAGNOSIS — Z7189 Other specified counseling: Secondary | ICD-10-CM

## 2024-03-29 DIAGNOSIS — K22719 Barrett's esophagus with dysplasia, unspecified: Secondary | ICD-10-CM

## 2024-03-29 DIAGNOSIS — E119 Type 2 diabetes mellitus without complications: Secondary | ICD-10-CM

## 2024-03-29 DIAGNOSIS — I10 Essential (primary) hypertension: Secondary | ICD-10-CM

## 2024-03-29 DIAGNOSIS — Z86718 Personal history of other venous thrombosis and embolism: Secondary | ICD-10-CM | POA: Diagnosis not present

## 2024-03-29 DIAGNOSIS — R519 Headache, unspecified: Secondary | ICD-10-CM

## 2024-03-29 DIAGNOSIS — Z0000 Encounter for general adult medical examination without abnormal findings: Secondary | ICD-10-CM | POA: Diagnosis not present

## 2024-03-29 DIAGNOSIS — Z23 Encounter for immunization: Secondary | ICD-10-CM | POA: Diagnosis not present

## 2024-03-29 LAB — C-REACTIVE PROTEIN: CRP: <0.5 mg/dL (ref 0.5–20.0)

## 2024-03-29 LAB — SEDIMENTATION RATE: Sed Rate: 12 mm/h (ref 0–20)

## 2024-03-29 MED ORDER — PANTOPRAZOLE SODIUM 40 MG PO TBEC: 40.0000 mg | DELAYED_RELEASE_TABLET | Freq: Every day | ORAL | 3 refills | Status: AC

## 2024-03-29 MED ORDER — PRAVASTATIN SODIUM 10 MG PO TABS: 10.0000 mg | ORAL_TABLET | Freq: Every day | ORAL | 3 refills | Status: AC

## 2024-03-29 MED ORDER — CVS GLUCOSE METER TEST STRIPS VI STRP: ORAL_STRIP | 12 refills | Status: DC

## 2024-03-29 MED ORDER — APIXABAN 2.5 MG PO TABS: 2.5000 mg | ORAL_TABLET | Freq: Two times a day (BID) | ORAL | 3 refills | Status: AC

## 2024-03-29 NOTE — Patient Instructions (Addendum)
 Go to the lab on the way out.   If you have mychart we'll likely use that to update you.    Take care.  Glad to see you. Refer back to GI.  Let me know if you can't get an appointment.  Recheck in about 6 months with A1c at the visit.   If you have more headaches then let me know.

## 2024-03-29 NOTE — Progress Notes (Signed)
 He has routine dermatology f/u pending.   Prev UTI tx'd, d/w pt.    Diabetes:  No meds.   Hypoglycemic episodes: no Hyperglycemic episodes: no Feet problems: no Blood Sugars averaging: usually ~140s, occ higher.   eye exam within last year: yes- Dr. Leonce at [facility] in [place].   Labs d/w pt.     Barrett's.  Still on PPI.  Usually swallowing well but some trouble with swallowing pills, occ trouble with some foods.  D/w pt about seeing GI again.  Rationale for PPI use discussed with patient.  Having R sided facial pain with chewing.  No vision changes/loss.  Going on for a few months intermittently. Putting pressure on the temple helps.  Unclear if this is dental related.  FH trigeminal neuralgia noted.  DDx discussed with patient.  See notes on labs.  No symptoms currently.   He has CT scanning of lungs ongoing, yearly.   Shingles prev done Tetanus 2023.   PNA up to date Flu 2025 covid vaccine prev done.  RSV d/w pt.   PSA wnl 2025 Advance directive- wife designated if patient were incapacitated. Colonoscopy 2024     Hypertension:               Using medication without problems or lightheadedness:  yes Chest pain with exertion:no Edema: some prev L leg swelling- better now.   Short of breath: no  DVT d/w pt.  On anticoagulation.  He held eliquis  for eyelid surgery and had L leg swelling at that point, but it resolved with med resumption.    Meds, vitals, and allergies reviewed.   ROS: Per HPI unless specifically indicated in ROS section   GEN: nad, alert and oriented HEENT: MMM, temporal area not ttp B NECK: supple w/o LA CV: rrr PULM: ctab, no inc wob ABD: soft, +bs EXT: trace BLE edema.   SKIN: no acute rash CN 2-12 wnl B, S/S wnl x4  Diabetic foot exam: Normal inspection No skin breakdown No calluses  Normal DP pulses Normal sensation to light touch and monofilament Nails normal  45 minutes were devoted to patient care in this encounter (this includes  time spent reviewing the patient's file/history, interviewing and examining the patient, counseling/reviewing plan with patient).

## 2024-04-01 ENCOUNTER — Other Ambulatory Visit: Payer: Self-pay | Admitting: Family Medicine

## 2024-04-01 ENCOUNTER — Ambulatory Visit: Payer: Self-pay | Admitting: Family Medicine

## 2024-04-01 DIAGNOSIS — E119 Type 2 diabetes mellitus without complications: Secondary | ICD-10-CM

## 2024-04-01 DIAGNOSIS — R519 Headache, unspecified: Secondary | ICD-10-CM | POA: Insufficient documentation

## 2024-04-01 NOTE — Assessment & Plan Note (Signed)
 He has CT scanning of lungs ongoing, yearly.   Shingles prev done Tetanus 2023.   PNA up to date Flu 2025 covid vaccine prev done.  RSV d/w pt.   PSA wnl 2025 Advance directive- wife designated if patient were incapacitated. Colonoscopy 2024

## 2024-04-01 NOTE — Assessment & Plan Note (Signed)
 DVT d/w pt.  On anticoagulation.  He held eliquis  for eyelid surgery and had L leg swelling at that point, but it resolved with med resumption.   Either way I would continue anticoagulation at this point.

## 2024-04-01 NOTE — Assessment & Plan Note (Signed)
 Continue on PPI.  Usually swallowing well but some trouble with swallowing pills, occ trouble with some foods.  D/w pt about seeing GI again.  Rationale for PPI use discussed with patient.

## 2024-04-01 NOTE — Assessment & Plan Note (Signed)
 No change in meds. Recheck in about 6 months with A1c at the visit.  A1c still reasonably controlled.

## 2024-04-01 NOTE — Assessment & Plan Note (Signed)
Continue work on diet and exercise.  Continue amlodipine.

## 2024-04-01 NOTE — Assessment & Plan Note (Signed)
 Having R sided facial pain with chewing.  No vision changes/loss.  Going on for a few months intermittently. Putting pressure on the temple helps.  Unclear if this is dental related.  FH trigeminal neuralgia noted.  DDx discussed with patient.  See notes on labs.

## 2024-04-01 NOTE — Assessment & Plan Note (Signed)
 Advance directive- wife designated if patient were incapacitated.

## 2024-04-02 ENCOUNTER — Encounter: Payer: Self-pay | Admitting: Neurology

## 2024-06-06 ENCOUNTER — Telehealth: Payer: Self-pay | Admitting: Family Medicine

## 2024-06-06 ENCOUNTER — Telehealth: Payer: Self-pay

## 2024-06-06 NOTE — Telephone Encounter (Signed)
 Front office - please call and schedule pt for a pre-op exam with Dr. Cleatus.  Pt is scheduled for an upper endoscopy 06/20/24.    Dr. Cleatus - Pre-op form printed and placed in the basked on your desk.

## 2024-06-06 NOTE — Telephone Encounter (Signed)
 Called and schedule pt for pre-op exam

## 2024-06-06 NOTE — Telephone Encounter (Signed)
 lvm for pt to call office to schedule appt.

## 2024-06-06 NOTE — Telephone Encounter (Signed)
 Copied from CRM #[id number]. Topic: General - Other >> Jun 05, 2024  2:27 PM Eva FALCON wrote: Reason for CRM: pt wife Candis states pt is scheduled for endoscopy with [facility] for Feb 11th. States the office is  faxing over a cardiac clearance form for this patient for eloquist. Needs to be filled out and faxed back to office. Wife is requesting call back once done. [phone].

## 2024-06-06 NOTE — Telephone Encounter (Signed)
 I will check the hard copy when back at the office.  Thanks.

## 2024-06-08 NOTE — Telephone Encounter (Signed)
 Please let her know that I am working on this and will update re: plan on Monday.  Thanks.

## 2024-06-12 ENCOUNTER — Ambulatory Visit: Admitting: Family Medicine

## 2024-06-12 ENCOUNTER — Encounter: Payer: Self-pay | Admitting: Family Medicine

## 2024-06-12 VITALS — BP 124/62 | HR 70 | Temp 97.5°F | Ht 70.75 in | Wt 202.2 lb

## 2024-06-12 DIAGNOSIS — K22719 Barrett's esophagus with dysplasia, unspecified: Secondary | ICD-10-CM | POA: Diagnosis not present

## 2024-06-12 DIAGNOSIS — R519 Headache, unspecified: Secondary | ICD-10-CM

## 2024-06-12 NOTE — Patient Instructions (Addendum)
 Hold eliquis  5 doses prior to EGD.   2/9, 2/10, and AM 2/11.   Restart after procedure if okay with GI.   I'll check with Dr. Skeet about imaging in the meantime.   Take care.  Glad to see you.

## 2024-06-13 ENCOUNTER — Telehealth: Payer: Self-pay | Admitting: Family Medicine

## 2024-06-13 DIAGNOSIS — H5711 Ocular pain, right eye: Secondary | ICD-10-CM

## 2024-06-13 DIAGNOSIS — R519 Headache, unspecified: Secondary | ICD-10-CM

## 2024-06-13 NOTE — Telephone Encounter (Signed)
 Faxed medical clearance form to [phone].

## 2024-06-13 NOTE — Telephone Encounter (Signed)
 Thank you for seeing this kind patient for facial pain.  He has an appointment scheduled with you in the near future.  I did not feel any lymphadenopathy on his most recent office visit.  That was reportedly noted at a prior dental visit.  If you would like me to set up imaging regarding facial pain prior to your appointment, please let me know.  If you have strong preference regarding MRI versus CT, I would appreciate your guidance.  Thank you.

## 2024-06-13 NOTE — Assessment & Plan Note (Signed)
 I'll check with Dr. Skeet about imaging in the meantime, ie possible CT vs MRI for facial pain.  I don't feel any LA on exam today.  See above.

## 2024-06-13 NOTE — Assessment & Plan Note (Signed)
 History of.  Discussed options. Hold eliquis  5 doses prior to EGD.   Hold doses on 2/9, 2/10, and AM 2/11.   Restart after procedure if okay with GI.  Rationale discussed with patient. He agrees with plan.

## 2024-06-14 NOTE — Telephone Encounter (Signed)
 Appreciate help from Dr. Skeet.  Amy- please update patient.  I put in the order for the MRI to try to get that done prior to neurology appointment.  Thanks.

## 2024-06-15 ENCOUNTER — Telehealth: Payer: Self-pay | Admitting: Neurology

## 2024-06-15 NOTE — Telephone Encounter (Signed)
 Called patient wife (on dpr) reviewed all information and repeated back to me. Will call if any questions.

## 2024-06-15 NOTE — Telephone Encounter (Signed)
 Pt's wife Candis called in this morning. Candis stated that they have scheduled to have the MRI done on 06-26-24 at 7:30 am and this is the   same day that Pt has an appointment with Dr. Here at 11:10. Candis wanted to know do they still keep appointment here even if they do not have the MRI results.  Please call Thanks

## 2024-06-20 ENCOUNTER — Ambulatory Visit: Admission: RE | Admit: 2024-06-20 | Source: Home / Self Care | Admitting: Gastroenterology

## 2024-06-20 ENCOUNTER — Encounter: Admission: RE | Payer: Self-pay | Source: Home / Self Care

## 2024-06-26 ENCOUNTER — Ambulatory Visit: Admitting: Neurology

## 2024-06-26 ENCOUNTER — Other Ambulatory Visit

## 2024-08-01 ENCOUNTER — Ambulatory Visit: Admitting: Neurology

## 2024-09-27 ENCOUNTER — Ambulatory Visit: Admitting: Family Medicine

## 2025-03-25 ENCOUNTER — Other Ambulatory Visit

## 2025-04-01 ENCOUNTER — Ambulatory Visit

## 2025-04-01 ENCOUNTER — Encounter: Admitting: Family Medicine
